# Patient Record
Sex: Male | Born: 1945 | Race: White | Hispanic: No | Marital: Married | Smoking: Former smoker
Health system: Southern US, Community
[De-identification: ages and names within clinical notes are randomized; demographics above are authoritative.]

## PROBLEM LIST (undated history)

## (undated) DIAGNOSIS — R9430 Abnormal result of cardiovascular function study, unspecified: Secondary | ICD-10-CM

## (undated) DIAGNOSIS — L4050 Arthropathic psoriasis, unspecified: Secondary | ICD-10-CM

## (undated) DIAGNOSIS — E785 Hyperlipidemia, unspecified: Secondary | ICD-10-CM

## (undated) DIAGNOSIS — M1990 Unspecified osteoarthritis, unspecified site: Secondary | ICD-10-CM

## (undated) DIAGNOSIS — Z8489 Family history of other specified conditions: Secondary | ICD-10-CM

## (undated) DIAGNOSIS — K5792 Diverticulitis of intestine, part unspecified, without perforation or abscess without bleeding: Secondary | ICD-10-CM

## (undated) DIAGNOSIS — I739 Peripheral vascular disease, unspecified: Secondary | ICD-10-CM

## (undated) DIAGNOSIS — I779 Disorder of arteries and arterioles, unspecified: Secondary | ICD-10-CM

## (undated) DIAGNOSIS — Z01810 Encounter for preprocedural cardiovascular examination: Secondary | ICD-10-CM

## (undated) DIAGNOSIS — I493 Ventricular premature depolarization: Secondary | ICD-10-CM

## (undated) DIAGNOSIS — IMO0002 Reserved for concepts with insufficient information to code with codable children: Secondary | ICD-10-CM

## (undated) DIAGNOSIS — Q159 Congenital malformation of eye, unspecified: Secondary | ICD-10-CM

## (undated) DIAGNOSIS — K429 Umbilical hernia without obstruction or gangrene: Secondary | ICD-10-CM

## (undated) DIAGNOSIS — I2510 Atherosclerotic heart disease of native coronary artery without angina pectoris: Secondary | ICD-10-CM

## (undated) HISTORY — DX: Abnormal result of cardiovascular function study, unspecified: R94.30

## (undated) HISTORY — PX: ANGIOPLASTY: SHX39

## (undated) HISTORY — DX: Diverticulitis of intestine, part unspecified, without perforation or abscess without bleeding: K57.92

## (undated) HISTORY — DX: Ventricular premature depolarization: I49.3

## (undated) HISTORY — DX: Atherosclerotic heart disease of native coronary artery without angina pectoris: I25.10

## (undated) HISTORY — DX: Encounter for preprocedural cardiovascular examination: Z01.810

## (undated) HISTORY — DX: Reserved for concepts with insufficient information to code with codable children: IMO0002

## (undated) HISTORY — DX: Arthropathic psoriasis, unspecified: L40.50

## (undated) HISTORY — DX: Unspecified osteoarthritis, unspecified site: M19.90

## (undated) HISTORY — PX: OTHER SURGICAL HISTORY: SHX169

## (undated) HISTORY — DX: Umbilical hernia without obstruction or gangrene: K42.9

## (undated) HISTORY — DX: Peripheral vascular disease, unspecified: I73.9

## (undated) HISTORY — DX: Hyperlipidemia, unspecified: E78.5

## (undated) HISTORY — DX: Disorder of arteries and arterioles, unspecified: I77.9

---

## 1988-11-17 HISTORY — PX: OTHER SURGICAL HISTORY: SHX169

## 1999-03-20 HISTORY — PX: CARDIAC CATHETERIZATION: SHX172

## 1999-12-20 ENCOUNTER — Inpatient Hospital Stay (HOSPITAL_COMMUNITY): Admission: EM | Admit: 1999-12-20 | Discharge: 1999-12-23 | Payer: Self-pay | Admitting: Emergency Medicine

## 1999-12-21 ENCOUNTER — Encounter: Payer: Self-pay | Admitting: Internal Medicine

## 2000-03-18 ENCOUNTER — Encounter: Payer: Self-pay | Admitting: Emergency Medicine

## 2000-03-18 ENCOUNTER — Inpatient Hospital Stay (HOSPITAL_COMMUNITY): Admission: EM | Admit: 2000-03-18 | Discharge: 2000-03-21 | Payer: Self-pay | Admitting: Emergency Medicine

## 2002-09-02 ENCOUNTER — Encounter: Payer: Self-pay | Admitting: Orthopedic Surgery

## 2002-09-02 ENCOUNTER — Ambulatory Visit (HOSPITAL_COMMUNITY): Admission: RE | Admit: 2002-09-02 | Discharge: 2002-09-02 | Payer: Self-pay | Admitting: Orthopedic Surgery

## 2004-12-04 ENCOUNTER — Ambulatory Visit: Payer: Self-pay | Admitting: Cardiology

## 2005-01-05 ENCOUNTER — Ambulatory Visit: Payer: Self-pay

## 2005-07-16 ENCOUNTER — Emergency Department (HOSPITAL_COMMUNITY): Admission: EM | Admit: 2005-07-16 | Discharge: 2005-07-16 | Payer: Self-pay | Admitting: *Deleted

## 2005-07-24 ENCOUNTER — Ambulatory Visit (HOSPITAL_COMMUNITY): Admission: RE | Admit: 2005-07-24 | Discharge: 2005-07-24 | Payer: Self-pay | Admitting: Internal Medicine

## 2005-12-10 ENCOUNTER — Ambulatory Visit: Payer: Self-pay | Admitting: Cardiology

## 2007-01-07 ENCOUNTER — Ambulatory Visit: Payer: Self-pay | Admitting: Cardiology

## 2007-04-04 ENCOUNTER — Ambulatory Visit (HOSPITAL_COMMUNITY): Admission: RE | Admit: 2007-04-04 | Discharge: 2007-04-04 | Payer: Self-pay | Admitting: *Deleted

## 2007-09-07 ENCOUNTER — Encounter
Admission: RE | Admit: 2007-09-07 | Discharge: 2007-09-07 | Payer: Self-pay | Admitting: Physical Medicine and Rehabilitation

## 2007-11-19 ENCOUNTER — Ambulatory Visit: Payer: Self-pay | Admitting: Cardiology

## 2007-11-26 ENCOUNTER — Ambulatory Visit (HOSPITAL_COMMUNITY): Admission: RE | Admit: 2007-11-26 | Discharge: 2007-11-26 | Payer: Self-pay | Admitting: Neurosurgery

## 2008-12-04 ENCOUNTER — Encounter: Payer: Self-pay | Admitting: Cardiology

## 2008-12-06 ENCOUNTER — Ambulatory Visit: Payer: Self-pay | Admitting: Cardiology

## 2009-01-21 ENCOUNTER — Ambulatory Visit (HOSPITAL_COMMUNITY): Admission: RE | Admit: 2009-01-21 | Discharge: 2009-01-21 | Payer: Self-pay | Admitting: Orthopedic Surgery

## 2009-04-08 ENCOUNTER — Telehealth (INDEPENDENT_AMBULATORY_CARE_PROVIDER_SITE_OTHER): Payer: Self-pay | Admitting: *Deleted

## 2009-04-11 ENCOUNTER — Encounter: Admission: RE | Admit: 2009-04-11 | Discharge: 2009-04-11 | Payer: Self-pay | Admitting: Internal Medicine

## 2009-05-03 ENCOUNTER — Telehealth (INDEPENDENT_AMBULATORY_CARE_PROVIDER_SITE_OTHER): Payer: Self-pay | Admitting: *Deleted

## 2009-05-18 ENCOUNTER — Telehealth (INDEPENDENT_AMBULATORY_CARE_PROVIDER_SITE_OTHER): Payer: Self-pay | Admitting: *Deleted

## 2009-07-20 ENCOUNTER — Telehealth: Payer: Self-pay | Admitting: Cardiology

## 2009-08-08 ENCOUNTER — Telehealth (INDEPENDENT_AMBULATORY_CARE_PROVIDER_SITE_OTHER): Payer: Self-pay | Admitting: *Deleted

## 2009-11-17 ENCOUNTER — Ambulatory Visit: Payer: Self-pay | Admitting: Cardiology

## 2009-11-22 ENCOUNTER — Encounter: Payer: Self-pay | Admitting: Cardiology

## 2009-11-23 ENCOUNTER — Telehealth (INDEPENDENT_AMBULATORY_CARE_PROVIDER_SITE_OTHER): Payer: Self-pay | Admitting: *Deleted

## 2009-11-24 ENCOUNTER — Encounter: Payer: Self-pay | Admitting: Cardiology

## 2009-11-24 ENCOUNTER — Ambulatory Visit: Payer: Self-pay

## 2009-11-24 ENCOUNTER — Encounter (HOSPITAL_COMMUNITY): Admission: RE | Admit: 2009-11-24 | Discharge: 2009-12-06 | Payer: Self-pay | Admitting: Cardiology

## 2009-11-24 ENCOUNTER — Ambulatory Visit: Payer: Self-pay | Admitting: Cardiology

## 2009-11-28 ENCOUNTER — Encounter: Payer: Self-pay | Admitting: Cardiology

## 2010-04-19 ENCOUNTER — Encounter: Payer: Self-pay | Admitting: Cardiology

## 2010-04-20 NOTE — Progress Notes (Signed)
Summary: Nuc Pre-Procedure  Phone Note Outgoing Call Call back at [facility] Phone [phone]   Call placed by: Antionette Char RN,  November 23, 2009 3:30 PM Call placed to: Patient Reason for Call: Confirm/change Appt Summary of Call: Left message with information on Myoview Information Sheet (see scanned document for details).     Nuclear Med Background Indications for Stress Test: Evaluation for Ischemia, PTCA Patency, Abnormal EKG  Indications Comments: Freq. PVC's  History: Angioplasty, Heart Catheterization, Myocardial Perfusion Study  History Comments: 2006 MPS-Neg. Ischemia, Inferior Thinning     Nuclear Pre-Procedure Cardiac Risk Factors: History of Smoking, Lipids Height (in): 69

## 2010-04-20 NOTE — Progress Notes (Signed)
Summary: Records request from [facility]  Request for records received from [facility]. Request forwarded to [employer]. Wilder Glade  May 03, 2009 5:07 PM

## 2010-04-20 NOTE — Assessment & Plan Note (Signed)
Summary: Jorge [name]    Visit Type:  Chart update Primary Drena Ham:  [name], MD   History of Present Illness: I have reviewed my recent clinical note an office note and the patient's stress nuclear scan.  He walked adequately on a treadmill.  He did have PVCs although they decreased at higher level of stress.  There were scattered PVCs.  However the rates were very slow.  At one point he did have 4 consecutive wide complex beats at a slow rate.  Nuclear scan raised the question of very slight ischemia.  The patient is not having significant symptoms.  After careful review I feel that this shows that he is stable and that we do not need to do any further intervention at this time.  I'll see him back in one year.  Allergies: 1)  ! Niacin

## 2010-04-20 NOTE — Progress Notes (Signed)
   Records request recieved from [facility] sent to [facility]  Aug 08, 2009 9:37 AM    Appended Document:  Request recieved from [facility] sent to [facility]

## 2010-04-20 NOTE — Assessment & Plan Note (Signed)
Summary: f1y  Medications Added ZETIA 10 MG TABS (EZETIMIBE) Take one tablet by mouth daily.      Allergies Added: NKDA  Visit Type:  Follow-up Primary Provider:  [name], MD  CC:  CAD.  History of Present Illness: The patient is seen for followup coronary artery disease.  He has been stable.  He did not have any chest pain or shortness of breath.  His last exercise test was in 2006.  He had some type of imaging study done recently and was not exercise test.  This was ordered by Dr. Renne Crigler and we will obtain the results.  Current Medications (verified): 1)  Plavix 75 Mg Tabs (Clopidogrel Bisulfate) .... Take One Tablet By Mouth Daily 2)  Metoprolol Succinate 25 Mg Xr24h-Tab (Metoprolol Succinate) .... Take One-Half Tablet By Mouth Daily 3)  Lipitor 80 Mg Tabs (Atorvastatin Calcium) .... Take One-Half Tablet By Mouth Daily. 4)  Aspirin 81 Mg Tbec (Aspirin) .... Take One Tablet By Mouth Daily 5)  Fish Oil   Oil (Fish Oil) .... 1200mg  Once Daily 6)  Multivitamins   Tabs (Multiple Vitamin) .... Take One Tablet By Mouth Once Daily. 7)  Zetia 10 Mg Tabs (Ezetimibe) .... Take One Tablet By Mouth Daily.  Allergies (verified): No Known Drug Allergies  Past History:  Past Medical History: HYPERLIPIDEMIA-MIXED (ICD-272.4) CAD...  PCI diagonal with redo PCI diagonal 2001..residual LAD 60%  /   Myoview 2006 no ischemic LV... normal function Carpal tunnel surgery PVCs   August, 2011    Review of Systems       Patient denies fever, chills, headache, sweats, rash, change in vision, change in hearing, chest pain, cough, nausea vomiting, urinary symptoms.  All other systems are reviewed and are negative.  Vital Signs:  Patient profile:   65 year old male Height:      69 inches Weight:      215 pounds BMI:     31.86 Pulse rate:   81 / minute BP sitting:   118 / 78  (left arm) Cuff size:   regular  Vitals Entered By: Hardin Negus, RMA (November 17, 2009 9:29 AM)  Physical  Exam  General:  patient is stable today. Head:  head is atraumatic. Eyes:  no xanthelasma. Neck:  no jugular venous attention. Chest Wall:  no chest wall tenderness.  Her Lungs:  lungs are clear respiratory effort is not labored. Heart:  cardiac exam reveals S1-S2.  No clicks or significant murmurs. Abdomen:  abdomen is soft. Msk:  no musculoskeletal deformities. Extremities:  no peripheral edema. Skin:  no skin rashes. Psych:  patient is oriented to person time and place.  Affect is normal.   Impression & Recommendations:  Problem # 1:  * PVCS The patient has multiple PVCs on his EKG today.  Historically he has not had any problems with syncope or presyncope.  We will proceed with a stress Myoview scan.  It will be important for him to walk on the treadmill so that I can see what happens to his PVCs when he walks.  Problem # 2:  HYPERLIPIDEMIA-MIXED (ICD-272.4)  His updated medication list for this problem includes:    Lipitor 80 Mg Tabs (Atorvastatin calcium) .Marland Kitchen... Take one-half tablet by mouth daily.    Zetia 10 Mg Tabs (Ezetimibe) .Marland Kitchen... Take one tablet by mouth daily. Patient's lipids are being treated.  No change in therapy.  Problem # 3:  CAD, UNSPECIFIED SITE (ICD-414.00)  His updated medication list for this  problem includes:    Plavix 75 Mg Tabs (Clopidogrel bisulfate) .Marland Kitchen... Take one tablet by mouth daily    Metoprolol Succinate 25 Mg Xr24h-tab (Metoprolol succinate) .Marland Kitchen... Take one-half tablet by mouth daily    Aspirin 81 Mg Tbec (Aspirin) .Marland Kitchen... Take one tablet by mouth daily   Coronary disease is stable.  The patient's last exercise test was in 2006.  It is time to do an exercise test to reassess.  He did have a PCI to the diagonal and 2001 with residual 60% LAD disease.  EKG is done today and reviewed by me.  The intrinsic QRS has not changed.  There is sinus bradycardia with multiple PVCs.  Stress nuclear scan walking on the treadmill will be arranged.  I will contact  him with the results.  Orders: EKG w/ Interpretation (93000) Nuclear Stress Test (Nuc Stress Test)  Patient Instructions: 1)  Your physician recommends that you continue on your current medications as directed. Please refer to the Current Medication list given to you today. 2)  Your physician wants you to follow-up in: 1 YEAR  You will receive a reminder letter in the mail two months in advance. If you don't receive a letter, please call our office to schedule the follow-up appointment. 3)  Your physician has requested that you have an exercise stress myoview.  For further information please visit [email or web address]  Please follow instruction sheet, as given.

## 2010-04-20 NOTE — Progress Notes (Signed)
   Rec. Request from [facility] forwarded to [facility] for processing [facility]  April 08, 2009 12:40 PM

## 2010-04-20 NOTE — Assessment & Plan Note (Signed)
Summary: Cardiology Nuclear Testing  Nuclear Med Background Indications for Stress Test: Evaluation for Ischemia, PTCA Patency, Abnormal EKG  Indications Comments: Freq. PVC's  History: Angioplasty, Heart Catheterization, Myocardial Perfusion Study  History Comments: 2006 MPS-Neg. Ischemia, Inferior Thinning  Symptoms: Dizziness, DOE, Fatigue, Light-Headedness, Palpitations, SOB    Nuclear Pre-Procedure Cardiac Risk Factors: History of Smoking, Lipids Caffeine/Decaff Intake: None NPO After: 10:00 PM Lungs: clear IV 0.9% NS with Angio Cath: 22g     IV Site: R Antecubital IV Started by: Irean Hong, RN Chest Size (in) 44     Height (in): 70 Weight (lb): 212 BMI: 30.53 Tech Comments: Held metoprolol 24 hrs.  Nuclear Med Study 1 or 2 day study:  1 day     Stress Test Type:  Stress Reading MD:  Olga Millers, MD     Referring MD:  J.Katz Resting Radionuclide:  Technetium 76m Tetrofosmin     Resting Radionuclide Dose:  11.0 mCi  Stress Radionuclide:  Technetium 41m Tetrofosmin     Stress Radionuclide Dose:  33.0 mCi   Stress Protocol Exercise Time (min):  8:30 min     Max HR:  136 bpm     Predicted Max HR:  155 bpm  Max Systolic BP: 202 mm Hg     Percent Max HR:  87.74 %     METS: 10.40 Rate Pressure Product:  16109    Stress Test Technologist:  Milana Na, EMT-P     Nuclear Technologist:  Domenic Polite, CNMT  Rest Procedure  Myocardial perfusion imaging was performed at rest 45 minutes following the intravenous administration of Technetium 80m Tetrofosmin.  Stress Procedure  The patient exercised for 8:30.  The patient stopped due to fatigue, sob, and denied any chest pain.  There were no significant ST-T wave changes, freq pvcs, cuplets, triplets, bigemeny, trigemeny, and NSVT.  Technetium 51m Tetrofosmin was injected at peak exercise and myocardial perfusion imaging was performed after a brief delay.  QPS Raw Data Images:  Acquisition technically good; normal  left ventricular size. Stress Images:  There is decreased uptake in the inferior wall Rest Images:  There is decreased uptake in the inferior wall less prominent compared to the stress images. Subtraction (SDS):  These findings are consistent with prior inferior infarct and mild peri-infarct ischemia. Transient Ischemic Dilatation:  .98  (Normal <1.22)  Lung/Heart Ratio:  .34  (Normal <0.45)  Quantitative Gated Spect Images QGS cine images:  not gated   Overall Impression  Exercise Capacity: Fair exercise capacity. BP Response: Hypertensive blood pressure response. Clinical Symptoms: There is dyspnea. ECG Impression: No significant ST segment change suggestive of ischemia; frequent PVCs, occasional couplets and nonsustained VT (longest 4 beats). Overall Impression: Abnormal stress nuclear study with prior inferior infarct and mild peri-infarct ischemia.  Appended Document: Cardiology Nuclear Testing Let him know that study is OK. I will see him in a year.  Appended Document: Cardiology Nuclear Testing pts wife aware

## 2010-04-20 NOTE — Progress Notes (Signed)
Summary: Records request from [facility]  Request for records received from [facility]. Request forwarded to [facility]. Dena Chavis  May 18, 2009 11:47 AM

## 2010-04-20 NOTE — Progress Notes (Signed)
Summary: stress test   Phone Note Call from Patient Call back at Work Phone [phone] Call back at [phone]   Caller: Durward Mallard Reason for Call: Talk to Nurse Summary of Call: requesting stress test for life insurance, please call back Initial call taken by: Migdalia Dk,  Jul 20, 2009 1:38 PM  Follow-up for Phone Call        pt last seen in Sept, that ov note states see back in 1 year and will consider stress test at that time, will discuss w/Dr Boykin Nearing, RN  Jul 20, 2009 4:12 PM   per Dr Myrtis Ser ok for exercise myoview, called pt and left mess to call back Meredith Staggers, RN  Jul 21, 2009 2:42 PM   spoke w/pt he is now checking w/another ins. company and will call back when wants to get stress test Meredith Staggers, RN  Jul 25, 2009 3:32 PM

## 2010-04-20 NOTE — Miscellaneous (Signed)
  Clinical Lists Changes  Observations: Added new observation of CARDIO HPI: Data was received from Dr. Carolee Rota office.  Abdominal ultrasound January, 2011.... no abdominal aortic aneurysm, slight fatty infiltration of the liver.  Ultrasound of the hands... bilateral tenosynovitis, but no synovitis, or erosions.  Findings might suggest gonorrheal infection, lupus, spondyloarthropathy, but also possibly rheumatoid arthritis (11/22/2009 14:20) Added new observation of PRIMARY MD: [name], MD (11/22/2009 14:20)      Primary Provider:  [name], MD   History of Present Illness: Data was received from Dr. Carolee Rota office.  Abdominal ultrasound January, 2011.... no abdominal aortic aneurysm, slight fatty infiltration of the liver.  Ultrasound of the hands... bilateral tenosynovitis, but no synovitis, or erosions.  Findings might suggest gonorrheal infection, lupus, spondyloarthropathy, but also possibly rheumatoid arthritis

## 2010-06-25 ENCOUNTER — Other Ambulatory Visit: Payer: Self-pay | Admitting: Cardiology

## 2010-07-26 ENCOUNTER — Other Ambulatory Visit: Payer: Self-pay | Admitting: Cardiology

## 2010-07-31 ENCOUNTER — Other Ambulatory Visit: Payer: Self-pay | Admitting: Cardiology

## 2010-08-01 NOTE — Op Note (Signed)
NAMEAASHISH, HAMM             ACCOUNT NO.:  [id number]   MEDICAL RECORD NO.:  [id number]          PATIENT TYPE:  AMB   LOCATION:  ENDO                         FACILITY:  [facility]   PHYSICIAN:  Georgiana Spinner, M.D.    DATE OF BIRTH:  September 11, 1945   DATE OF PROCEDURE:  04/04/2007  DATE OF DISCHARGE:                               OPERATIVE REPORT   PROCEDURE:  Colonoscopy.   INDICATIONS:  Colon cancer screening.   ANESTHESIA:  Fentanyl 75 mcg, Versed 5 mg.   PROCEDURE:  With the patient mildly sedated in the left lateral  decubitus position, a rectal examination was attempted.  Subsequently  the Pentax videoscopic colonoscope was inserted in the rectum and passed  under direct vision to the cecum identified by ileocecal valve and  appendiceal orifice both of which were photographed. From this point the  colonoscope was slowly withdrawn taking circumferential views of colonic  mucosa, stopping only to photograph some diverticula seen in the sigmoid  colon until we reached the rectum which appeared normal on direct and  retroflexed view.  The endoscope was straightened and withdrawn.  The  patient's vital signs, pulse oximeter remained stable.  The patient  tolerated procedure well without apparent complications.   FINDINGS:  Diverticulosis, mild of sigmoid colon otherwise unremarkable  exam.   PLAN:  Consider repeat examination in 5-10 years.           ______________________________  Georgiana Spinner, M.D.     GMO/MEDQ  D:  04/04/2007  T:  04/04/2007  Job:  [id number]

## 2010-08-01 NOTE — Assessment & Plan Note (Signed)
[name] [name]                            CARDIOLOGY OFFICE NOTE   NAME:[name] [name] HOLT                    MRN:          [id number]  DATE:11/19/2007                            DOB:          1945/07/23    [name] [name] is doing well.  He has known coronary artery disease.  I  saw him last in October 2008.  He is not having any chest pain.  He is  not having any syncope or presyncope.  He has no shortness of breath.  He remains active.  His cholesterol is followed by Dr. Renne [name].   PAST MEDICAL HISTORY:   ALLERGIES:  The patient does not tolerate NIASPAN.   MEDICATIONS:  1. Lipitor 40.  2. Plavix 75.  3. Toprol 25.  4. Aspirin 81.   OTHER MEDICAL PROBLEMS:  See the list below.   REVIEW OF SYSTEMS:  Review of systems is negative other than the HPI.   PHYSICAL EXAMINATION:  VITAL SIGNS:  Blood pressure is 129/79.  Heart  rate is 68.  GENERAL:  The patient is oriented to person, time and place.  Affect is  normal.  HEENT:  No xanthelasma.  He has normal extraocular motion.  NECK:  There are no carotid bruits.  There is no jugular venous  distention.  LUNGS:  Clear.  Respiratory effort is not labored.  CARDIAC:  An S1 with an S2.  There are no clicks or significant murmurs.  ABDOMEN:  Soft.  EXTREMITIES:  He has no peripheral edema.   EKG reveals old mild diffuse J-point elevation.   PROBLEMS:  1. Coronary artery disease with PCI in the past followed by a redo PCI      to his diagonal in 2001.  A Myoview in 2006 with no ischemia.  2. Normal LV function.  3. Hyperlipidemia, being treated.  4. Plavix therapy.  His Plavix use now is elective.  It can be stopped      for his procedure that he needs for to release his carpal tunnel.      He can then resume his Plavix.   His overall status is stable.  Aggressive approach to his cholesterol of  this of course indicated.     Jorge Abed, MD, [facility]  Electronically Signed    JDK/MedQ  DD:  11/19/2007  DT: 11/20/2007  Job #: [id number]   cc:   [name] [name]. [name] [name], M.D.

## 2010-08-01 NOTE — Op Note (Signed)
NAME:  Jorge [name], Jorge [name]             ACCOUNT NO.:  [id number]   MEDICAL RECORD NO.:  [id number]          PATIENT TYPE:  AMB   LOCATION:  SDS                          FACILITY:  [facility]   PHYSICIAN:  Payton Doughty, M.D.      DATE OF BIRTH:  1945/06/22   DATE OF PROCEDURE:  11/26/2007  DATE OF DISCHARGE:  11/26/2007                               OPERATIVE REPORT   PREOPERATIVE DIAGNOSIS:  Left carpal tunnel syndrome.   POSTOPERATIVE DIAGNOSIS:  Left carpal tunnel syndrome.   PROCEDURE:  Left carpal tunnel release.   SURGEON:  Payton Doughty, MD   ANESTHESIA:  General endotracheal.   PREPARATION:  Prepped and draped with alcohol wipe.   COMPLICATIONS:  None.   NURSE ASSISTANT:  [place].   BODY OF TEXT:  This is a 65 year old gentleman with left carpal tunnel  syndrome taken to operating room lightly sedated.  Following shave,  prep, and drape in the usual sterile fashion, skin was infiltrated with  1% lidocaine.  A 2-cm incision was made just distal to the most distal  wrist crease on line between the third and fourth rays.  Subcutaneous  fat was dissected and the transverse carpal ligament was easily  identified.  It was divided with a blade until the nerve was exposed and  then working superficial and deep to the ligament.  The ligament was  divided with the scissors down on the wrist, not proximally on the palm.  Good decompression was achieved.  Wound was irrigated.  Hemostasis  assured.  A single layer of 3-0 nylon in an interrupted vertical  mattress fashion to close the incision.  Dressed with Betadine, Telfa,  and wrapped in a fluffy wrap.  The patient returned to the recovery room  in good condition.           ______________________________  Payton Doughty, M.D.     MWR/MEDQ  D:  11/26/2007  T:  11/26/2007  Job:  [id number]

## 2010-08-01 NOTE — Assessment & Plan Note (Signed)
[facility]                            CARDIOLOGY OFFICE NOTE   NAME:Jorge [name]                    MRN:          [id number]  DATE:01/07/2007                            DOB:          [birth year]    [name] is doing very well. He has coronary disease. He had  diagonal disease and then re-do in 2002. At that time, he had a PTCA  with cutting balloon of the first diagonal with a 95% stenosis in the  body of the vessel reduced to 25% and an 80% stenosis in the ostium  reduced to 40%. If he has recurrent problems a small stent could be  placed in the body of the vessel. However, this has not been necessary.  Based on this anatomy and the fact that he has remained quite stable and  has no problems with Plavix and is able to obtain his Plavix, I continue  to keep him on Plavix. I have lowered his aspirin dose from 325 to 81,  although he has not finished his supply of 325 yet. He will change to 81  at some point.   PAST MEDICAL HISTORY:   ALLERGIES:  NIACIN.   MEDICATIONS:  1. Aspirin.  2. Lipitor.  3. Plavix.  4. Toprol XL.   OTHER MEDICAL PROBLEMS:  See the list below.   REVIEW OF SYSTEMS:  He feels fine and has no significant complaints.   PHYSICAL EXAMINATION:  Blood pressure 130/72 with a pulse of 67. The  patient is oriented to person, time and place and his affect is normal.  HEENT: Reveals no xanthelasma. He has normal extraocular motion. There  are no carotid bruits. There is no jugular venous distention.  LUNGS:  Are clear. Respiratory effort is not labored.  CARDIAC: Reveals an S1, with an S2. He has no clicks or significant  murmurs.  ABDOMEN: Soft. There are no masses or bruits. He has normal bowel  sounds.  He has no peripheral edema. He has 2+ distal pulses. There are no  musculoskeletal deformities.   EKG: Reveals sinus rhythm. He does have scattered PVCs and we have seen  these in the past.   PROBLEM LIST:  1.  Coronary disease with interventions in the past and re-do to his      diagonal in 2001. Myoview scan in 2006 revealed no significant      ischemia.  2. Normal left ventricular function.  3. Hyperlipidemia, on medications. With current guidelines, we would      like for his LDL to be in the range of 70.  4. Plavix therapy. As outlined above, he will remain on Plavix      longterm at this point and his aspirin can be reduced to 81 mg.   I will see him back in one year.     Luis Abed, MD, [facility]  Electronically Signed    JDK/MedQ  DD: 01/07/2007  DT: 01/07/2007  Job #: [id number]   cc:   Soyla Murphy. Renne Crigler, M.D.

## 2010-08-04 NOTE — Cardiovascular Report (Signed)
[place]. [facility]  Patient:    Jorge [name], Jorge [name]                  MRN: [id number] Proc. Date: 12/22/99 Adm. Date:  [id number] Attending:  Pricilla Riffle CC:         Soyla Murphy. Renne Crigler, M.D.  Elvina Sidle, M.D.  Luis Abed, M.D. [facility]   Cardiac Catheterization  PROCEDURES PERFORMED:  Cardiac catheterization, rotational atherectomy, cutting balloon angioplasty, and intravascular ultrasound.  CLINICAL HISTORY:  [name] is 65 years old and had recent onset of chest pain suggestive of angina.  He was hospitalized at [facility] after being seen by Dr. Milus Glazier and had a Cardiolite scan which showed anterolateral and apical ischemia.  He was scheduled for evaluation with angiography.  PROCEDURE:  The procedure was performed via the right femoral artery using arterial sheath and 6-French preformed coronary catheters.  A front wall arterial puncture was performed and Omnipaque contrast was used.  After completion of the diagnostic study, we made the decision to intervene on the diagonal branch of the LAD.  We switched to Hexabrix contrast.  The was present for the was given weight-adjusted heparin to prolong the ACT to greater than 200 seconds and was given double-bolus Integrilin and infusion. We used an 8-French JL-4 guiding catheter and initially went in with a rotofloppy wire and were able to pass this into the diagnonal branch without too much difficulty.  Before we started any bur runs, he became hypotensive and bradycardic and we had to give Atropine and Dopamine and was placed a temporary transvenous pacemaker via the right femoral vein.  His hemodynamics and rhythms stabilized and we were able to do bur runs with a 1.5 bur.  We performed a total of nine runs at approximately 145,000 rpm for 9 seconds to 15 seconds across the ostium and proximal lesion in the diagonal branch of the LAD.  We then removed the rotofloppy wire and  passed a new long BMW wire down the diagonal branch and we went in with a 2.5 mm x 15 mm cutting balloon and performed a total of four inflations of up to 10 atm x 42 seconds, covering both the ostium and the proximal lesion in the diagonal branch of the LAD.  We then rerouted the wire down the LAD and performed an IVUS run with an Atlantis catheter.  This demonstrated that there was a satisfactory lumen just distal to the diagonal branch and based on this data, we elected not to intervene on the LAD.  The patient tolerated the procedure well and left the laboratory in satisfactory condition.  RESULTS: 1.  The left main coronary artery was free of significant disease.  2.  The left anterior descending artery gave rise to a large septal     perforator, a large diagonal branch, and a second large diagonal branch     which arose distally.  There was 80% ostial narrowing in the diagonal     branch and 95% proximal narrowing in the diagonal branch.  There was     moderately heavy calcification in the diagonal branch, as well as in the     LAD.  There was 60% narrowing just distal to the diagonal branch in the     LAD, with irregularities in the proximal LAD.  3.  The circumflex artery gave rise to a large intermediate branch, a small     marginal branch, and two posterolateral branches.  This vessel had some     irregularities, but no major obstruction.  4.  The right coronary artery was a small to moderate sized vessel that gave     rise to a conus branch, a right ventricular branch, and a posterior     descending branch.  There was 40% proximal and 40% mid stenosis in the     right coronary artery.  LEFT VENTRICULOGRAM:  The left ventriculogram performed in the RAO projection showed good wall motion with no areas of hypokinesis.  The estimated ejection fraction was 60%.  Following rotational atherectomy and cutting balloon angioplasty of the ostial lesion in the diagonal branch of the  LAD, the stenosis improved from 80% to 40%.  Following rotational atherectomy and cutting balloon angioplasty of the proximal lesion in the diagonal branch, the stenosis improved from 95% to less than 10%.  The IVUS run on the LAD showed a distal reference lumen beyond the diagonal branch of 2.5 mm x 3.5 mm.  The lumen just distal to the diagonal branch was 2.1 mm x 2.1 mm in a vessel that was approximately 3.5 mm x 3.5 mm.  Proximal to the diagonal branch the lumen was larger at 2.8 mm x 2.7 mm just proximal to the diagonal branch.  HEMODYNAMIC DATA:  The aortic pressure was 119/72, with a mean of 93.  Left ventricular pressure was 119/11.  CONCLUSIONS: 1.  Coronary artery disease with 80% ostial and 95% proximal stenosis in the     diagonal branch of the left anterior descending artery, 60% stenosis in     the proximal left anterior descending artery just after the diagonal     branch, mild irregularities in the circumflex system, and 40% proximal and     40% mid stenosis in the right coronary artery with normal left ventricular     function. 2.  Successful rotational atherectomy and cutting balloon angioplasty of the     diagonal branch of the left anterior descending artery with improvement in     the ostial stenosis from 80% to 40% and improvement in the proximal     stenosis from 95% to less than 10%. 3.  Residual disease in the left anterior descending artery with a lumen of     2.1 mm x 2.1 mm distal to the diagonal branch and estimated angiographic     stenosis of 60%.  DISPOSITION:  The was present for the was returned to the postangioplasty unit for further observation. DD:  12/22/99 TD:  12/23/99 Job: [id number]

## 2010-08-04 NOTE — Discharge Summary (Signed)
[place]. [facility]  Patient:    Jorge [name], Jorge [name] Visit Number: [id number] MRN: [id number]          Service Type: MED Location: [id number] Attending Physician:  Talitha Givens Dictated by:   Abelino Derrick, P.A.-C. [facility] Admit Date:  03/18/2000 Disc. Date: 03/21/00   CC:         Luis Abed, M.D. [facility]             Soyla Murphy. Renne Crigler, M.D.                           Discharge Summary  DISCHARGE DIAGNOSES: 1. Coronary artery disease, status post high-speed rotational atherectomy    to the diagonal on December 22, 1999, with redo this admission. 2. Dyslipidemia, enrolled in the PROVE-IT trial.  HISTORY OF PRESENT ILLNESS:  The patient is a 65 year old male followed by Dr. Luis Abed and Dr. Soyla Murphy. Pharr, with a history of coronary artery disease as noted.  He developed substernal chest pain typical for angina.  He was admitted on March 18, 2000, for further evaluation.  Troponins and CPKs were negative.  He was started on Plavix and Toprol.  He was set up for a cardiac catheterization which was done on March 20, 2000, by Dr. Loraine Leriche Pulsipher.  This revealed an 80% and a 95% restenosis in the diagonal.  These were dilated using a cutting balloon.  He was put on Integrilin for 18 hours post-procedure.  He does mention that if he restenosed he may consider trying to place a stent.  DISPOSITION:  The patient is ambulated on March 21, 2000, and is ready for discharge.  DISCHARGE MEDICATIONS: 1. Coated aspirin q.d. 2. Toprol XL 25 mg 1/2 tablet q.d. 3. Nitroglycerin sublingual p.r.n. 4. PROVE-IT study drug.  LABORATORY DATA:  White count 9.3, hemoglobin 15.7, hematocrit 43.4, platelets 172.  Sodium 138, potassium 3.7, BUN 20, creatinine 1.0.  Liver function tests are normal.  Enzymes are negative.  TSH 2.66.  Chest x-ray showed no active disease.  Electrocardiogram showed a sinus bradycardia, nonspecific ST changes.  CONDITION ON  DISCHARGE:  The patient is discharged in stable condition.  FOLLOWUP:  The patient will follow up with Dr. Myrtis Ser.  The office will contact him regarding this. Dictated by:   Abelino Derrick, P.A.-C. [facility] Attending Physician:  Talitha Givens DD:  03/21/00 TD:  03/21/00 Job: [id number]

## 2010-08-04 NOTE — Discharge Summary (Signed)
[place]. [facility]  Patient:    Jorge [name], Jorge [name]                  MRN: [id number] Adm. Date:  [id number] Disc. Date: 12/23/99 Attending:  Pricilla Riffle Dictator:   Abelino Derrick, P.A.C. [facility] CC:         Soyla Murphy. Renne Crigler, M.D.   Discharge Summary  DISCHARGE DIAGNOSIS:  Coronary artery disease, status post diagonal high speed rotational atherectomy and angioplasty this admission.  HOSPITAL COURSE:  [name] is a 65 year old male with no prior cardiac history.  He smokes a pack and a half a day, but quit three years ago.  He presented on December 20, 1999, with chest pain consistent with unstable angina. The patient had no EKG changes.  Initial enzymes were negative.  He was set up for a stress Cardiolite.  This was abnormal and the patient was set up for cardiac catheterization which was done on December 22, 1999.  This revealed a 40% RCA, normal, but irregular circumflex, 80% diagonal lesion, and a 95% proximal to mid diagonal lesion.  He underwent high speed rotational atherectomy and angioplasty to both lesions with good final result.  The LAD had a 60% narrowing after the first diagonal.  Dr. Juanda Chance did not intervene on this.  The patient tolerated the procedure well and was stable for discharge on December 23, 1999.  DISCHARGE MEDICATIONS: 1. Coated aspirin once a day. 2. Nitroglycerin sublingual p.r.n.  LABORATORY DATA:  White count 7.5, hemoglobin 14.8, hematocrit 40.2, platelets 170.  INR 0.9, sodium 138, potassium 3.7, BUN 21, creatinine 1.0.  Lipid profile shows an HDL of 33 and LDL of 67.  TSH is 0.89.  EKG shows normal sinus rhythm 62.  Chest x-ray showed no acute disease.  DISPOSITION:  The patient was discharged in stable condition and will follow up with Dr. Myrtis Ser in about two weeks. DD:  12/23/99 TD:  12/23/99 Job: [id number]

## 2010-08-04 NOTE — Assessment & Plan Note (Signed)
[facility]                              CARDIOLOGY OFFICE NOTE   NAME:Clasby, JAYAN RAYMUNDO                    MRN:          [id number]  DATE:12/10/2005                            DOB:          June 17, 1945    Mr. Teague is doing very well.  He is not having any significant chest  pain.  He had an intervention in the past.  It was a diagonal and then a  redo to his diagonal in 2001.  When I saw him in 2006 we decided to proceed  with an exercise test as it had been 5 years.  He walked well on the  treadmill.  He did have some PVCs.  There was no definite ischemia.  The  ejection fraction was in the 54% range.  There was no definite ischemia.  Since that time he has done well.  He is not having any chest pain or  significant shortness of breath.   PAST MEDICAL HISTORY:   ALLERGIES:  No known drug allergies.   MEDICATIONS:  1. Aspirin 325.  2. Lipitor 40.  3. Plavix 75.  4. Metoprolol XL 12.5 mg daily.   OTHER MEDICAL PROBLEMS:  See the list below.   REVIEW OF SYSTEMS:  He has no complaints today.  His review of systems is  negative.   PHYSICAL EXAM:  Blood pressure is 126/68, pulse is 60.  Respirations are 18.  The patient is oriented to person, time, and place, and his affect is  normal.  LUNGS:  Clear.  Respiratory effort is not labored.  HEENT:  No xanthelasma.  He has normal extraocular motion.  There are no carotid bruits.  There is no jugular venous distension.  CARDIAC:  Exam reveals an S1, S2.  There are no clicks or significant  murmurs.  ABDOMEN:  Soft.  There are no masses or bruits.  There is no significant peripheral edema.  He has 2+ distal pulses.   EKG reveals no significant abnormality.  I talked to him about his labs and  he informs me that Dr. Renne Crigler is following his cholesterol.   PROBLEMS:  1. Coronary disease.  The patient had an intervention in the past and a      redo to his diagonal in 2001.  Myoview scan in 2006  revealed no      significant ischemia.  2. Normal left ventricular function.  3. Hyperlipidemia on medications, followed by Dr. Renne Crigler.  4. Plavix therapy.  He is on Plavix long-term at this point.  His aspirin      could be lowered to 81 mg.   I will see him back for cardiology followup in 1 year.                               Luis Abed, MD, [facility]    JDK/MedQ  DD:  12/10/2005  DT:  12/11/2005  Job #:  [id number]   cc:   Soyla Murphy. Renne Crigler, M.D.

## 2010-08-26 ENCOUNTER — Other Ambulatory Visit: Payer: Self-pay | Admitting: Cardiology

## 2010-10-16 ENCOUNTER — Other Ambulatory Visit: Payer: Self-pay | Admitting: *Deleted

## 2010-10-16 MED ORDER — CLOPIDOGREL BISULFATE 75 MG PO TABS: 75.0000 mg | ORAL_TABLET | Freq: Every day | ORAL | Status: DC

## 2010-12-06 ENCOUNTER — Encounter: Payer: Self-pay | Admitting: Cardiology

## 2010-12-06 DIAGNOSIS — I493 Ventricular premature depolarization: Secondary | ICD-10-CM | POA: Insufficient documentation

## 2010-12-06 DIAGNOSIS — R9430 Abnormal result of cardiovascular function study, unspecified: Secondary | ICD-10-CM | POA: Insufficient documentation

## 2010-12-06 DIAGNOSIS — I2510 Atherosclerotic heart disease of native coronary artery without angina pectoris: Secondary | ICD-10-CM | POA: Insufficient documentation

## 2010-12-06 DIAGNOSIS — E785 Hyperlipidemia, unspecified: Secondary | ICD-10-CM | POA: Insufficient documentation

## 2010-12-08 ENCOUNTER — Ambulatory Visit (INDEPENDENT_AMBULATORY_CARE_PROVIDER_SITE_OTHER): Payer: Medicare Other | Admitting: Cardiology

## 2010-12-08 ENCOUNTER — Encounter: Payer: Self-pay | Admitting: Cardiology

## 2010-12-08 VITALS — BP 110/56 | HR 67 | Wt 214.4 lb

## 2010-12-08 DIAGNOSIS — I4949 Other premature depolarization: Secondary | ICD-10-CM

## 2010-12-08 DIAGNOSIS — I493 Ventricular premature depolarization: Secondary | ICD-10-CM

## 2010-12-08 DIAGNOSIS — I2510 Atherosclerotic heart disease of native coronary artery without angina pectoris: Secondary | ICD-10-CM

## 2010-12-08 DIAGNOSIS — L4050 Arthropathic psoriasis, unspecified: Secondary | ICD-10-CM | POA: Insufficient documentation

## 2010-12-08 DIAGNOSIS — R0989 Other specified symptoms and signs involving the circulatory and respiratory systems: Secondary | ICD-10-CM

## 2010-12-08 NOTE — Progress Notes (Signed)
HPI Patient is seen today for followup coronary artery disease.  I saw him last September, 2011.  He was stable.  He did have PVCs.  Prior to that visit his last nuclear exercise test had been in 2006.  A followup scan was done in September, 2011.  There was question of very slight ischemia or not a significant finding.  His PVCs diminished markedly with exercise.  I felt he was stable.  He has done well since last year.  Is not having any chest pain syncope or presyncope.  As part of today's evaluation I have reviewed my old records incompletely updated the new EMR.  The patient does mention that he has a new problem of psoriatic arthritis.  He is being seen by rheumatology. Allergies  Allergen Reactions  . Niacin     REACTION: Reaction not known    Current Outpatient Prescriptions  Medication Sig Dispense Refill  . aspirin 81 MG tablet Take 81 mg by mouth daily.        Marland Kitchen atorvastatin (LIPITOR) 40 MG tablet Take 40 mg by mouth daily.        . clopidogrel (PLAVIX) 75 MG tablet Take 1 tablet (75 mg total) by mouth daily.  30 tablet  3  . Ezetimibe (ZETIA PO) Take 1 tablet by mouth daily.        . fish oil-omega-3 fatty acids 1000 MG capsule Take 2 g by mouth daily.        . metoprolol succinate (TOPROL-XL) 25 MG 24 hr tablet TAKE  1/2 TABLET BY MOUTH DAILY  90 tablet  0  . Multiple Vitamin (MULTIVITAMIN) capsule Take 1 capsule by mouth daily.        . prednisoLONE 5 MG TABS Take 5 mg by mouth daily.          History   Social History  . Marital Status: Married    Spouse Name: N/A    Number of Children: N/A  . Years of Education: N/A   Occupational History  . Not on file.   Social History Main Topics  . Smoking status: Former Smoker    Quit date: 12/08/1994  . Smokeless tobacco: Not on file  . Alcohol Use: Not on file  . Drug Use: Not on file  . Sexually Active: Not on file   Other Topics Concern  . Not on file   Social History Narrative  . No narrative on file    No  family history on file.  Past Medical History  Diagnosis Date  . Dyslipidemia   . CAD (coronary artery disease)     PCI d iagonal 2001, residual 60% LAD /  Nuclear 2006, no ischemia  . Ejection fraction     EF normal,  . PVC's (premature ventricular contractions)     August, 2011  . Carotid bruit     Left carotid bruit, September, 2012    No past surgical history on file.  ROS  Patient denies fever, chills, headache, sweats, rash, change in vision, change in hearing, chest pain, cough, nausea vomiting, urinary symptoms.  All of the systems are reviewed and are negative. PHYSICAL EXAM Patient is oriented to person time and place.  Affect is normal.  Head is atraumatic.  There is no xanthelasma.  Lungs are clear.  Respiratory effort is nonlabored.  Cardiac exam reveals S1 and S2.  He does have premature beats on his physical exam.  Abdomen is soft.  There is no peripheral edema.  There  is no musculoskeletal deformities.  There are no skin rashes. Filed Vitals:   12/08/10 0947  BP: 110/56  Pulse: 67  Weight: 214 lb 6.4 oz (97.251 kg)   EKG is done today and reviewed by me.  He does have ventricular bigeminy at rest.  The QRS is unchanged.  ASSESSMENT & PLAN

## 2010-12-08 NOTE — Patient Instructions (Addendum)
Your physician recommends that you schedule a follow-up appointment in: YEAR WITH DR KATZ  Your physician recommends that you continue on your current medications as directed. Please refer to the Current Medication list given to you today. Your physician has requested that you have a carotid duplex. This test is an ultrasound of the carotid arteries in your neck. It looks at blood flow through these arteries that supply the brain with blood. Allow one hour for this exam. There are no restrictions or special instructions. DX BRUIT  

## 2010-12-08 NOTE — Assessment & Plan Note (Signed)
Coronary disease is stable.  The patient is not having any significant symptoms.  His nuclear study in September, 2011 raised the question of very slight ischemia.  No further workup is needed.  Secondary prevention of course is in order.

## 2010-12-08 NOTE — Assessment & Plan Note (Signed)
There is a soft carotid bruit today.  The patient has not had a carotid Doppler in greater than 5 years.  Carotid Doppler will be scheduled.  I will be in touch with him with the results.  Otherwise I'll see him back in one year.

## 2010-12-08 NOTE — Assessment & Plan Note (Signed)
We note the patient's PVCs decreased with stress.  No further workup at this time.

## 2011-01-05 ENCOUNTER — Encounter (INDEPENDENT_AMBULATORY_CARE_PROVIDER_SITE_OTHER): Payer: Medicare Other | Admitting: *Deleted

## 2011-01-05 DIAGNOSIS — I6529 Occlusion and stenosis of unspecified carotid artery: Secondary | ICD-10-CM

## 2011-01-05 DIAGNOSIS — R0989 Other specified symptoms and signs involving the circulatory and respiratory systems: Secondary | ICD-10-CM

## 2011-01-17 ENCOUNTER — Telehealth: Payer: Self-pay | Admitting: Cardiology

## 2011-01-17 NOTE — Telephone Encounter (Signed)
2nd message  Pt is calling about test results  Please call

## 2011-01-17 NOTE — Telephone Encounter (Signed)
Pt was notified of results of carotid doppler

## 2011-01-17 NOTE — Progress Notes (Signed)
Pt was notified.  

## 2011-01-17 NOTE — Telephone Encounter (Signed)
New message  Pt is calling about test results  Please call back

## 2011-02-03 ENCOUNTER — Other Ambulatory Visit: Payer: Self-pay | Admitting: Cardiology

## 2011-02-18 ENCOUNTER — Other Ambulatory Visit: Payer: Self-pay | Admitting: Cardiology

## 2011-07-11 ENCOUNTER — Telehealth: Payer: Self-pay | Admitting: Cardiology

## 2011-07-11 NOTE — Telephone Encounter (Signed)
All Cardiac faxed to [facility] @ [phone] 07/10/11/KM

## 2011-07-12 ENCOUNTER — Encounter (INDEPENDENT_AMBULATORY_CARE_PROVIDER_SITE_OTHER): Payer: Self-pay | Admitting: Surgery

## 2011-07-17 ENCOUNTER — Encounter (INDEPENDENT_AMBULATORY_CARE_PROVIDER_SITE_OTHER): Payer: Self-pay | Admitting: Surgery

## 2011-07-26 ENCOUNTER — Encounter (INDEPENDENT_AMBULATORY_CARE_PROVIDER_SITE_OTHER): Payer: Self-pay | Admitting: Surgery

## 2011-07-31 ENCOUNTER — Ambulatory Visit (INDEPENDENT_AMBULATORY_CARE_PROVIDER_SITE_OTHER): Payer: Medicare Other | Admitting: Surgery

## 2011-07-31 ENCOUNTER — Encounter (INDEPENDENT_AMBULATORY_CARE_PROVIDER_SITE_OTHER): Payer: Self-pay | Admitting: Surgery

## 2011-07-31 ENCOUNTER — Encounter (INDEPENDENT_AMBULATORY_CARE_PROVIDER_SITE_OTHER): Payer: Self-pay | Admitting: General Surgery

## 2011-07-31 VITALS — BP 118/64 | HR 60 | Temp 97.2°F | Resp 14 | Ht 69.0 in | Wt 215.2 lb

## 2011-07-31 DIAGNOSIS — K4090 Unilateral inguinal hernia, without obstruction or gangrene, not specified as recurrent: Secondary | ICD-10-CM | POA: Insufficient documentation

## 2011-07-31 DIAGNOSIS — K429 Umbilical hernia without obstruction or gangrene: Secondary | ICD-10-CM

## 2011-07-31 HISTORY — DX: Umbilical hernia without obstruction or gangrene: K42.9

## 2011-07-31 NOTE — Progress Notes (Signed)
Addended by: Wynona Luna on: 07/31/2011 05:12 PM   Modules accepted: Orders

## 2011-07-31 NOTE — Patient Instructions (Signed)
After we receive cardiac clearance from Dr. Myrtis Ser, we will call you to schedule your surgery.  Stop your Plavix and Aspirin 5 days prior to surgery.

## 2011-07-31 NOTE — Progress Notes (Signed)
Patient ID: Jorge [name], male   DOB: 08-Feb-1946, 66 y.o.   MRN: [id number]  Chief Complaint  Patient presents with  . New Evaluation    Eval Umb and Ing hernia    HPI [name] is a 66 y.o. male.  Referred by Dr. Renne Crigler for evaluation of umbilical and left inguinal hernias HPI 66 yo male who is quite active presents with a four year of a protruding umbilicus.  He has had one episode of painful incarceration, but he was able to reduce this himself.  About 8 months ago, he felt a pain in his left groin.  This pain has persisted and he was told that he had a hernia.  He denies any swelling in this area.  He denies any obstructive symptoms.  Past Medical History  Diagnosis Date  . Dyslipidemia   . CAD (coronary artery disease)     PCI d iagonal 2001, residual 60% LAD /  Nuclear 2006, no ischemia  . Ejection fraction     EF normal,  . PVC's (premature ventricular contractions)     August, 2011  . Carotid bruit     Left carotid bruit, September, 2012  . Psoriatic arthritis     2012  . Arthritis   . Umbilical hernia 07/31/2011    Past Surgical History  Procedure Date  . Carpel tunnel 2009/2010  . Angioplasty 12/1999 and 03/2000    Family History  Problem Relation Age of Onset  . Cancer Mother     uterine  . Cancer Sister     Breast    Social History History  Substance Use Topics  . Smoking status: Former Smoker    Quit date: 12/08/1994  . Smokeless tobacco: Not on file  . Alcohol Use: Yes     1 drink per day    Allergies  Allergen Reactions  . Niacin     REACTION: Reaction not known    Current Outpatient Prescriptions  Medication Sig Dispense Refill  . aspirin 81 MG tablet Take 81 mg by mouth [name].        Marland Kitchen atorvastatin (LIPITOR) 40 MG tablet Take 40 mg by mouth [name].        . clopidogrel (PLAVIX) 75 MG tablet TAKE 1 TABLET BY MOUTH EVERY DAY  30 tablet  9  . Ezetimibe (ZETIA PO) Take 1 tablet by mouth [name].        . fish oil-omega-3 fatty acids  1000 MG capsule Take 2 g by mouth [name].        Marland Kitchen ibuprofen (ADVIL,MOTRIN) 200 MG tablet Take 200 mg by mouth every 6 (six) hours as needed.      . metoprolol succinate (TOPROL-XL) 25 MG 24 hr tablet TAKE  1/2 TABLET BY MOUTH [name]  90 tablet  1  . Multiple Vitamin (MULTIVITAMIN) capsule Take 1 capsule by mouth [name].        . naproxen sodium (ANAPROX) 220 MG tablet Take 220 mg by mouth 2 (two) times [name] with a meal.        Review of Systems Review of Systems  Constitutional: Negative for fever, chills and unexpected weight change.  HENT: Negative for hearing loss, congestion, sore throat, trouble swallowing and voice change.   Eyes: Negative for visual disturbance.  Respiratory: Positive for cough. Negative for wheezing.   Cardiovascular: Negative for chest pain, palpitations and leg swelling.  Gastrointestinal: Negative for nausea, vomiting, abdominal pain, diarrhea, constipation, blood in stool, abdominal distention, anal bleeding and  rectal pain.  Genitourinary: Negative for hematuria and difficulty urinating.  Musculoskeletal: Positive for arthralgias.  Skin: Negative for rash and wound.  Neurological: Positive for headaches. Negative for seizures, syncope and weakness.  Hematological: Negative for adenopathy. Does not bruise/bleed easily.  Psychiatric/Behavioral: Negative for confusion.    Blood pressure 118/64, pulse 60, temperature 97.2 F (36.2 C), temperature source Temporal, resp. rate 14, height 5\' 9"  (1.753 m), weight 215 lb 3.2 oz (97.614 kg).  Physical Exam Physical Exam WDWN in NAD HEENT:  EOMI, sclera anicteric Neck:  No masses, no thyromegaly Lungs:  CTA bilaterally; normal respiratory effort CV:  Regular rate and rhythm; no murmurs Abd:  +bowel sounds, soft, non-tender, 3 cm palpable, partially reducible umbilical hernia GU:  Bilateral descended testes.  No testicular masses.  No sign of RIH.  Easily palpable, reducible LIH Ext:  Well-perfused; no edema Skin:   Warm, dry; no sign of jaundice  Data Reviewed None  Assessment    1.  Left inguinal hernia 2.  Umbilical hernia     Plan    Left inguinal hernia repair with mesh and umbilical hernia repair with mesh.  The surgical procedures  has been discussed with the patient.  Potential risks, benefits, alternative treatments, and expected outcomes have been explained.  All of the patient's questions at this time have been answered.  The likelihood of reaching the patient's treatment goal is good.  The patient understand the proposed surgical procedure and wishes to proceed.  We will first obtain cardiac clearance from Dr. Myrtis Ser.         Damontae Loppnow K. 07/31/2011, 5:06 PM

## 2011-08-02 ENCOUNTER — Encounter: Payer: Self-pay | Admitting: Cardiology

## 2011-08-15 ENCOUNTER — Encounter: Payer: Self-pay | Admitting: Cardiology

## 2011-08-15 DIAGNOSIS — I739 Peripheral vascular disease, unspecified: Secondary | ICD-10-CM

## 2011-08-15 DIAGNOSIS — I779 Disorder of arteries and arterioles, unspecified: Secondary | ICD-10-CM | POA: Insufficient documentation

## 2011-08-16 ENCOUNTER — Encounter: Payer: Self-pay | Admitting: Cardiology

## 2011-08-16 ENCOUNTER — Ambulatory Visit (INDEPENDENT_AMBULATORY_CARE_PROVIDER_SITE_OTHER): Payer: Medicare Other | Admitting: Cardiology

## 2011-08-16 VITALS — BP 120/56 | HR 80 | Ht 70.0 in | Wt 212.0 lb

## 2011-08-16 DIAGNOSIS — Z01810 Encounter for preprocedural cardiovascular examination: Secondary | ICD-10-CM

## 2011-08-16 DIAGNOSIS — I2510 Atherosclerotic heart disease of native coronary artery without angina pectoris: Secondary | ICD-10-CM

## 2011-08-16 DIAGNOSIS — I779 Disorder of arteries and arterioles, unspecified: Secondary | ICD-10-CM

## 2011-08-16 DIAGNOSIS — I493 Ventricular premature depolarization: Secondary | ICD-10-CM

## 2011-08-16 DIAGNOSIS — I4949 Other premature depolarization: Secondary | ICD-10-CM

## 2011-08-16 NOTE — Patient Instructions (Signed)
Your physician wants you to follow-up in: 1 year.  You will receive a reminder letter in the mail two months in advance. If you don't receive a letter, please call our office to schedule the follow-up appointment.  You have been cleared from a cardiac standpoint for hernia surgery with Dr Corliss Skains.  No further work up is necessary.

## 2011-08-16 NOTE — Assessment & Plan Note (Signed)
The patient's cardiac status is stable. He is cleared for his hernia surgery. He has a high exercise level. He's not having any symptoms. He's had a stress test within the past 5 years showing no marked ischemia. In addition he has PVCs. I have documented in multiple places that this is old. The PVCs decreased with exercise. They do not represent a significant problem for him. He is cleared for his surgery.

## 2011-08-16 NOTE — Progress Notes (Signed)
HPI  Patient is seen today to followup coronary disease. He is also seen for cardiac clearance for a hernia repair. He has known coronary disease. His last nuclear scan was 2011. It showed no marked ischemia. He has normal left jugular function. It is of note that he has PVCs. These have been noted over many years. It has been shown repeatedly that his PVCs  decrease with stress.This was shown on his nuclear scan in 2011. 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  Allergies  Allergen Reactions  . Niacin     REACTION: Reaction not known    Current Outpatient Prescriptions  Medication Sig Dispense Refill  . aspirin 81 MG tablet Take 81 mg by mouth daily.        Marland Kitchen atorvastatin (LIPITOR) 40 MG tablet Take 40 mg by mouth daily.        . clopidogrel (PLAVIX) 75 MG tablet TAKE 1 TABLET BY MOUTH EVERY DAY  30 tablet  9  . Ezetimibe (ZETIA PO) Take 1 tablet by mouth daily.        . fish oil-omega-3 fatty acids 1000 MG capsule Take 2 g by mouth daily.        Marland Kitchen ibuprofen (ADVIL,MOTRIN) 200 MG tablet Take 200 mg by mouth every 6 (six) hours as needed.      . metoprolol succinate (TOPROL-XL) 25 MG 24 hr tablet TAKE  1/2 TABLET BY MOUTH DAILY  90 tablet  1  . Multiple Vitamin (MULTIVITAMIN) capsule Take 1 capsule by mouth daily.        . naproxen sodium (ANAPROX) 220 MG tablet Take 220 mg by mouth 2 (two) times daily as needed.         History   Social History  . Marital Status: Married    Spouse Name: N/A    Number of Children: N/A  . Years of Education: N/A   Occupational History  . Not on file.   Social History Main Topics  . Smoking status: Former Smoker    Quit date: 12/08/1994  . Smokeless tobacco: Not on file  . Alcohol Use: Yes     1 drink per day  . Drug Use: No  . Sexually Active: Not on file   Other Topics Concern  . Not on file   Social History Narrative  . No narrative on file    Family History  Problem Relation Age of Onset  . Cancer Mother     uterine    . Cancer Sister     Breast    Past Medical History  Diagnosis Date  . Dyslipidemia   . CAD (coronary artery disease)     PCI d iagonal 2001, residual 60% LAD /  Nuclear 2006, no ischemia  . Ejection fraction     EF normal,  . PVC's (premature ventricular contractions)     Frequent PVCs noted over the years., These always decrease with exercise. This is documented  . Carotid artery disease     Doppler, October, 2012, 0-39% bilateral stenoses.  . Psoriatic arthritis     2012  . Arthritis   . Umbilical hernia 07/31/2011    Past Surgical History  Procedure Date  . Carpel tunnel 2009/2010  . Angioplasty 12/1999 and 03/2000    ROS Patient denies fever, chills, headache, sweats, rash, change in vision, change in hearing, chest pain, cough, nausea vomiting, urinary symptoms. All other systems are reviewed and are negative.  PHYSICAL EXAM  Patient is quite stable.  There is no jugulovenous distention. Lungs are clear. Respiratory effort is nonlabored. Cardiac exam reveals S1 and S2. There no clicks or significant murmurs. Abdomen is soft. There is no peripheral edema. There no musculoskeletal deformities. There are no skin rashes.  Filed Vitals:   08/16/11 1343  BP: 120/56  Pulse: 80  Height: 5\' 10"  (1.778 m)  Weight: 212 lb (96.163 kg)   EKG is done today and reviewed by me. The underlying QRS is normal. The patient does have PVCs. He had a brief period of bigeminy.  ASSESSMENT & PLAN

## 2011-08-16 NOTE — Assessment & Plan Note (Signed)
Cardiac status is stable. He had a nuclear scan in 2011 with no significant ischemia. He is very active and has a high level of exercise. He has no symptoms. No further workup is needed.

## 2011-08-16 NOTE — Assessment & Plan Note (Signed)
Carotid Doppler in 2012 revealed mild carotid disease. This is to be followed over time.

## 2011-08-16 NOTE — Assessment & Plan Note (Signed)
The patient has PVCs on his EKG today. This has been seen repeatedly over the years. His PVCs decreased when he walked on the treadmill with nuclear study in 2011. I walked him in the office today and when his heart rate went up his PVCs decreased. He does not need any further workup for these PVCs.

## 2011-08-23 ENCOUNTER — Other Ambulatory Visit (INDEPENDENT_AMBULATORY_CARE_PROVIDER_SITE_OTHER): Payer: Self-pay | Admitting: Surgery

## 2011-08-23 DIAGNOSIS — Q159 Congenital malformation of eye, unspecified: Secondary | ICD-10-CM

## 2011-08-23 HISTORY — DX: Congenital malformation of eye, unspecified: Q15.9

## 2011-08-24 ENCOUNTER — Ambulatory Visit (HOSPITAL_COMMUNITY)
Admission: RE | Admit: 2011-08-24 | Discharge: 2011-08-24 | Disposition: A | Discharge status: Home / Self Care | Payer: Medicare Other | Source: Ambulatory Visit | Attending: Surgery | Admitting: Surgery

## 2011-08-24 ENCOUNTER — Encounter (HOSPITAL_COMMUNITY): Payer: Self-pay

## 2011-08-24 ENCOUNTER — Encounter (HOSPITAL_COMMUNITY): Payer: Self-pay | Admitting: Pharmacy Technician

## 2011-08-24 ENCOUNTER — Encounter (HOSPITAL_COMMUNITY)
Admission: RE | Admit: 2011-08-24 | Discharge: 2011-08-24 | Disposition: A | Discharge status: Home / Self Care | Payer: Medicare Other | Source: Ambulatory Visit | Attending: Surgery | Admitting: Surgery

## 2011-08-24 DIAGNOSIS — Z01818 Encounter for other preprocedural examination: Secondary | ICD-10-CM | POA: Insufficient documentation

## 2011-08-24 DIAGNOSIS — Z01812 Encounter for preprocedural laboratory examination: Secondary | ICD-10-CM | POA: Insufficient documentation

## 2011-08-24 DIAGNOSIS — K429 Umbilical hernia without obstruction or gangrene: Secondary | ICD-10-CM | POA: Insufficient documentation

## 2011-08-24 HISTORY — DX: Congenital malformation of eye, unspecified: Q15.9

## 2011-08-24 LAB — CBC
Hemoglobin: 15.4 g/dL (ref 13.0–17.0)
MCHC: 35.3 g/dL (ref 30.0–36.0)
Platelets: 186 10*3/uL (ref 150–400)
RBC: 4.97 MIL/uL (ref 4.22–5.81)

## 2011-08-24 LAB — SURGICAL PCR SCREEN
MRSA, PCR: NEGATIVE
Staphylococcus aureus: NEGATIVE

## 2011-08-24 LAB — BASIC METABOLIC PANEL
GFR calc non Af Amer: 67 mL/min — ABNORMAL LOW (ref >90)
Glucose, Bld: 87 mg/dL (ref 70–99)
Potassium: 4.6 mEq/L (ref 3.5–5.1)
Sodium: 138 mEq/L (ref 135–145)

## 2011-08-24 NOTE — Pre-Procedure Instructions (Signed)
Last cardiology office visit /clerance note dr Myrtis Ser 08-16-2011 epic Carotid duplex 01-15-2012 epic ekg 08-16-2011 epic Reviewed pre op instruction using teachback method

## 2011-08-24 NOTE — Patient Instructions (Addendum)
[address]  08/24/2011   Your procedure is scheduled on: 09-03-2011   Report to [facility] at  0815 AM.  Call this number if you have problems the morning of surgery: [phone]   Remember: wife janet driver for surgery cell [phone]   Do not eat food or drink liquids:After Midnight.  .  Take these medicines the morning of surgery with A SIP OF WATER: metorpolol succcinate, atorvastatin   Do not wear jewelry or make up.  Do not wear lotions, powders, or perfumes.Do not wear deodorant.    Do not bring valuables to the hospital.  Contacts, dentures or bridgework may not be worn into surgery.  Leave suitcase in the car. After surgery it may be brought to your room.  For patients admitted to the hospital, checkout time is 11:00 AM the day of   discharge.     Special Instructions: CHG Shower Use Special Wash: 1/2 bottle night before surgery and 1/2 bottle morning of surgery, use regular soap on face and front and back private area.   Please read over the following fact sheets that you were given: MRSA Information  [facility] pre op nurse phone number [phone], call if needed

## 2011-09-02 NOTE — H&P (Signed)
HPI Jorge [name] is a 66 y.o. male.  Referred by Dr. Renne Crigler for evaluation of umbilical and left inguinal hernias HPI 66 yo male who is quite active presents with a four year of a protruding umbilicus.  He has had one episode of painful incarceration, but he was able to reduce this himself.  About 8 months ago, he felt a pain in his left groin.  This pain has persisted and he was told that he had a hernia.  He denies any swelling in this area.  He denies any obstructive symptoms.    Past Medical History   Diagnosis  Date   .  Dyslipidemia     .  CAD (coronary artery disease)         PCI d iagonal 2001, residual 60% LAD /  Nuclear 2006, no ischemia   .  Ejection fraction         EF normal,   .  PVC's (premature ventricular contractions)         August, 2011   .  Carotid bruit         Left carotid bruit, September, 2012   .  Psoriatic arthritis         2012   .  Arthritis     .  Umbilical hernia  07/31/2011         Past Surgical History   Procedure  Date   .  Carpel tunnel  2009/2010   .  Angioplasty  12/1999 and 03/2000         Family History   Problem  Relation  Age of Onset   .  Cancer  Mother         uterine   .  Cancer  Sister         Breast        Social History History   Substance Use Topics   .  Smoking status:  Former Smoker       Quit date:  12/08/1994   .  Smokeless tobacco:  Not on file   .  Alcohol Use:  Yes         1 drink per day         Allergies   Allergen  Reactions   .  Niacin         REACTION: Reaction not known         Current Outpatient Prescriptions   Medication  Sig  Dispense  Refill   .  aspirin 81 MG tablet  Take 81 mg by mouth daily.           Marland Kitchen  atorvastatin (LIPITOR) 40 MG tablet  Take 40 mg by mouth daily.           .  clopidogrel (PLAVIX) 75 MG tablet  TAKE 1 TABLET BY MOUTH EVERY DAY   30 tablet   9   .  Ezetimibe (ZETIA PO)  Take 1 tablet by mouth daily.           .  fish oil-omega-3 fatty acids 1000 MG capsule   Take 2 g by mouth daily.           Marland Kitchen  ibuprofen (ADVIL,MOTRIN) 200 MG tablet  Take 200 mg by mouth every 6 (six) hours as needed.         .  metoprolol succinate (TOPROL-XL) 25 MG 24 hr tablet  TAKE  1/2 TABLET BY MOUTH DAILY   90 tablet  1   .  Multiple Vitamin (MULTIVITAMIN) capsule  Take 1 capsule by mouth daily.           .  naproxen sodium (ANAPROX) 220 MG tablet  Take 220 mg by mouth 2 (two) times daily with a meal.              Review of Systems Review of Systems  Constitutional: Negative for fever, chills and unexpected weight change.  HENT: Negative for hearing loss, congestion, sore throat, trouble swallowing and voice change.   Eyes: Negative for visual disturbance.  Respiratory: Positive for cough. Negative for wheezing.   Cardiovascular: Negative for chest pain, palpitations and leg swelling.  Gastrointestinal: Negative for nausea, vomiting, abdominal pain, diarrhea, constipation, blood in stool, abdominal distention, anal bleeding and rectal pain.  Genitourinary: Negative for hematuria and difficulty urinating.  Musculoskeletal: Positive for arthralgias.  Skin: Negative for rash and wound.  Neurological: Positive for headaches. Negative for seizures, syncope and weakness.  Hematological: Negative for adenopathy. Does not bruise/bleed easily.  Psychiatric/Behavioral: Negative for confusion.      Blood pressure 118/64, pulse 60, temperature 97.2 F (36.2 C), temperature source Temporal, resp. rate 14, height 5\' 9"  (1.753 m), weight 215 lb 3.2 oz (97.614 kg).   Physical Exam Physical Exam WDWN in NAD HEENT:  EOMI, sclera anicteric Neck:  No masses, no thyromegaly Lungs:  CTA bilaterally; normal respiratory effort CV:  Regular rate and rhythm; no murmurs Abd:  +bowel sounds, soft, non-tender, 3 cm palpable, partially reducible umbilical hernia GU:  Bilateral descended testes.  No testicular masses.  No sign of RIH.  Easily palpable, reducible LIH Ext:   Well-perfused; no edema Skin:  Warm, dry; no sign of jaundice   Data Reviewed None   Assessment    1.  Left inguinal hernia 2.  Umbilical hernia       Plan    Left inguinal hernia repair with mesh and umbilical hernia repair with mesh.  The surgical procedures  has been discussed with the patient.  Potential risks, benefits, alternative treatments, and expected outcomes have been explained.  All of the patient's questions at this time have been answered.  The likelihood of reaching the patient's treatment goal is good.  The patient understand the proposed surgical procedure and wishes to proceed.   We will first obtain cardiac clearance from Dr. Myrtis Ser.       [name], MD, [facility]  09/02/2011 10:12 PM

## 2011-09-03 ENCOUNTER — Ambulatory Visit (HOSPITAL_COMMUNITY)
Admission: RE | Admit: 2011-09-03 | Discharge: 2011-09-03 | Disposition: A | Discharge status: Home / Self Care | Payer: Medicare Other | Source: Ambulatory Visit | Attending: Surgery | Admitting: Surgery

## 2011-09-03 ENCOUNTER — Encounter (HOSPITAL_COMMUNITY): Payer: Self-pay | Admitting: *Deleted

## 2011-09-03 ENCOUNTER — Encounter (HOSPITAL_COMMUNITY): Payer: Self-pay | Admitting: Anesthesiology

## 2011-09-03 ENCOUNTER — Encounter (HOSPITAL_COMMUNITY)
Admission: RE | Disposition: A | Discharge status: Home / Self Care | Payer: Self-pay | Source: Ambulatory Visit | Attending: Surgery

## 2011-09-03 ENCOUNTER — Ambulatory Visit (HOSPITAL_COMMUNITY): Payer: Medicare Other | Admitting: Anesthesiology

## 2011-09-03 DIAGNOSIS — K4090 Unilateral inguinal hernia, without obstruction or gangrene, not specified as recurrent: Secondary | ICD-10-CM | POA: Insufficient documentation

## 2011-09-03 DIAGNOSIS — Z79899 Other long term (current) drug therapy: Secondary | ICD-10-CM | POA: Insufficient documentation

## 2011-09-03 DIAGNOSIS — K429 Umbilical hernia without obstruction or gangrene: Secondary | ICD-10-CM

## 2011-09-03 DIAGNOSIS — Z7982 Long term (current) use of aspirin: Secondary | ICD-10-CM | POA: Insufficient documentation

## 2011-09-03 DIAGNOSIS — I2510 Atherosclerotic heart disease of native coronary artery without angina pectoris: Secondary | ICD-10-CM | POA: Insufficient documentation

## 2011-09-03 DIAGNOSIS — E785 Hyperlipidemia, unspecified: Secondary | ICD-10-CM | POA: Insufficient documentation

## 2011-09-03 HISTORY — PX: INGUINAL HERNIA REPAIR: SHX194

## 2011-09-03 HISTORY — PX: UMBILICAL HERNIA REPAIR: SHX196

## 2011-09-03 SURGERY — REPAIR, HERNIA, INGUINAL, ADULT
Anesthesia: General | Wound class: Clean

## 2011-09-03 MED ORDER — LACTATED RINGERS IV SOLN
INTRAVENOUS | Status: DC | PRN
  Administered 2011-09-03 (×2): via INTRAVENOUS

## 2011-09-03 MED ORDER — BUPIVACAINE-EPINEPHRINE PF 0.25-1:200000 % IJ SOLN
INTRAMUSCULAR | Status: AC
  Filled 2011-09-03: qty 30

## 2011-09-03 MED ORDER — CEFAZOLIN SODIUM-DEXTROSE 2-3 GM-% IV SOLR
INTRAVENOUS | Status: AC
  Filled 2011-09-03: qty 50

## 2011-09-03 MED ORDER — OXYCODONE-ACETAMINOPHEN 5-325 MG PO TABS
ORAL_TABLET | ORAL | Status: AC
  Filled 2011-09-03: qty 1

## 2011-09-03 MED ORDER — PROPOFOL 10 MG/ML IV BOLUS
INTRAVENOUS | Status: DC | PRN
  Administered 2011-09-03: 200 mg via INTRAVENOUS

## 2011-09-03 MED ORDER — HYDROMORPHONE HCL PF 1 MG/ML IJ SOLN: 0.2500 mg | INTRAMUSCULAR | Status: DC | PRN

## 2011-09-03 MED ORDER — EPHEDRINE SULFATE 50 MG/ML IJ SOLN
INTRAMUSCULAR | Status: DC | PRN
  Administered 2011-09-03: 5 mg via INTRAVENOUS
  Administered 2011-09-03: 10 mg via INTRAVENOUS

## 2011-09-03 MED ORDER — FENTANYL CITRATE 0.05 MG/ML IJ SOLN
INTRAMUSCULAR | Status: DC | PRN
  Administered 2011-09-03 (×2): 50 ug via INTRAVENOUS
  Administered 2011-09-03: 25 ug via INTRAVENOUS
  Administered 2011-09-03: 100 ug via INTRAVENOUS
  Administered 2011-09-03: 25 ug via INTRAVENOUS

## 2011-09-03 MED ORDER — LIDOCAINE HCL 1 % IJ SOLN
INTRAMUSCULAR | Status: DC | PRN
  Administered 2011-09-03: 60 mg via INTRADERMAL

## 2011-09-03 MED ORDER — MORPHINE SULFATE 10 MG/ML IJ SOLN: 2.0000 mg | INTRAMUSCULAR | Status: DC | PRN

## 2011-09-03 MED ORDER — 0.9 % SODIUM CHLORIDE (POUR BTL) OPTIME
TOPICAL | Status: DC | PRN
  Administered 2011-09-03: 1000 mL

## 2011-09-03 MED ORDER — BUPIVACAINE-EPINEPHRINE 0.25% -1:200000 IJ SOLN
INTRAMUSCULAR | Status: DC | PRN
  Administered 2011-09-03: 20 mL

## 2011-09-03 MED ORDER — OXYCODONE-ACETAMINOPHEN 5-325 MG PO TABS: 1.0000 | ORAL_TABLET | ORAL | Status: AC | PRN

## 2011-09-03 MED ORDER — PROMETHAZINE HCL 25 MG/ML IJ SOLN: 6.2500 mg | INTRAMUSCULAR | Status: DC | PRN

## 2011-09-03 MED ORDER — ACETAMINOPHEN 10 MG/ML IV SOLN
INTRAVENOUS | Status: AC
  Filled 2011-09-03: qty 100

## 2011-09-03 MED ORDER — KETOROLAC TROMETHAMINE 30 MG/ML IJ SOLN: 15.0000 mg | Freq: Once | INTRAMUSCULAR | Status: DC | PRN

## 2011-09-03 MED ORDER — OXYCODONE-ACETAMINOPHEN 5-325 MG PO TABS
1.0000 | ORAL_TABLET | ORAL | Status: DC | PRN
  Administered 2011-09-03: 1 via ORAL

## 2011-09-03 MED ORDER — CEFAZOLIN SODIUM-DEXTROSE 2-3 GM-% IV SOLR
2.0000 g | INTRAVENOUS | Status: AC
  Administered 2011-09-03: 2 g via INTRAVENOUS

## 2011-09-03 MED ORDER — ONDANSETRON HCL 4 MG/2ML IJ SOLN: 4.0000 mg | INTRAMUSCULAR | Status: DC | PRN

## 2011-09-03 MED ORDER — ONDANSETRON HCL 4 MG/2ML IJ SOLN
INTRAMUSCULAR | Status: DC | PRN
  Administered 2011-09-03: 4 mg via INTRAVENOUS

## 2011-09-03 SURGICAL SUPPLY — 57 items
BENZOIN TINCTURE PRP APPL 2/3 (GAUZE/BANDAGES/DRESSINGS) ×3 IMPLANT
BLADE HEX COATED 2.75 (ELECTRODE) ×3 IMPLANT
BLADE SURG 15 STRL LF DISP TIS (BLADE) ×2 IMPLANT
BLADE SURG 15 STRL SS (BLADE) ×1
CHLORAPREP W/TINT 26ML (MISCELLANEOUS) ×3 IMPLANT
CLOTH BEACON ORANGE TIMEOUT ST (SAFETY) ×3 IMPLANT
COVER SURGICAL LIGHT HANDLE (MISCELLANEOUS) IMPLANT
DECANTER SPIKE VIAL GLASS SM (MISCELLANEOUS) IMPLANT
DISSECTOR ROUND CHERRY 3/8 STR (MISCELLANEOUS) IMPLANT
DRAIN PENROSE 18X1/2 LTX STRL (DRAIN) ×3 IMPLANT
DRAPE LAPAROSCOPIC ABDOMINAL (DRAPES) ×3 IMPLANT
DRAPE LAPAROTOMY T 102X78X121 (DRAPES) IMPLANT
DRAPE LAPAROTOMY TRNSV 102X78 (DRAPE) IMPLANT
DRAPE UTILITY XL STRL (DRAPES) ×3 IMPLANT
DRSG TEGADERM 4X4.75 (GAUZE/BANDAGES/DRESSINGS) ×6 IMPLANT
ELECT REM PT RETURN 9FT ADLT (ELECTROSURGICAL) ×3
ELECTRODE REM PT RTRN 9FT ADLT (ELECTROSURGICAL) ×2 IMPLANT
GAUZE SPONGE 4X4 16PLY XRAY LF (GAUZE/BANDAGES/DRESSINGS) ×3 IMPLANT
GLOVE BIO SURGEON STRL SZ7 (GLOVE) ×3 IMPLANT
GLOVE BIOGEL PI IND STRL 7.0 (GLOVE) IMPLANT
GLOVE BIOGEL PI IND STRL 7.5 (GLOVE) ×2 IMPLANT
GLOVE BIOGEL PI INDICATOR 7.0 (GLOVE)
GLOVE BIOGEL PI INDICATOR 7.5 (GLOVE) ×1
GOWN STRL NON-REIN LRG LVL3 (GOWN DISPOSABLE) ×6 IMPLANT
GOWN STRL REIN XL XLG (GOWN DISPOSABLE) IMPLANT
KIT BASIN OR (CUSTOM PROCEDURE TRAY) ×3 IMPLANT
MESH ULTRAPRO 3X6 7.6X15CM (Mesh General) ×3 IMPLANT
NEEDLE HYPO 22GX1.5 SAFETY (NEEDLE) ×3 IMPLANT
NEEDLE HYPO 25X1 1.5 SAFETY (NEEDLE) ×3 IMPLANT
NS IRRIG 1000ML POUR BTL (IV SOLUTION) ×3 IMPLANT
PACK BASIC VI WITH GOWN DISP (CUSTOM PROCEDURE TRAY) ×3 IMPLANT
PACK GENERAL/GYN (CUSTOM PROCEDURE TRAY) IMPLANT
PENCIL BUTTON HOLSTER BLD 10FT (ELECTRODE) ×3 IMPLANT
SPONGE GAUZE 4X4 12PLY (GAUZE/BANDAGES/DRESSINGS) ×3 IMPLANT
SPONGE LAP 4X18 X RAY DECT (DISPOSABLE) ×6 IMPLANT
STRIP CLOSURE SKIN 1/2X4 (GAUZE/BANDAGES/DRESSINGS) ×3 IMPLANT
SUT MNCRL AB 4-0 PS2 18 (SUTURE) ×6 IMPLANT
SUT NOVA NAB DX-16 0-1 5-0 T12 (SUTURE) ×3 IMPLANT
SUT NOVA NAB GS-21 0 18 T12 DT (SUTURE) ×3 IMPLANT
SUT PDS AB 0 CT1 36 (SUTURE) ×3 IMPLANT
SUT PROLENE 0 CT 1 CR/8 (SUTURE) IMPLANT
SUT PROLENE 0 CT 2 (SUTURE) IMPLANT
SUT PROLENE 2 0 SH DA (SUTURE) ×3 IMPLANT
SUT SILK 2 0 SH (SUTURE) IMPLANT
SUT SILK 3 0 (SUTURE)
SUT SILK 3-0 18XBRD TIE 12 (SUTURE) IMPLANT
SUT VIC AB 2-0 CT2 27 (SUTURE) IMPLANT
SUT VIC AB 2-0 SH 27 (SUTURE) ×1
SUT VIC AB 2-0 SH 27X BRD (SUTURE) ×2 IMPLANT
SUT VIC AB 3-0 SH 27 (SUTURE) ×1
SUT VIC AB 3-0 SH 27X BRD (SUTURE) IMPLANT
SUT VIC AB 3-0 SH 27XBRD (SUTURE) ×2 IMPLANT
SUT VICRYL 0 27 CT2 27 ABS (SUTURE) IMPLANT
SUT VICRYL 0 UR6 27IN ABS (SUTURE) IMPLANT
SYR CONTROL 10ML LL (SYRINGE) ×3 IMPLANT
TAPE CLOTH SURG 4X10 WHT LF (GAUZE/BANDAGES/DRESSINGS) ×3 IMPLANT
TOWEL OR 17X26 10 PK STRL BLUE (TOWEL DISPOSABLE) ×6 IMPLANT

## 2011-09-03 NOTE — Discharge Instructions (Signed)
[facility]  UMBILICAL OR INGUINAL HERNIA REPAIR: POST OP INSTRUCTIONS  Always review your discharge instruction sheet given to you by the facility where your surgery was performed. IF YOU HAVE DISABILITY OR FAMILY LEAVE FORMS, YOU MUST BRING THEM TO THE OFFICE FOR PROCESSING.   DO NOT GIVE THEM TO YOUR DOCTOR.  1. A  prescription for pain medication may be given to you upon discharge.  Take your pain medication as prescribed, if needed.  If narcotic pain medicine is not needed, then you may take acetaminophen (Tylenol) or ibuprofen (Advil) as needed. 2. Take your usually prescribed medications unless otherwise directed. 3. If you need a refill on your pain medication, please contact your pharmacy.  They will contact our office to request authorization. Prescriptions will not be filled after 5 pm or on week-ends. 4. You should follow a light diet the first 24 hours after arrival home, such as soup and crackers, etc.  Be sure to include lots of fluids daily.  Resume your normal diet the day after surgery. 5. Most patients will experience some swelling and bruising around the umbilicus or in the groin and scrotum.  Ice packs and reclining will help.  Swelling and bruising can take several days to resolve.  6. It is common to experience some constipation if taking pain medication after surgery.  Increasing fluid intake and taking a stool softener (such as Colace) will usually help or prevent this problem from occurring.  A mild laxative (Milk of Magnesia or Miralax) should be taken according to package directions if there are no bowel movements after 48 hours. 7. Unless discharge instructions indicate otherwise, you may remove your bandages 24-48 hours after surgery, and you may shower at that time.  You will have steri-strips (small skin tapes) in place directly over the incision.  These strips should be left on the skin for 7-10 days. 8. ACTIVITIES:  You may resume regular (light)  daily activities beginning the next day--such as daily self-care, walking, climbing stairs--gradually increasing activities as tolerated.  You may have sexual intercourse when it is comfortable.  Refrain from any heavy lifting or straining until approved by your doctor. a. You may drive when you are no longer taking prescription pain medication, you can comfortably wear a seatbelt, and you can safely maneuver your car and apply brakes. b. RETURN TO WORK:  2-3 weeks with light duty - no lifting over 15 lbs. 9. You should see your doctor in the office for a follow-up appointment approximately 2-3 weeks after your surgery.  Make sure that you call for this appointment within a day or two after you arrive home to insure a convenient appointment time. 10. OTHER INSTRUCTIONS:  __________________________________________________________________________________________________________________________________________________________________________________________  WHEN TO CALL YOUR DOCTOR: 1. Fever over 101.0 2. Inability to urinate 3. Nausea and/or vomiting 4. Extreme swelling or bruising 5. Continued bleeding from incision. 6. Increased pain, redness, or drainage from the incision  The clinic staff is available to answer your questions during regular business hours.  Please don't hesitate to call and ask to speak to one of the nurses for clinical concerns.  If you have a medical emergency, go to the nearest emergency room or call 911.  A surgeon from [facility] is always on call at the hospital   [address], [address], [place], [place]  [address] ?  [address] [address], [place], [place]   [address] [phone]    [phone]    FAX [phone] Web   site: [email or web address]    

## 2011-09-03 NOTE — Transfer of Care (Signed)
Immediate Anesthesia Transfer of Care Note  Patient: Jorge [name] [facility]  Procedure(s) Performed: Procedure(s) (LRB): HERNIA REPAIR INGUINAL ADULT (Left) HERNIA REPAIR UMBILICAL ADULT (N/A) INSERTION OF MESH (N/A)  Patient Location: PACU  Anesthesia Type: General  Level of Consciousness: alert , oriented and patient cooperative  Airway & Oxygen Therapy: Patient Spontanous Breathing and Patient connected to face mask oxygen  Post-op Assessment: Report given to PACU RN and Post -op Vital signs reviewed and stable  Post vital signs: Reviewed and stable  Complications: No apparent anesthesia complications

## 2011-09-03 NOTE — Transfer of Care (Signed)
Immediate Anesthesia Transfer of Care Note  Patient: Jorge [name] [facility]  Procedure(s) Performed: Procedure(s) (LRB): HERNIA REPAIR INGUINAL ADULT (Left) HERNIA REPAIR UMBILICAL ADULT (N/A) INSERTION OF MESH (N/A)  Patient Location: PACU  Anesthesia Type: General  Level of Consciousness: awake, alert  and patient cooperative  Airway & Oxygen Therapy: Patient Spontanous Breathing and Patient connected to face mask oxygen  Post-op Assessment: Report given to PACU RN, Post -op Vital signs reviewed and stable and Patient moving all extremities  Post vital signs: Reviewed and stable  Complications: No apparent anesthesia complications

## 2011-09-03 NOTE — Anesthesia Preprocedure Evaluation (Addendum)
Anesthesia Evaluation  Patient identified by MRN, date of birth, ID band Patient awake    Reviewed: Allergy & Precautions, H&P , NPO status , Patient's Chart, lab work & pertinent test results  Airway Mallampati: II TM Distance: <3 FB Neck ROM: Full    Dental No notable dental hx.    Pulmonary neg pulmonary ROS,  breath sounds clear to auscultation  Pulmonary exam normal       Cardiovascular + CAD Rhythm:Regular Rate:Normal  NL EF, very actice   Neuro/Psych negative neurological ROS  negative psych ROS   GI/Hepatic negative GI ROS, Neg liver ROS,   Endo/Other  negative endocrine ROS  Renal/GU negative Renal ROS  negative genitourinary   Musculoskeletal negative musculoskeletal ROS (+)   Abdominal   Peds negative pediatric ROS (+)  Hematology negative hematology ROS (+)   Anesthesia Other Findings   Reproductive/Obstetrics negative OB ROS                          Anesthesia Physical Anesthesia Plan  ASA: II  Anesthesia Plan: General   Post-op Pain Management:    Induction: Intravenous  Airway Management Planned: Oral ETT  Additional Equipment:   Intra-op Plan:   Post-operative Plan: Extubation in OR  Informed Consent: I have reviewed the patients History and Physical, chart, labs and discussed the procedure including the risks, benefits and alternatives for the proposed anesthesia with the patient or authorized representative who has indicated his/her understanding and acceptance.   Dental advisory given  Plan Discussed with: CRNA  Anesthesia Plan Comments:         Anesthesia Quick Evaluation

## 2011-09-03 NOTE — Op Note (Signed)
Hernia, Open, Procedure Note  Indications: The patient presented with a history of a left, reducible inguinal hernia and umbilical hernia  Pre-operative Diagnosis: left reducible inguinal hernia and non-reducible umbilical hernia Post-operative Diagnosis: same  Surgeon: Wynona Luna.   Assistants: none  Anesthesia: General LMA anesthesia  ASA Class: 2  Procedure Details  The patient was seen again in the Holding Room. The risks, benefits, complications, treatment options, and expected outcomes were discussed with the patient. The possibilities of reaction to medication, pulmonary aspiration, perforation of viscus, bleeding, recurrent infection, the need for additional procedures, and development of a complication requiring transfusion or further operation were discussed with the patient and/or family. The likelihood of success in repairing the hernias and returning the patient to their previous functional status is good.  There was concurrence with the proposed plan, and informed consent was obtained. The site of surgery was properly noted/marked. The patient was taken to the Operating Room, identified as Jorge [name], and the procedure verified as left inguinal hernia repair and umbilical hernia repair. A Time Out was held and the above information confirmed.  The patient was placed in the supine position and underwent induction of anesthesia. The lower abdomen and groin was prepped with Chloraprep and draped in the standard fashion, and 0.5% Marcaine with epinephrine was used to anesthetize the skin over the mid-portion of the inguinal canal. An oblique incision was made. Dissection was carried down through the subcutaneous tissue with cautery to the external oblique fascia.  We opened the external oblique fascia along the direction of its fibers to the external ring.  The spermatic cord was circumferentially dissected bluntly and retracted with a Penrose drain.  The floor of the inguinal  canal was inspected and revealed a large direct defect with a protruding hernia.  We reduced the hernia completely and closed the floor of the inguinal canal with 0 PDS.  We skeletonized the spermatic cord and reduced a small cord lipoma.  We used a 3 x 6 inch piece of Ultrapro mesh, which was cut into a keyhole shape.  This was secured with 2-0 Prolene, beginning at the pubic tubercle, running this along the internal oblique fascia superiorly and the shelving edge inferiorly.  The tails of the mesh were sutured together behind the spermatic cord.  The mesh was tucked underneath the external oblique fascia laterally.  The external oblique fascia was reapproximated with 2-0 Vicryl.  3-0 Vicryl was used to close the subcutaneous tissues and 4-0 Monocryl was used to close the skin in subcuticular fashion.  Benzoin and steri-strips were used to seal the incision.  A clean dressing was applied.  The patient was then extubated and brought to the recovery room in stable condition.  All sponge, instrument, and needle counts were correct prior to closure and at the conclusion of the case.    We made a transverse incision above the umbilicus.  Dissection was carried down to the hernia sac with cautery.  We dissected bluntly around the hernia sac down to the edge of the fascial defect.  We reduced the hernia sac back into the pre-peritoneal space.  The fascial defect measured 5 mm.  We cleared the fascia in all directions. The fascial defect was closed with multiple interrupted figure-of-eight 1 Novofil sutures.  The base of the umbilicus was tacked down with 3-0 Vicryl.  3-0 Vicryl was used to close the subcutaneous tissues and 4-0 Monocryl was used to close the skin.  Steri-strips and clean dressing were  applied.  The patient was extubated and brought to the recovery room in stable condition.  All sponge, instrument, and needle counts were correct prior to closure and at the conclusion of the case.     Estimated Blood  Loss: Minimal                 Complications: None; patient tolerated the procedure well.         Disposition: PACU - hemodynamically stable.         Condition: stable  [name], MD, [facility]  09/03/2011 1:14 PM

## 2011-09-03 NOTE — Anesthesia Postprocedure Evaluation (Signed)
  Anesthesia Post-op Note  Patient: Jorge [name]  Procedure(s) Performed: Procedure(s) (LRB): HERNIA REPAIR INGUINAL ADULT (Left) HERNIA REPAIR UMBILICAL ADULT (N/A) INSERTION OF MESH (N/A)  Patient Location: PACU  Anesthesia Type: General  Level of Consciousness: awake and alert   Airway and Oxygen Therapy: Patient Spontanous Breathing  Post-op Pain: mild  Post-op Assessment: Post-op Vital signs reviewed, Patient's Cardiovascular Status Stable, Respiratory Function Stable, Patent Airway and No signs of Nausea or vomiting  Post-op Vital Signs: stable  Complications: No apparent anesthesia complications

## 2011-09-04 ENCOUNTER — Encounter (HOSPITAL_COMMUNITY): Payer: Self-pay | Admitting: Surgery

## 2011-10-02 ENCOUNTER — Encounter (INDEPENDENT_AMBULATORY_CARE_PROVIDER_SITE_OTHER): Payer: Medicare Other | Admitting: Surgery

## 2011-10-04 ENCOUNTER — Ambulatory Visit (INDEPENDENT_AMBULATORY_CARE_PROVIDER_SITE_OTHER): Payer: Medicare Other | Admitting: Surgery

## 2011-10-04 ENCOUNTER — Encounter (INDEPENDENT_AMBULATORY_CARE_PROVIDER_SITE_OTHER): Payer: Self-pay | Admitting: Surgery

## 2011-10-04 VITALS — BP 132/72 | HR 56 | Temp 97.8°F | Resp 16 | Ht 70.0 in | Wt 211.0 lb

## 2011-10-04 DIAGNOSIS — K4090 Unilateral inguinal hernia, without obstruction or gangrene, not specified as recurrent: Secondary | ICD-10-CM

## 2011-10-04 DIAGNOSIS — K429 Umbilical hernia without obstruction or gangrene: Secondary | ICD-10-CM

## 2011-10-04 NOTE — Progress Notes (Signed)
Status post open repair of left inguinal hernia for large direct hernia as well as suture repair of a small umbilical hernia on 09/03/11. The patient is doing quite well. Minimal swelling. No pain.  Both incisions are well healed with no sign of infection. No sign of recurrent hernia at his umbilicus or in his left groin. The patient may begin slowly increasing his level of activity over the next couple of weeks and resume full activity at the end of the month. Followup p.r.n.  Jorge [name]. Corliss Skains, MD, [facility]  10/04/2011 10:36 AM

## 2012-01-29 ENCOUNTER — Encounter: Payer: Self-pay | Admitting: Cardiology

## 2012-04-09 ENCOUNTER — Other Ambulatory Visit: Payer: Self-pay

## 2012-04-09 MED ORDER — METOPROLOL SUCCINATE ER 25 MG PO TB24: 12.5000 mg | ORAL_TABLET | Freq: Every day | ORAL | Status: DC

## 2012-04-23 ENCOUNTER — Encounter: Payer: Self-pay | Admitting: Cardiology

## 2012-06-11 ENCOUNTER — Other Ambulatory Visit: Payer: Self-pay | Admitting: Cardiology

## 2012-06-11 MED ORDER — CLOPIDOGREL BISULFATE 75 MG PO TABS: 75.0000 mg | ORAL_TABLET | Freq: Every day | ORAL | Status: DC

## 2012-08-01 ENCOUNTER — Encounter: Payer: Self-pay | Admitting: Cardiology

## 2012-08-01 ENCOUNTER — Ambulatory Visit (INDEPENDENT_AMBULATORY_CARE_PROVIDER_SITE_OTHER): Payer: Medicare Other | Admitting: Cardiology

## 2012-08-01 VITALS — BP 121/69 | HR 96 | Ht 70.0 in | Wt 217.0 lb

## 2012-08-01 DIAGNOSIS — I4949 Other premature depolarization: Secondary | ICD-10-CM

## 2012-08-01 DIAGNOSIS — I779 Disorder of arteries and arterioles, unspecified: Secondary | ICD-10-CM

## 2012-08-01 DIAGNOSIS — I2510 Atherosclerotic heart disease of native coronary artery without angina pectoris: Secondary | ICD-10-CM

## 2012-08-01 DIAGNOSIS — I493 Ventricular premature depolarization: Secondary | ICD-10-CM

## 2012-08-01 DIAGNOSIS — E785 Hyperlipidemia, unspecified: Secondary | ICD-10-CM

## 2012-08-01 NOTE — Assessment & Plan Note (Signed)
Patient continues on appropriate statin therapy. No change in therapy.

## 2012-08-01 NOTE — Assessment & Plan Note (Signed)
Patient chronically has many resting PVCs. We know that they decrease with exercise. He's had no evidence of PVC related cardiomyopathy. He will have a stress echo. The resting data will show me what his resting LV function is. We will also see what his PVCs do again with stress.  As part of today's evaluation I spent greater than 25 minutes with the patient's overall complete care. More than half of this has been spent talking with him about many of these issues and explaining the rationale for the studies.

## 2012-08-01 NOTE — Assessment & Plan Note (Signed)
Patient has known carotid disease. It was minimal in October 2 012. We will plan for followup in one more year.

## 2012-08-01 NOTE — Patient Instructions (Addendum)
**Note De-Identified  Obfuscation** Your physician has requested that you have a stress echocardiogram. For further information please visit [email or web address] Please follow instruction sheet as given.  Your physician recommends that you continue on your current medications as directed. Please refer to the Current Medication list given to you today.  Your physician wants you to follow-up in: 1 year. You will receive a reminder letter in the mail two months in advance. If you don't receive a letter, please call our office to schedule the follow-up appointment.

## 2012-08-01 NOTE — Progress Notes (Signed)
HPI  Patient is seen today to followup his coronary disease and PVCs. I saw him last May, 2013. He was cleared for hernia operation at that time. He had the surgery and did well. 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 He is able to walk. He does not exercise a great deal. He's not having any chest pain. 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 We have shown in the past PVCs decreased and go away when he exercises.  An EKG had been done by his primary team in January, 2014. That tracing shows PVCs.  Allergies  Allergen Reactions  . Niacin Itching    And swelling    Current Outpatient Prescriptions  Medication Sig Dispense Refill  . aspirin 81 MG tablet Take 81 mg by mouth daily with breakfast.       . atorvastatin (LIPITOR) 40 MG tablet Take 40 mg by mouth daily with breakfast.       . clopidogrel (PLAVIX) 75 MG tablet Take 1 tablet (75 mg total) by mouth daily with breakfast.  30 tablet  4  . ezetimibe (ZETIA) 10 MG tablet Take 10 mg by mouth daily with breakfast.      . fish oil-omega-3 fatty acids 1000 MG capsule Take 2 g by mouth daily with breakfast.       . ibuprofen (ADVIL,MOTRIN) 200 MG tablet Take 400 mg by mouth every 6 (six) hours as needed. For pain      . metoprolol succinate (TOPROL-XL) 25 MG 24 hr tablet Take 0.5 tablets (12.5 mg total) by mouth daily with breakfast.  15 tablet  5  . Multiple Vitamin (MULTIVITAMIN) capsule Take 1 capsule by mouth daily.        . naproxen sodium (ANAPROX) 220 MG tablet Take 440 mg by mouth 2 (two) times daily as needed.       No current facility-administered medications for this visit.    History   Social History  . Marital Status: Married    Spouse Name: N/A    Number of Children: N/A  . Years of Education: N/A   Occupational History  . Not on file.   Social History Main Topics  . Smoking status: Former Smoker -- 1.50 packs/day for 25 years    Types: Cigarettes    Quit date: 12/08/1994  . Smokeless tobacco: Former Neurosurgeon  . Alcohol Use:  Yes     Comment: 1 drink per day  . Drug Use: No  . Sexually Active: Not on file   Other Topics Concern  . Not on file   Social History Narrative  . No narrative on file    Family History  Problem Relation Age of Onset  . Cancer Mother     uterine  . Cancer Sister     Breast    Past Medical History  Diagnosis Date  . Dyslipidemia   . CAD (coronary artery disease)     PCI d iagonal 2001, residual 60% LAD /  Nuclear 2006, no ischemia  . Ejection fraction     EF normal,  . Carotid artery disease     Doppler, October, 2012, 0-39% bilateral stenoses.  . Psoriatic arthritis     2012  . Arthritis   . Umbilical hernia 07/31/2011  . Preop cardiovascular exam     Patient needs cardiac clearance for hernia surgery May, 2013  . PVC's (premature ventricular contractions)     Frequent PVCs noted over the years., These always decrease with exercise. This is documented  .  Eye abnormalities 08-23-2011    right ryr injection for bleed done by dr Luciana Axe, dr Luciana Axe told pt ok to have surgery 09-03-2011    Past Surgical History  Procedure Laterality Date  . Carpel tunnel  2009/2010    both wrists  . Angioplasty  12/1999 and 03/2000  . Right knee meniscus repair  1990's  . Inguinal hernia repair  09/03/2011    Procedure: HERNIA REPAIR INGUINAL ADULT;  Surgeon: [name], MD;  Location: [facility];  Service: General;  Laterality: Left;  Left Ingunal Hernia Repair with Mesh  . Umbilical hernia repair  09/03/2011    Procedure: HERNIA REPAIR UMBILICAL ADULT;  Surgeon: [name], MD;  Location: [facility];  Service: General;  Laterality: N/A;    Patient Active Problem List   Diagnosis Date Noted  . Preop cardiovascular exam   . Carotid artery disease   . Left inguinal hernia 07/31/2011  . Umbilical hernia 07/31/2011  . Psoriatic arthritis   . Dyslipidemia   . CAD (coronary artery disease)   . Ejection fraction   . PVC's (premature ventricular contractions)     ROS   Patient  denies fever, chills, headache, sweats, rash, change in vision, change in hearing, chest pain, cough, nausea vomiting, urinary symptoms. All other systems are reviewed and are negative.  PHYSICAL EXAM  Patient is overweight. He stable. There is no jugulovenous distention. Lungs are clear. Respiratory effort is nonlabored. Cardiac exam reveals S1 and S2. There no clicks or significant murmurs. The abdomen is soft. Is no peripheral edema. There no musculoskeletal deformities. There are no skin rashes.  Filed Vitals:   08/01/12 1105  BP: 121/69  Pulse: 96  Height: 5\' 10"  (1.778 m)  Weight: 217 lb (98.431 kg)   I have reviewed the scan copy of the EKG from January, 2014. The QRS remains normal. He has multiple PVCs. There is no change in his EKG from his prior EKG.  ASSESSMENT & PLAN

## 2012-08-01 NOTE — Assessment & Plan Note (Signed)
The patient has known coronary disease. His last exercise study was a nuclear scan in September, 2011. The patient did walk on the treadmill and his PVCs decreased. At this time I will like to proceed with a stress echo. He is to walk on the treadmill. We will see the pattern of his PVCs. In addition this will be made resting left ventricular function and stress data.

## 2012-08-18 ENCOUNTER — Ambulatory Visit: Payer: Medicare Other | Admitting: Cardiology

## 2012-08-29 ENCOUNTER — Ambulatory Visit (HOSPITAL_COMMUNITY): Payer: Medicare Other | Attending: Cardiology

## 2012-08-29 ENCOUNTER — Encounter: Payer: Self-pay | Admitting: Cardiology

## 2012-08-29 ENCOUNTER — Ambulatory Visit (HOSPITAL_BASED_OUTPATIENT_CLINIC_OR_DEPARTMENT_OTHER): Payer: Medicare Other

## 2012-08-29 DIAGNOSIS — I4949 Other premature depolarization: Secondary | ICD-10-CM | POA: Insufficient documentation

## 2012-08-29 DIAGNOSIS — R0989 Other specified symptoms and signs involving the circulatory and respiratory systems: Secondary | ICD-10-CM

## 2012-08-29 DIAGNOSIS — I493 Ventricular premature depolarization: Secondary | ICD-10-CM

## 2012-08-29 DIAGNOSIS — I2510 Atherosclerotic heart disease of native coronary artery without angina pectoris: Secondary | ICD-10-CM

## 2012-08-29 NOTE — Progress Notes (Signed)
Echocardiogram performed.  

## 2012-09-01 NOTE — Progress Notes (Signed)
   Patient had a stress echo June, 2014. There was good increase in LV function the EF going up from 55-65%. There were PVCs at rest. There is report of 6 beat runs of ventricular tachycardia was stressed. I do not know how fast this was. The strips will be reviewed before I get back to the patient to make final decisions about this test.  Jerral Bonito, MD

## 2012-09-15 ENCOUNTER — Telehealth: Payer: Self-pay | Admitting: Cardiology

## 2012-09-15 NOTE — Telephone Encounter (Signed)
New Problem  Pt is wanting the results of his stress test.

## 2012-09-15 NOTE — Telephone Encounter (Signed)
**Note De-Identified  Obfuscation** Pt given test results, he verbalized understanding.

## 2012-10-12 ENCOUNTER — Other Ambulatory Visit: Payer: Self-pay | Admitting: Cardiology

## 2012-12-03 ENCOUNTER — Other Ambulatory Visit: Payer: Self-pay | Admitting: Cardiology

## 2013-01-11 ENCOUNTER — Other Ambulatory Visit: Payer: Self-pay | Admitting: Cardiology

## 2013-01-18 ENCOUNTER — Other Ambulatory Visit: Payer: Self-pay | Admitting: Cardiology

## 2013-07-07 ENCOUNTER — Ambulatory Visit: Payer: Medicare Other | Admitting: Emergency Medicine

## 2013-07-07 VITALS — BP 134/72 | HR 91 | Temp 98.2°F | Resp 18 | Ht 68.0 in | Wt 215.0 lb

## 2013-07-07 DIAGNOSIS — Z0289 Encounter for other administrative examinations: Secondary | ICD-10-CM

## 2013-07-07 NOTE — Progress Notes (Signed)
ua-sp gr-1.015, protein-neg, blood-neg, glucose-neg

## 2013-07-07 NOTE — Progress Notes (Signed)
[facility] [address], [place] [address] [id number] [phone]  Date:  07/07/2013   Name:  Jorge [name]   DOB:  [birth year]   MRN:  [id number]  PCP:  Jorge Pel, MD    Chief Complaint: Employment Physical   History of Present Illness:  Jorge [name] is a 68 y.o. 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  No complaints.  Patient Active Problem List   Diagnosis Date Noted  . Preop cardiovascular exam   . Carotid artery disease   . Left inguinal hernia 07/31/2011  . Umbilical hernia 10/62/6948  . Psoriatic arthritis   . Dyslipidemia   . CAD (coronary artery disease)   . Ejection fraction   . PVC's (premature ventricular contractions)     Past Medical History  Diagnosis Date  . Dyslipidemia   . CAD (coronary artery disease)     PCI d iagonal 2001, residual 60% LAD /  Nuclear 2006, no ischemia  . Ejection fraction     EF normal,  . Carotid artery disease     Doppler, October, 2012, 0-39% bilateral stenoses.  . Psoriatic arthritis     2012  . Arthritis   . Umbilical hernia 5/46/2703  . Preop cardiovascular exam     Patient needs cardiac clearance for hernia surgery May, 2013  . PVC's (premature ventricular contractions)     Frequent PVCs noted over the years., These always decrease with exercise. This is documented  . Eye abnormalities 08-23-2011    right ryr injection for bleed done by dr Zadie Rhine, dr Zadie Rhine told pt ok to have surgery 09-03-2011    Past Surgical History  Procedure Laterality Date  . Carpel tunnel  2009/2010    both wrists  . Angioplasty  12/1999 and 03/2000  . Right knee meniscus repair  1990's  . Inguinal hernia repair  09/03/2011    Procedure: HERNIA REPAIR INGUINAL ADULT;  Surgeon: Jorge [name]. Jorge Dover, MD;  Location: [facility];  Service: General;  Laterality: Left;  Left Ingunal Hernia Repair with Mesh  . Umbilical hernia repair  09/03/2011    Procedure: HERNIA  REPAIR UMBILICAL ADULT;  Surgeon: Jorge [name]. Jorge Dover, MD;  Location: [facility];  Service: General;  Laterality: N/A;    History  Substance Use Topics  . Smoking status: Former Smoker -- 1.50 packs/day for 25 years    Types: Cigarettes    Quit date: 12/08/1994  . Smokeless tobacco: Former Systems developer  . Alcohol Use: Yes     Comment: 1 drink per day    Family History  Problem Relation Age of Onset  . Cancer Mother     uterine  . Cancer Sister     Breast    Allergies  Allergen Reactions  . Niacin Itching    And swelling    Medication list has been reviewed and updated.  Current Outpatient Prescriptions on File Prior to Visit  Medication Sig Dispense Refill  . aspirin 81 MG tablet Take 81 mg by mouth daily with breakfast.       . atorvastatin (LIPITOR) 40 MG tablet Take 40 mg by mouth daily with breakfast.       . clopidogrel (PLAVIX) 75 MG tablet TAKE 1 TABLET BY MOUTH EVERY DAY WITH BREAKFAST  30 tablet  5  . ezetimibe (ZETIA) 10 MG tablet Take 10 mg by mouth daily with breakfast.      . fish oil-omega-3 fatty  acids 1000 MG capsule Take 2 g by mouth daily with breakfast.       . ibuprofen (ADVIL,MOTRIN) 200 MG tablet Take 400 mg by mouth every 6 (six) hours as needed. For pain      . metoprolol succinate (TOPROL-XL) 25 MG 24 hr tablet TAKE ONE-HALF TABLET BY MOUTH EVERY DAY WITH BREAKFAST  15 tablet  5  . Multiple Vitamin (MULTIVITAMIN) capsule Take 1 capsule by mouth daily.        . naproxen sodium (ANAPROX) 220 MG tablet Take 440 mg by mouth 2 (two) times daily as needed.       No current facility-administered medications on file prior to visit.    Review of Systems:  As per HPI, otherwise negative.    Physical Examination: Filed Vitals:   07/07/13 1358  BP: 134/72  Pulse: 91  Temp: 98.2 F (36.8 C)  Resp: 18   Filed Vitals:   07/07/13 1358  Height: 5\' 8"  (1.727 m)  Weight: 215 lb (97.523 kg)   Body mass index is 32.7 kg/(m^2). Ideal Body Weight: Weight in (lb) to  have BMI = 25: 164.1  GEN: WDWN, NAD, Non-toxic, A & O x 3 HEENT: Atraumatic, Normocephalic. Neck supple. No masses, No LAD. Ears and Nose: No external deformity. CV: irregular rhythm with frequent PVCS , No M/G/R. No JVD. No thrill. No extra heart sounds. PULM: CTA B, no wheezes, crackles, rhonchi. No retractions. No resp. distress. No accessory muscle use. ABD: S, NT, ND, +BS. No rebound. No HSM. EXTR: No c/c/e NEURO Normal gait.  PSYCH: Normally interactive. Conversant. Not depressed or anxious appearing.  Calm demeanor.    Assessment and Plan: DOT CAD PVC's Signed,  Ellison Carwin, MD

## 2013-09-02 ENCOUNTER — Encounter (INDEPENDENT_AMBULATORY_CARE_PROVIDER_SITE_OTHER): Payer: Self-pay

## 2013-09-02 ENCOUNTER — Ambulatory Visit (INDEPENDENT_AMBULATORY_CARE_PROVIDER_SITE_OTHER): Payer: Medicare Other | Admitting: Cardiology

## 2013-09-02 ENCOUNTER — Encounter: Payer: Self-pay | Admitting: Cardiology

## 2013-09-02 VITALS — BP 122/70 | HR 64 | Ht 68.0 in | Wt 218.0 lb

## 2013-09-02 DIAGNOSIS — R9430 Abnormal result of cardiovascular function study, unspecified: Secondary | ICD-10-CM

## 2013-09-02 DIAGNOSIS — I493 Ventricular premature depolarization: Secondary | ICD-10-CM

## 2013-09-02 DIAGNOSIS — R0989 Other specified symptoms and signs involving the circulatory and respiratory systems: Secondary | ICD-10-CM

## 2013-09-02 DIAGNOSIS — I2510 Atherosclerotic heart disease of native coronary artery without angina pectoris: Secondary | ICD-10-CM

## 2013-09-02 DIAGNOSIS — E785 Hyperlipidemia, unspecified: Secondary | ICD-10-CM

## 2013-09-02 DIAGNOSIS — I739 Peripheral vascular disease, unspecified: Secondary | ICD-10-CM

## 2013-09-02 DIAGNOSIS — I779 Disorder of arteries and arterioles, unspecified: Secondary | ICD-10-CM

## 2013-09-02 DIAGNOSIS — I4949 Other premature depolarization: Secondary | ICD-10-CM

## 2013-09-02 NOTE — Patient Instructions (Signed)
Your physician recommends that you continue on your current medications as directed. Please refer to the Current Medication list given to you today.  Your physician wants you to follow-up in: 1 year. You will receive a reminder letter in the mail two months in advance. If you don't receive a letter, please call our office to schedule the follow-up appointment.  

## 2013-09-02 NOTE — Assessment & Plan Note (Signed)
The patient has multiple PVCs. I have assessed this carefully over time. I've concern that he might have change in LV function. LV has remained normal. His PVCs do not increase with stress. He has no sign of ischemia with stress echo last year. He has no symptoms. No change in therapy.

## 2013-09-02 NOTE — Assessment & Plan Note (Signed)
The patient had mild disease in 2012. I would like to arrange a followup Doppler for him.

## 2013-09-02 NOTE — Assessment & Plan Note (Signed)
The patient did have a PCI in the past. He had moderate residual LAD disease. His nuclear study in 2011 revealed no ischemia. His stress echo in 2014 revealed no significant ischemia. Plan to continue aspirin and his lipid therapy.

## 2013-09-02 NOTE — Assessment & Plan Note (Signed)
Lipids are being treated. No change in therapy.

## 2013-09-02 NOTE — Progress Notes (Signed)
Patient ID: Jorge [name], male   DOB: April 19, 1945, 68 y.o.   MRN: [id number]    HPI  Patient is seen today for a one-year followup of his coronary disease and PVCs. He has PVCs that are frequent. We have assess this carefully and repeatedly over time. He had a nuclear study in 2013. This showed no ischemia. He had a stress echo in June, 2014. His ejection fraction was 65%. There were no stress-induced wall motion abnormalities. Overall his PVCs remained stable with stress. He does not have syncope or presyncope. He's not having any chest pain or shortness of breath.  Allergies  Allergen Reactions  . Niacin Itching    And swelling    Current Outpatient Prescriptions  Medication Sig Dispense Refill  . aspirin 81 MG tablet Take 81 mg by mouth daily with breakfast.       . atorvastatin (LIPITOR) 40 MG tablet Take 40 mg by mouth daily with breakfast.       . clopidogrel (PLAVIX) 75 MG tablet TAKE 1 TABLET BY MOUTH EVERY DAY WITH BREAKFAST  30 tablet  5  . ezetimibe (ZETIA) 10 MG tablet Take 10 mg by mouth daily with breakfast.      . fish oil-omega-3 fatty acids 1000 MG capsule Take 2 g by mouth daily with breakfast.       . ibuprofen (ADVIL,MOTRIN) 200 MG tablet Take 400 mg by mouth every 6 (six) hours as needed. For pain      . metoprolol succinate (TOPROL-XL) 25 MG 24 hr tablet TAKE ONE-HALF TABLET BY MOUTH EVERY DAY WITH BREAKFAST  15 tablet  5  . Multiple Vitamin (MULTIVITAMIN) capsule Take 1 capsule by mouth daily.        . naproxen sodium (ANAPROX) 220 MG tablet Take 440 mg by mouth 2 (two) times daily as needed.       No current facility-administered medications for this visit.    History   Social History  . Marital Status: Married    Spouse Name: N/A    Number of Children: N/A  . Years of Education: N/A   Occupational History  . Not on file.   Social History Main Topics  . Smoking status: Former Smoker -- 1.50 packs/day for 25 years    Types: Cigarettes    Quit date:  12/08/1994  . Smokeless tobacco: Former Systems developer  . Alcohol Use: Yes     Comment: 1 drink per day  . Drug Use: No  . Sexual Activity: Not on file   Other Topics Concern  . Not on file   Social History Narrative  . No narrative on file    Family History  Problem Relation Age of Onset  . Cancer Mother     uterine  . Cancer Sister     Breast    Past Medical History  Diagnosis Date  . Dyslipidemia   . CAD (coronary artery disease)     PCI d iagonal 2001, residual 60% LAD /  Nuclear 2006, no ischemia  . Ejection fraction     EF normal,  . Carotid artery disease     Doppler, October, 2012, 0-39% bilateral stenoses.  . Psoriatic arthritis     2012  . Arthritis   . Umbilical hernia 4/00/8676  . Preop cardiovascular exam     Patient needs cardiac clearance for hernia surgery May, 2013  . PVC's (premature ventricular contractions)     Frequent PVCs noted over the years., These always decrease with exercise.  This is documented  . Eye abnormalities 08-23-2011    right ryr injection for bleed done by dr Zadie Rhine, dr Zadie Rhine told pt ok to have surgery 09-03-2011    Past Surgical History  Procedure Laterality Date  . Carpel tunnel  2009/2010    both wrists  . Angioplasty  12/1999 and 03/2000  . Right knee meniscus repair  1990's  . Inguinal hernia repair  09/03/2011    Procedure: HERNIA REPAIR INGUINAL ADULT;  Surgeon: Imogene Burn. Georgette Dover, MD;  Location: [facility];  Service: General;  Laterality: Left;  Left Ingunal Hernia Repair with Mesh  . Umbilical hernia repair  09/03/2011    Procedure: HERNIA REPAIR UMBILICAL ADULT;  Surgeon: Imogene Burn. Georgette Dover, MD;  Location: [facility];  Service: General;  Laterality: N/A;    Patient Active Problem List   Diagnosis Date Noted  . Preop cardiovascular exam   . Carotid artery disease   . Left inguinal hernia 07/31/2011  . Umbilical hernia 23/30/0762  . Psoriatic arthritis   . Dyslipidemia   . CAD (coronary artery disease)   . Ejection fraction   . PVC's  (premature ventricular contractions)     ROS   Patient denies fever, chills, headache, sweats, rash, change in vision, change in hearing, chest pain, cough, nausea vomiting, urinary symptoms. All other systems are reviewed and are negative.  PHYSICAL EXAM  Patient is oriented to person time and place. Affect is normal. Head is atraumatic. Sclera and conjunctiva are normal. There is no jugulovenous distention. Lungs are clear. Respiratory effort is nonlabored. Cardiac exam reveals an S1 and S2. There no clicks or significant murmurs. Abdomen is soft. There is no peripheral edema.  Filed Vitals:   09/02/13 0922  BP: 122/70  Pulse: 64  Height: 5\' 8"  (1.727 m)  Weight: 218 lb (98.884 kg)   EKG is done today and reviewed by me. He does have sinus bradycardia with ventricular bigeminy.  ASSESSMENT & PLAN

## 2013-09-02 NOTE — Assessment & Plan Note (Signed)
His ejection fraction is normal by recent echo last year. No change in therapy.

## 2013-10-28 ENCOUNTER — Inpatient Hospital Stay (HOSPITAL_COMMUNITY): Payer: Medicare Other

## 2013-10-28 ENCOUNTER — Encounter (HOSPITAL_COMMUNITY): Payer: Self-pay | Admitting: Emergency Medicine

## 2013-10-28 ENCOUNTER — Inpatient Hospital Stay (HOSPITAL_COMMUNITY)
Admission: EM | Admit: 2013-10-28 | Discharge: 2013-10-30 | DRG: 378 | Disposition: A | Discharge status: Home / Self Care | Payer: Medicare Other | Attending: Internal Medicine | Admitting: Internal Medicine

## 2013-10-28 DIAGNOSIS — Z87891 Personal history of nicotine dependence: Secondary | ICD-10-CM

## 2013-10-28 DIAGNOSIS — I2510 Atherosclerotic heart disease of native coronary artery without angina pectoris: Secondary | ICD-10-CM | POA: Diagnosis present

## 2013-10-28 DIAGNOSIS — Z7982 Long term (current) use of aspirin: Secondary | ICD-10-CM | POA: Diagnosis not present

## 2013-10-28 DIAGNOSIS — N321 Vesicointestinal fistula: Secondary | ICD-10-CM | POA: Diagnosis present

## 2013-10-28 DIAGNOSIS — K5791 Diverticulosis of intestine, part unspecified, without perforation or abscess with bleeding: Secondary | ICD-10-CM

## 2013-10-28 DIAGNOSIS — Z791 Long term (current) use of non-steroidal anti-inflammatories (NSAID): Secondary | ICD-10-CM | POA: Diagnosis not present

## 2013-10-28 DIAGNOSIS — N182 Chronic kidney disease, stage 2 (mild): Secondary | ICD-10-CM | POA: Diagnosis present

## 2013-10-28 DIAGNOSIS — Z7902 Long term (current) use of antithrombotics/antiplatelets: Secondary | ICD-10-CM | POA: Diagnosis not present

## 2013-10-28 DIAGNOSIS — Z803 Family history of malignant neoplasm of breast: Secondary | ICD-10-CM | POA: Diagnosis not present

## 2013-10-28 DIAGNOSIS — E785 Hyperlipidemia, unspecified: Secondary | ICD-10-CM | POA: Diagnosis present

## 2013-10-28 DIAGNOSIS — K5731 Diverticulosis of large intestine without perforation or abscess with bleeding: Principal | ICD-10-CM | POA: Diagnosis present

## 2013-10-28 DIAGNOSIS — K4090 Unilateral inguinal hernia, without obstruction or gangrene, not specified as recurrent: Secondary | ICD-10-CM

## 2013-10-28 DIAGNOSIS — D126 Benign neoplasm of colon, unspecified: Secondary | ICD-10-CM | POA: Diagnosis present

## 2013-10-28 DIAGNOSIS — Z8049 Family history of malignant neoplasm of other genital organs: Secondary | ICD-10-CM

## 2013-10-28 DIAGNOSIS — M545 Low back pain: Secondary | ICD-10-CM

## 2013-10-28 DIAGNOSIS — M549 Dorsalgia, unspecified: Secondary | ICD-10-CM | POA: Diagnosis present

## 2013-10-28 DIAGNOSIS — Z888 Allergy status to other drugs, medicaments and biological substances status: Secondary | ICD-10-CM

## 2013-10-28 DIAGNOSIS — D62 Acute posthemorrhagic anemia: Secondary | ICD-10-CM | POA: Diagnosis present

## 2013-10-28 DIAGNOSIS — K2980 Duodenitis without bleeding: Secondary | ICD-10-CM | POA: Diagnosis present

## 2013-10-28 DIAGNOSIS — I493 Ventricular premature depolarization: Secondary | ICD-10-CM

## 2013-10-28 DIAGNOSIS — R0989 Other specified symptoms and signs involving the circulatory and respiratory systems: Secondary | ICD-10-CM

## 2013-10-28 DIAGNOSIS — L4050 Arthropathic psoriasis, unspecified: Secondary | ICD-10-CM | POA: Diagnosis present

## 2013-10-28 DIAGNOSIS — M546 Pain in thoracic spine: Secondary | ICD-10-CM

## 2013-10-28 DIAGNOSIS — R9430 Abnormal result of cardiovascular function study, unspecified: Secondary | ICD-10-CM

## 2013-10-28 DIAGNOSIS — I772 Rupture of artery: Secondary | ICD-10-CM

## 2013-10-28 DIAGNOSIS — Z01810 Encounter for preprocedural cardiovascular examination: Secondary | ICD-10-CM

## 2013-10-28 DIAGNOSIS — K922 Gastrointestinal hemorrhage, unspecified: Secondary | ICD-10-CM | POA: Diagnosis present

## 2013-10-28 DIAGNOSIS — K921 Melena: Secondary | ICD-10-CM | POA: Diagnosis present

## 2013-10-28 HISTORY — DX: Family history of other specified conditions: Z84.89

## 2013-10-28 LAB — HEMOGLOBIN AND HEMATOCRIT, BLOOD
HCT: 33.6 % — ABNORMAL LOW (ref 39.0–52.0)
HCT: 31.2 % — ABNORMAL LOW (ref 39.0–52.0)
HEMATOCRIT: 28.9 % — AB (ref 39.0–52.0)
Hemoglobin: 11.8 g/dL — ABNORMAL LOW (ref 13.0–17.0)
Hemoglobin: 11.1 g/dL — ABNORMAL LOW (ref 13.0–17.0)
Hemoglobin: 10.3 g/dL — ABNORMAL LOW (ref 13.0–17.0)

## 2013-10-28 LAB — POC OCCULT BLOOD, ED: Fecal Occult Bld: POSITIVE — AB

## 2013-10-28 LAB — CBC WITH DIFFERENTIAL/PLATELET
BASOS ABS: 0 10*3/uL (ref 0.0–0.1)
BASOS PCT: 0 % (ref 0–1)
EOS ABS: 0.3 10*3/uL (ref 0.0–0.7)
Eosinophils Relative: 2 % (ref 0–5)
HCT: 40.1 % (ref 39.0–52.0)
Hemoglobin: 14.1 g/dL (ref 13.0–17.0)
Lymphocytes Relative: 11 % — ABNORMAL LOW (ref 12–46)
Lymphs Abs: 1.4 10*3/uL (ref 0.7–4.0)
MCH: 32.6 pg (ref 26.0–34.0)
MCHC: 35.2 g/dL (ref 30.0–36.0)
MCV: 92.8 fL (ref 78.0–100.0)
Monocytes Absolute: 1.2 10*3/uL — ABNORMAL HIGH (ref 0.1–1.0)
Monocytes Relative: 10 % (ref 3–12)
NEUTROS ABS: 9.8 10*3/uL — AB (ref 1.7–7.7)
NEUTROS PCT: 77 % (ref 43–77)
Platelets: 197 10*3/uL (ref 150–400)
RBC: 4.32 MIL/uL (ref 4.22–5.81)
RDW: 12.6 % (ref 11.5–15.5)
WBC: 12.7 10*3/uL — ABNORMAL HIGH (ref 4.0–10.5)

## 2013-10-28 LAB — GLUCOSE, CAPILLARY
GLUCOSE-CAPILLARY: 79 mg/dL (ref 70–99)
Glucose-Capillary: 133 mg/dL — ABNORMAL HIGH (ref 70–99)
Glucose-Capillary: 100 mg/dL — ABNORMAL HIGH (ref 70–99)

## 2013-10-28 LAB — COMPREHENSIVE METABOLIC PANEL
ALBUMIN: 3.8 g/dL (ref 3.5–5.2)
ALT: 29 U/L (ref 0–53)
ANION GAP: 11 (ref 5–15)
AST: 21 U/L (ref 0–37)
Alkaline Phosphatase: 73 U/L (ref 39–117)
BILIRUBIN TOTAL: 1 mg/dL (ref 0.3–1.2)
BUN: 23 mg/dL (ref 6–23)
CHLORIDE: 103 meq/L (ref 96–112)
CO2: 26 mEq/L (ref 19–32)
CREATININE: 1.26 mg/dL (ref 0.50–1.35)
Calcium: 8.7 mg/dL (ref 8.4–10.5)
GFR calc Af Amer: 65 mL/min — ABNORMAL LOW (ref >90)
GFR calc non Af Amer: 57 mL/min — ABNORMAL LOW (ref >90)
Glucose, Bld: 152 mg/dL — ABNORMAL HIGH (ref 70–99)
POTASSIUM: 4.8 meq/L (ref 3.7–5.3)
Sodium: 140 mEq/L (ref 137–147)
TOTAL PROTEIN: 6.7 g/dL (ref 6.0–8.3)

## 2013-10-28 LAB — TYPE AND SCREEN
ABO/RH(D): A NEG
ANTIBODY SCREEN: NEGATIVE

## 2013-10-28 LAB — APTT: aPTT: 29 seconds (ref 24–37)

## 2013-10-28 LAB — ABO/RH: ABO/RH(D): A NEG

## 2013-10-28 LAB — PROTIME-INR
INR: 1.05 (ref 0.00–1.49)
Prothrombin Time: 13.7 seconds (ref 11.6–15.2)

## 2013-10-28 LAB — MRSA PCR SCREENING: MRSA by PCR: NEGATIVE

## 2013-10-28 MED ORDER — INSULIN ASPART 100 UNIT/ML ~~LOC~~ SOLN
0.0000 [IU] | Freq: Three times a day (TID) | SUBCUTANEOUS | Status: DC
  Administered 2013-10-28: 1 [IU] via SUBCUTANEOUS

## 2013-10-28 MED ORDER — IOHEXOL 350 MG/ML SOLN
100.0000 mL | Freq: Once | INTRAVENOUS | Status: AC | PRN
  Administered 2013-10-28: 100 mL via INTRAVENOUS

## 2013-10-28 MED ORDER — ONDANSETRON HCL 4 MG PO TABS: 4.0000 mg | ORAL_TABLET | Freq: Four times a day (QID) | ORAL | Status: DC | PRN

## 2013-10-28 MED ORDER — PANTOPRAZOLE SODIUM 40 MG IV SOLR
40.0000 mg | Freq: Once | INTRAVENOUS | Status: AC
  Administered 2013-10-28: 40 mg via INTRAVENOUS
  Filled 2013-10-28: qty 40

## 2013-10-28 MED ORDER — SODIUM CHLORIDE 0.9 % IV BOLUS (SEPSIS)
1000.0000 mL | Freq: Once | INTRAVENOUS | Status: AC
  Administered 2013-10-28: 1000 mL via INTRAVENOUS

## 2013-10-28 MED ORDER — SODIUM CHLORIDE 0.9 % IJ SOLN
3.0000 mL | Freq: Two times a day (BID) | INTRAMUSCULAR | Status: DC
  Administered 2013-10-28 (×2): 3 mL via INTRAVENOUS

## 2013-10-28 MED ORDER — ACETAMINOPHEN 325 MG PO TABS
650.0000 mg | ORAL_TABLET | Freq: Four times a day (QID) | ORAL | Status: DC | PRN
  Administered 2013-10-29 – 2013-10-30 (×2): 650 mg via ORAL
  Filled 2013-10-28 (×2): qty 2

## 2013-10-28 MED ORDER — SODIUM CHLORIDE 0.9 % IV SOLN
INTRAVENOUS | Status: AC
  Administered 2013-10-28: 11:00:00 via INTRAVENOUS

## 2013-10-28 MED ORDER — ATORVASTATIN CALCIUM 40 MG PO TABS
40.0000 mg | ORAL_TABLET | Freq: Every day | ORAL | Status: DC
  Administered 2013-10-29: 40 mg via ORAL
  Filled 2013-10-28 (×3): qty 1

## 2013-10-28 MED ORDER — ONDANSETRON HCL 4 MG/2ML IJ SOLN
4.0000 mg | Freq: Four times a day (QID) | INTRAMUSCULAR | Status: DC | PRN
Start: 2013-10-28 — End: 2013-10-30

## 2013-10-28 MED ORDER — MORPHINE SULFATE 2 MG/ML IJ SOLN: 2.0000 mg | INTRAMUSCULAR | Status: DC | PRN

## 2013-10-28 MED ORDER — ACETAMINOPHEN 650 MG RE SUPP: 650.0000 mg | Freq: Four times a day (QID) | RECTAL | Status: DC | PRN

## 2013-10-28 MED ORDER — EZETIMIBE 10 MG PO TABS
10.0000 mg | ORAL_TABLET | Freq: Every day | ORAL | Status: DC
  Administered 2013-10-29: 10 mg via ORAL
  Filled 2013-10-28 (×3): qty 1

## 2013-10-28 MED ORDER — SODIUM CHLORIDE 0.9 % IV SOLN
INTRAVENOUS | Status: DC
  Administered 2013-10-29: 02:00:00 via INTRAVENOUS

## 2013-10-28 MED ORDER — TOFACITINIB CITRATE 5 MG PO TABS
5.0000 mg | ORAL_TABLET | Freq: Every day | ORAL | Status: DC
  Filled 2013-10-28 (×4): qty 1

## 2013-10-28 NOTE — ED Notes (Signed)
Pt resting quietly at the time. Vital signs stable. No signs of distress noted. Family at bedside.

## 2013-10-28 NOTE — ED Provider Notes (Signed)
CSN: [id number]     Arrival date & time 10/28/13  0759 History   First MD Initiated Contact with Patient 10/28/13 0804     Chief Complaint  Patient presents with  . Rectal Bleeding     (Consider location/radiation/quality/duration/timing/severity/associated sxs/prior Treatment) Patient is a 68 y.o. male presenting with hematochezia.  Rectal Bleeding Quality:  Bright red Amount:  Moderate Duration:  1 day Timing:  Constant Progression:  Unchanged Chronicity:  New Context: not anal fissures, not constipation, not diarrhea and not hemorrhoids   Similar prior episodes: no   Relieved by:  Nothing Worsened by:  Nothing tried Ineffective treatments:  None tried Associated symptoms: dizziness and light-headedness   Associated symptoms: no abdominal pain, no fever and no vomiting   Risk factors: anticoagulant use and NSAID use   Risk factors: no hx of IBD    68 yo M with a chief complaint of bright red blood per rectum. This started this morning. Patient has had 4 episodes of this. Patient thinks that fills the bowl. Patient denies any abdominal pain any vomiting. Patient denies any chest pain or shortness breath. Patient states this never happened to him before. Patient with a history of coronary artery disease unable to be stented patient is on Plavix and aspirin at home. Patient with recent increase of his aspirin over the past 2 weeks with no real reasoning. Patient takes NSAIDs chronically for psoriatic arthritis. Patient thinks that there was some dark stool mixed with bright red blood in the bowl.  Past Medical History  Diagnosis Date  . Dyslipidemia   . CAD (coronary artery disease)     PCI d iagonal 2001, residual 60% LAD /  Nuclear 2006, no ischemia  . Ejection fraction     EF normal,  . Carotid artery disease     Doppler, October, 2012, 0-39% bilateral stenoses.  . Psoriatic arthritis     2012  . Arthritis   . Umbilical hernia 3/81/0175  . Preop cardiovascular exam      Patient needs cardiac clearance for hernia surgery May, 2013  . PVC's (premature ventricular contractions)     Frequent PVCs noted over the years., These always decrease with exercise. This is documented  . Eye abnormalities 08-23-2011    right ryr injection for bleed done by dr Zadie Rhine, dr Zadie Rhine told pt ok to have surgery 09-03-2011   Past Surgical History  Procedure Laterality Date  . Carpel tunnel  2009/2010    both wrists  . Angioplasty  12/1999 and 03/2000  . Right knee meniscus repair  1990's  . Inguinal hernia repair  09/03/2011    Procedure: HERNIA REPAIR INGUINAL ADULT;  Surgeon: Imogene Burn. Georgette Dover, MD;  Location: [facility];  Service: General;  Laterality: Left;  Left Ingunal Hernia Repair with Mesh  . Umbilical hernia repair  09/03/2011    Procedure: HERNIA REPAIR UMBILICAL ADULT;  Surgeon: Imogene Burn. Georgette Dover, MD;  Location: [facility];  Service: General;  Laterality: N/A;   Family History  Problem Relation Age of Onset  . Cancer Mother     uterine  . Cancer Sister     Breast  . Colon cancer Neg Hx   . Ulcerative colitis Neg Hx   . Crohn's disease Cousin     distant   History  Substance Use Topics  . Smoking status: Former Smoker -- 1.50 packs/day for 25 years    Types: Cigarettes    Quit date: 12/08/1994  . Smokeless tobacco: Former Systems developer  .  Alcohol Use: Yes     Comment: 2 drink per day    Review of Systems  Constitutional: Negative for fever and chills.  HENT: Negative for congestion and facial swelling.   Eyes: Negative for discharge and visual disturbance.  Respiratory: Negative for shortness of breath.   Cardiovascular: Negative for chest pain and palpitations.  Gastrointestinal: Positive for blood in stool and hematochezia. Negative for nausea, vomiting, abdominal pain and diarrhea.  Musculoskeletal: Negative for arthralgias and myalgias.  Skin: Negative for color change and rash.  Neurological: Positive for dizziness and light-headedness. Negative for tremors, syncope  and headaches.  Psychiatric/Behavioral: Negative for confusion and dysphoric mood.      Allergies  Niacin  Home Medications   Prior to Admission medications   Medication Sig Start Date End Date Taking? Authorizing Provider  aspirin EC 325 MG tablet Take 325 mg by mouth daily.   Yes Historical Provider, MD  atorvastatin (LIPITOR) 40 MG tablet Take 40 mg by mouth daily with breakfast.    Yes Historical Provider, MD  clopidogrel (PLAVIX) 75 MG tablet Take 75 mg by mouth daily.   Yes Historical Provider, MD  ezetimibe (ZETIA) 10 MG tablet Take 10 mg by mouth daily with breakfast.   Yes Historical Provider, MD  fish oil-omega-3 fatty acids 1000 MG capsule Take 2 g by mouth daily with breakfast.    Yes Historical Provider, MD  ibuprofen (ADVIL,MOTRIN) 200 MG tablet Take 400 mg by mouth every 6 (six) hours as needed for moderate pain.    Yes Historical Provider, MD  metoprolol succinate (TOPROL-XL) 25 MG 24 hr tablet Take 12.5 mg by mouth daily.   Yes Historical Provider, MD  Multiple Vitamin (MULTIVITAMIN) capsule Take 1 capsule by mouth daily.     Yes Historical Provider, MD  naproxen sodium (ANAPROX) 220 MG tablet Take 220-440 mg by mouth 2 (two) times daily as needed (pain).    Yes Historical Provider, MD  Tofacitinib Citrate (XELJANZ) 5 MG TABS Take 5 mg by mouth daily.   Yes Historical Provider, MD   BP 119/72  Pulse 83  Temp(Src) 97.5 F (36.4 C) (Oral)  Resp 13  SpO2 100% Physical Exam  Constitutional: He is oriented to person, place, and time. He appears well-developed and well-nourished.  HENT:  Head: Normocephalic and atraumatic.  Eyes: EOM are normal. Pupils are equal, round, and reactive to light.  Neck: Normal range of motion. Neck supple. No JVD present.  Cardiovascular: Normal rate and regular rhythm.  Exam reveals no gallop and no friction rub.   No murmur heard. Pulmonary/Chest: No respiratory distress. He has no wheezes.  Abdominal: He exhibits no distension. There  is no rebound and no guarding.  Genitourinary: Prostate normal. Rectal exam shows no fissure, no mass and no tenderness. Guaiac positive stool.  Musculoskeletal: Normal range of motion.  Neurological: He is alert and oriented to person, place, and time.  Skin: No rash noted. No pallor.  Psychiatric: He has a normal mood and affect. His behavior is normal.    ED Course  Procedures (including critical care time) Labs Review Labs Reviewed  CBC WITH DIFFERENTIAL - Abnormal; Notable for the following:    WBC 12.7 (*)    Neutro Abs 9.8 (*)    Lymphocytes Relative 11 (*)    Monocytes Absolute 1.2 (*)    All other components within normal limits  COMPREHENSIVE METABOLIC PANEL - Abnormal; Notable for the following:    Glucose, Bld 152 (*)    GFR  calc non Af Amer 57 (*)    GFR calc Af Amer 65 (*)    All other components within normal limits  POC OCCULT BLOOD, ED - Abnormal; Notable for the following:    Fecal Occult Bld POSITIVE (*)    All other components within normal limits  PROTIME-INR  APTT  TYPE AND SCREEN  ABO/RH    Imaging Review No results found.   EKG Interpretation   Date/Time:  Wednesday October 28 2013 08:15:56 EDT Ventricular Rate:  88 PR Interval:  147 QRS Duration: 91 QT Interval:  425 QTC Calculation: 514 R Axis:   74 Text Interpretation:  Sinus rhythm Ventricular bigeminy Nonspecific T  abnormalities, lateral leads nonspecific T waves unchanged from 2014  Confirmed by [place]  MD, SCOTT ([id number]) on 10/28/2013 8:30:30 AM      MDM   Final diagnoses:  Aorto-enteric fistula  Midline thoracic back pain  Decreased pedal pulses    68 yo M BRBPR, patient with 4 episodes this morning, HDS on arrival.  On plavix, aspirin.  Taking NSAIDS.  Bright red blood on rectal exam. Obtain cbc, cmp, ptptt, give fluid bolus.    Stable hbg, soft BP's.  Given 2 L of fluid.  Admit to step down.     Deno Etienne, MD 10/28/13 1137

## 2013-10-28 NOTE — ED Notes (Signed)
Attempted to call report, nurse to call back. 

## 2013-10-28 NOTE — ED Notes (Signed)
Pt reports having rectal bleeding x 4 episodes this am and now feeling weak and tired. Pt is on plavix and aspirin.

## 2013-10-28 NOTE — ED Notes (Signed)
Pt reports several bloody bowel movements this morning. Denies abdominal pain. Currently taking Aspirin and Plavix at home. No nausea/vomiting. Bowel sounds present all quadrants. Pt is alert and oriented x4.

## 2013-10-28 NOTE — H&P (Addendum)
[facility] History and Physical  Jorge [name] LFY:[id number] DOB: 04/22/1945 DOA: 10/28/2013  Referring physician:  Tyrone Nine PCP:  Horatio Pel, MD   Chief Complaint:  Rectal bleeding  HPI:  The patient is a 68 y.o. year-old male with history of CAD s/p PCI to diag 2001 and 60% LAD which has undergone angioplasty several times, on ASA and plavix, psoriatic arthritis, bigeminy who presents with rectal bleeding.  The patient was last at their baseline health a few weeks ago.  He has had aching 3/10 low back pain worse with movement for the last couple of weeks.  Pain is relatively constant.  For the last 2-3 days, he has had stabbing pain between his shoulder blades that comes and goes spontaneously, lasting a few seconds at a time.  Otherwise, he was feeling well this morning.  Since 5:30AM, he has had 4 bowel movements that feel like formed stool mixed with liquid bright red blood.  The blood stains the water so that the stool is impossible to see.  The last time was around 7AM, although he feels like he needs to have another BM now.  During his last BM, he became pale and clammy and felt like he may pass out.  His wife encouraged him to come to the ER.  Denies abdominal pain, SOB and is feeling a little better after some IVF in the ER.  Last colonoscopy was in 2009.  Patient is a poor historian and cannot recall if he has ever been told he had diverticulosis.  He had a polyp removed but cannot remember when and is unsure if it was precancerous.  He has never had rectal bleeding.  He has been taking increased aspirin 325mg  daily to use up an old bottle and he takes either naprosyn or ibuprofen on a daily basis (alternates days), but denies hx of GERD, gastritis, PUD, or epigastric discomfort.  Denies nausea/vomiting.    In the ER, HR 70-80s in bigeminy and blood pressures 100/43.  WBC 12.7 and hemoglobin 14.1.  BMP wnl except mild hyperglycemia.  No imaging done in ER.    Review of  Systems:  General:  Denies fevers, chills, weight loss or gain HEENT:  Denies changes to hearing and vision, rhinorrhea, sinus congestion, sore throat CV:  Denies chest pain and palpitations, lower extremity edema.  PULM:  Denies SOB, wheezing, cough.   GI:  Per HPI.  Not usually constipated GU:  Denies dysuria, frequency, urgency ENDO:  Denies polyuria, polydipsia.   HEME:  Denies hematemesis, abnormal bruising LYMPH:  Denies lymphadenopathy.   MSK:  Per HPI DERM:  Rash on left foot NEURO:  Denies focal numbness, weakness, slurred speech, confusion, facial droop.  PSYCH:  Denies anxiety and depression.    Past Medical History  Diagnosis Date  . Dyslipidemia   . CAD (coronary artery disease)     PCI d iagonal 2001, residual 60% LAD /  Nuclear 2006, no ischemia  . Ejection fraction     EF normal,  . Carotid artery disease     Doppler, October, 2012, 0-39% bilateral stenoses.  . Psoriatic arthritis     2012  . Arthritis   . Umbilical hernia 8/52/7782  . Preop cardiovascular exam     Patient needs cardiac clearance for hernia surgery May, 2013  . PVC's (premature ventricular contractions)     Frequent PVCs noted over the years., These always decrease with exercise. This is documented  . Eye abnormalities 08-23-2011    right  ryr injection for bleed done by dr Zadie Rhine, dr Zadie Rhine told pt ok to have surgery 09-03-2011   Past Surgical History  Procedure Laterality Date  . Carpel tunnel  2009/2010    both wrists  . Angioplasty  12/1999 and 03/2000  . Right knee meniscus repair  1990's  . Inguinal hernia repair  09/03/2011    Procedure: HERNIA REPAIR INGUINAL ADULT;  Surgeon: Imogene Burn. Georgette Dover, MD;  Location: [facility];  Service: General;  Laterality: Left;  Left Ingunal Hernia Repair with Mesh  . Umbilical hernia repair  09/03/2011    Procedure: HERNIA REPAIR UMBILICAL ADULT;  Surgeon: Imogene Burn. Georgette Dover, MD;  Location: [facility];  Service: General;  Laterality: N/A;   Social History:  reports  that he quit smoking about 18 years ago. His smoking use included Cigarettes. He has a 37.5 pack-year smoking history. He has quit using smokeless tobacco. He reports that he drinks alcohol. He reports that he does not use illicit drugs. Does not use cane or walker, independent of IDLs  Allergies  Allergen Reactions  . Niacin Itching    And swelling    Family History  Problem Relation Age of Onset  . Cancer Mother     uterine  . Cancer Sister     Breast  . Colon cancer Neg Hx   . Ulcerative colitis Neg Hx   . Crohn's disease Cousin     distant     Prior to Admission medications   Medication Sig Start Date End Date Taking? Authorizing Provider  aspirin EC 325 MG tablet Take 325 mg by mouth daily.   Yes Historical Provider, MD  atorvastatin (LIPITOR) 40 MG tablet Take 40 mg by mouth daily with breakfast.    Yes Historical Provider, MD  clopidogrel (PLAVIX) 75 MG tablet Take 75 mg by mouth daily.   Yes Historical Provider, MD  ezetimibe (ZETIA) 10 MG tablet Take 10 mg by mouth daily with breakfast.   Yes Historical Provider, MD  fish oil-omega-3 fatty acids 1000 MG capsule Take 2 g by mouth daily with breakfast.    Yes Historical Provider, MD  ibuprofen (ADVIL,MOTRIN) 200 MG tablet Take 400 mg by mouth every 6 (six) hours as needed for moderate pain.    Yes Historical Provider, MD  metoprolol succinate (TOPROL-XL) 25 MG 24 hr tablet Take 12.5 mg by mouth daily.   Yes Historical Provider, MD  Multiple Vitamin (MULTIVITAMIN) capsule Take 1 capsule by mouth daily.     Yes Historical Provider, MD  naproxen sodium (ANAPROX) 220 MG tablet Take 220-440 mg by mouth 2 (two) times daily as needed (pain).    Yes Historical Provider, MD  Tofacitinib Citrate (XELJANZ) 5 MG TABS Take 5 mg by mouth daily.   Yes Historical Provider, MD   Physical Exam: Filed Vitals:   10/28/13 1030 10/28/13 1045 10/28/13 1100 10/28/13 1115  BP: 123/67 123/52 115/57 119/72  Pulse: 108 34 62 83  Temp:       TempSrc:      Resp: 13     SpO2: 100% 98% 97% 100%     General:  WM, NAD  Eyes:  PERRL, anicteric, non-injected.  ENT:  Nares clear.  OP clear, non-erythematous without plaques or exudates.  MMM.  Neck:  Supple without TM or JVD.    Lymph:  No cervical, supraclavicular, or submandibular LAD.  Cardiovascular:  RRR, normal S1, S2, without m/r/g.  warm extremities  Respiratory:  CTA bilaterally without increased WOB.  Abdomen:  Hyperactive BS.  Soft, diffusely TTP without rebound or guarding, mildly distended.    Skin:  Mildly erythematous scaling plaques on left medial foot or focal lesions.  Musculoskeletal:  Normal bulk and tone.  No LE edema.  2+ pedal pulse right foot, absent on left foot, but both feet warm.  Slightly delayed CR on left foot compared to right  Psychiatric:  A & O x 4.  Appropriate affect.  Neurologic:  CN 3-12 intact.  5/5 strength.  Sensation intact.  Labs on Admission:  Basic Metabolic Panel:  Recent Labs Lab 10/28/13 0820  NA 140  K 4.8  CL 103  CO2 26  GLUCOSE 152*  BUN 23  CREATININE 1.26  CALCIUM 8.7   Liver Function Tests:  Recent Labs Lab 10/28/13 0820  AST 21  ALT 29  ALKPHOS 73  BILITOT 1.0  PROT 6.7  ALBUMIN 3.8   No results found for this basename: LIPASE, AMYLASE,  in the last 168 hours No results found for this basename: AMMONIA,  in the last 168 hours CBC:  Recent Labs Lab 10/28/13 0820  WBC 12.7*  NEUTROABS 9.8*  HGB 14.1  HCT 40.1  MCV 92.8  PLT 197   Cardiac Enzymes: No results found for this basename: CKTOTAL, CKMB, CKMBINDEX, TROPONINI,  in the last 168 hours  BNP (last 3 results) No results found for this basename: PROBNP,  in the last 8760 hours CBG: No results found for this basename: GLUCAP,  in the last 168 hours  Radiological Exams on Admission: No results found.  EKG: Independently reviewed. 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   -  Repeat hgb in 6 hours -  Type and screen complete -  May need GI consultation if bleeding persists -  If severe bleeding, consider NM bleeding scan  -  Hold ASA and plavix -  D/c NSAIDS -  NPO and IVF -  Have contacted [facility], primary GI MD currently is Dr. 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  Last stress 2011 was wnl -  Telemetry  Sharp back pain -  CT angio to rule out dissection -  Telemetry -  Cycle troponins  Asymmetric pedal pulse, concerning for dissection or PVD -  ABI with duplex  Psoriatic arthritis -  Hold NSAIDS -  Continue xeljanz  Leukocytosis, likely related to stress and developing anemia -  Check CXR and UA  Hyperglycemia, likely stress-induced  -  Check A1c -  Low dose SSI  Diet:  NPO Access:  PIV IVF:  yes Proph:  SCDs  Code Status: full Family Communication: patient, wife, sister Disposition Plan: Admit to stepdown   Time spent: 60 min Janece Canterbury [facility] Pager [phone]  If 7PM-7AM, please contact night-coverage [email or web address] Password [email or web address] 10/28/2013, 11:29 AM

## 2013-10-28 NOTE — Progress Notes (Signed)
10/28/2013 Patient transfer from the ed to 2 central at 1145. He is alert, oriented and ambulatory. Patient voided when got to room, but did not have any blood stool when arrive on unit. Place on monitor in sinus rhythm and central monitoring was called. [facility] RN.

## 2013-10-29 DIAGNOSIS — L4050 Arthropathic psoriasis, unspecified: Secondary | ICD-10-CM

## 2013-10-29 DIAGNOSIS — K922 Gastrointestinal hemorrhage, unspecified: Secondary | ICD-10-CM

## 2013-10-29 DIAGNOSIS — R0989 Other specified symptoms and signs involving the circulatory and respiratory systems: Secondary | ICD-10-CM

## 2013-10-29 DIAGNOSIS — N182 Chronic kidney disease, stage 2 (mild): Secondary | ICD-10-CM | POA: Diagnosis present

## 2013-10-29 DIAGNOSIS — D62 Acute posthemorrhagic anemia: Secondary | ICD-10-CM

## 2013-10-29 LAB — GLUCOSE, CAPILLARY
Glucose-Capillary: 101 mg/dL — ABNORMAL HIGH (ref 70–99)
Glucose-Capillary: 96 mg/dL (ref 70–99)
Glucose-Capillary: 84 mg/dL (ref 70–99)
Glucose-Capillary: 103 mg/dL — ABNORMAL HIGH (ref 70–99)

## 2013-10-29 LAB — CBC
HCT: 31.4 % — ABNORMAL LOW (ref 39.0–52.0)
HCT: 31.5 % — ABNORMAL LOW (ref 39.0–52.0)
Hemoglobin: 10.8 g/dL — ABNORMAL LOW (ref 13.0–17.0)
Hemoglobin: 11 g/dL — ABNORMAL LOW (ref 13.0–17.0)
MCH: 31.8 pg (ref 26.0–34.0)
MCH: 31.6 pg (ref 26.0–34.0)
MCHC: 34.4 g/dL (ref 30.0–36.0)
MCHC: 34.9 g/dL (ref 30.0–36.0)
MCV: 92.4 fL (ref 78.0–100.0)
MCV: 90.5 fL (ref 78.0–100.0)
PLATELETS: 138 10*3/uL — AB (ref 150–400)
PLATELETS: 145 10*3/uL — AB (ref 150–400)
RBC: 3.4 MIL/uL — AB (ref 4.22–5.81)
RBC: 3.48 MIL/uL — ABNORMAL LOW (ref 4.22–5.81)
RDW: 12.7 % (ref 11.5–15.5)
RDW: 12.7 % (ref 11.5–15.5)
WBC: 4.6 10*3/uL (ref 4.0–10.5)
WBC: 4.6 10*3/uL (ref 4.0–10.5)

## 2013-10-29 LAB — BASIC METABOLIC PANEL
Anion gap: 11 (ref 5–15)
BUN: 17 mg/dL (ref 6–23)
CO2: 24 mEq/L (ref 19–32)
Calcium: 8.2 mg/dL — ABNORMAL LOW (ref 8.4–10.5)
Chloride: 107 mEq/L (ref 96–112)
Creatinine, Ser: 1 mg/dL (ref 0.50–1.35)
GFR, EST AFRICAN AMERICAN: 87 mL/min — AB (ref >90)
GFR, EST NON AFRICAN AMERICAN: 75 mL/min — AB (ref >90)
GLUCOSE: 94 mg/dL (ref 70–99)
POTASSIUM: 4.3 meq/L (ref 3.7–5.3)
SODIUM: 142 meq/L (ref 137–147)

## 2013-10-29 LAB — HEMOGLOBIN A1C
HEMOGLOBIN A1C: 5.7 % — AB (ref <5.7)
MEAN PLASMA GLUCOSE: 117 mg/dL — AB (ref <117)

## 2013-10-29 MED ORDER — PEG 3350-KCL-NA BICARB-NACL 420 G PO SOLR
4000.0000 mL | Freq: Once | ORAL | Status: AC
  Administered 2013-10-29: 4000 mL via ORAL
  Filled 2013-10-29: qty 4000

## 2013-10-29 MED ORDER — SODIUM CHLORIDE 0.9 % IV SOLN: INTRAVENOUS | Status: DC

## 2013-10-29 NOTE — Progress Notes (Signed)
Jorge [name] KGU:[id number] DOB: 01/04/1946 DOA: 10/28/2013 PCP: Horatio Pel, MD   Brief narrative: 68 year old WM PMHx coronary artery disease status post PCI on chronic aspirin and Plavix, psoriatic arthritis. Presented to the emergency department with complaints of rectal bleeding. Patient had noticed aching type low back pain graded 3/10 for several weeks relatively constant induration. Over the past 2-3 days more of a stabbing quality between his shoulder blades that waxed and waned duration only a few seconds. On the morning of admission he had 4 bloody bowel movements formed stool mixed in with bright red blood. He reported that while having bowel movement he became pale and clammy and felt like passing out. Endorsed last colonoscopy 2009. Stated had a polyp removed. No prior rectal bleeding. Reported taking increased aspirin as well as NSAIDs.  In the emergency department his heart rate was stable in the 70s with PVC bigeminy. Blood pressure is soft at 100/43. He had mild leukocytosis with a white count of 12,700 and his hemoglobin was 14.1 which was around his baseline.   HPI/Subjective: Sitting on side of bed. No BM since arrival. No abdominal pain reported. No apparent back pain reported.  Assessment/Plan: Active Problems:   GIB (gastrointestinal bleeding) -Awaiting gastroenterology evaluation  -No apparent recurrence of bleeding since admission -Due to presentation of back pain concern for enterovesical fistula but CT chest and abdomen negative -Incidental finding of diverticula on CT abdomen -Continue clear liquids until evaluated by GI -cont to hold ASA and Plavix    Acute blood loss anemia -Hemoglobin has drifted down to 10.8 since admission and after IV fluids -With history of CAD transfusion threshold will be hemoglobin of 8 or less -Platelets have dipped to 138,000 -Follow serial CBC every 12 hours   Back pain -Seems to have resolved -Negative findings on CT chest and abdomen    Decreased pedal pulses -On exam today note both feet warm to touch with a 2+ posterior tibialis pulse on the left and  1-2+ dorsalis pedis on the right -Arterial duplex study pending    CKD (chronic kidney disease), stage II -Stable and renal function at baseline    CAD (coronary artery disease) -No apparent chest pain despite anemia    Psoriatic arthritis -Continue Xeljanz   DVT prophylaxis: SCDs Code Status: Full Family Communication: Wife and family members at bedside Disposition Plan/Expected LOS: Stepdown until confirmed no further active bright red bleeding per rectum   Consultants: Gastroenterology-Dr. Benson Norway called 8/13  Procedures: None  Cultures: None  Antibiotics: None  Objective: Blood pressure 106/45, pulse 88, temperature 98.8 F (37.1 C), temperature source Oral, resp. rate 17, height 5\' 9"  (1.753 m), weight 211 lb (95.709 kg), SpO2 97.00%.  Intake/Output Summary (Last 24 hours) at 10/29/13 1007 Last data filed at 10/29/13 0730  Gross per 24 hour  Intake   1825 ml  Output   1300 ml  Net    525 ml     Exam: Gen: No acute respiratory distress Chest: Clear to auscultation bilaterally without wheezes, rhonchi or crackles, room air Cardiac: Regular rate and rhythm, S1-S2, no rubs murmurs or gallops, no peripheral edema, no JVD-right PT 2+ and left DP 1-2+ Abdomen: Soft nontender nondistended without obvious hepatosplenomegaly, no ascites Extremities: Symmetrical in appearance without cyanosis, clubbing or effusion  Scheduled Meds:  Scheduled Meds: . atorvastatin  40 mg Oral Q breakfast  . ezetimibe  10 mg Oral Q breakfast  .  insulin aspart  0-9 Units Subcutaneous TID WC  . sodium chloride  3 mL Intravenous Q12H  . Tofacitinib Citrate  5 mg Oral Daily   Continuous Infusions: . sodium chloride 75 mL/hr at 10/29/13 0200    Data Reviewed: Basic Metabolic  Panel:  Recent Labs Lab 10/28/13 0820 10/29/13 0650  NA 140 142  K 4.8 4.3  CL 103 107  CO2 26 24  GLUCOSE 152* 94  BUN 23 17  CREATININE 1.26 1.00  CALCIUM 8.7 8.2*   Liver Function Tests:  Recent Labs Lab 10/28/13 0820  AST 21  ALT 29  ALKPHOS 73  BILITOT 1.0  PROT 6.7  ALBUMIN 3.8   No results found for this basename: LIPASE, AMYLASE,  in the last 168 hours No results found for this basename: AMMONIA,  in the last 168 hours CBC:  Recent Labs Lab 10/28/13 0820 10/28/13 1250 10/28/13 1800 10/28/13 2326 10/29/13 0650  WBC 12.7*  --   --   --  4.6  NEUTROABS 9.8*  --   --   --   --   HGB 14.1 11.8* 11.1* 10.3* 10.8*  HCT 40.1 33.6* 31.2* 28.9* 31.4*  MCV 92.8  --   --   --  92.4  PLT 197  --   --   --  138*   Cardiac Enzymes: No results found for this basename: CKTOTAL, CKMB, CKMBINDEX, TROPONINI,  in the last 168 hours BNP (last 3 results) No results found for this basename: PROBNP,  in the last 8760 hours CBG:  Recent Labs Lab 10/28/13 1209 10/28/13 1743 10/28/13 2150 10/29/13 0733  GLUCAP 133* 79 100* 101*    Recent Results (from the past 240 hour(s))  MRSA PCR SCREENING     Status: None   Collection Time    10/28/13 12:08 PM      Result Value Ref Range Status   MRSA by PCR NEGATIVE  NEGATIVE Final   Comment:            The GeneXpert MRSA Assay (FDA     approved for NASAL specimens     only), is one component of a     comprehensive MRSA colonization     surveillance program. It is not     intended to diagnose MRSA     infection nor to guide or     monitor treatment for     MRSA infections.     Studies:  Recent x-ray studies have been reviewed in detail by the Attending Physician  Time spent :      Erin Hearing, [place] [facility] Office  [phone] Pager [phone]   **If unable to reach the above provider after paging please contact the [place] @ [phone]  On-Call/Text Page:      [email or web address]      password  [email or web address]  If 7PM-7AM, please contact night-coverage [email or web address] Password [email or web address] 10/29/2013, 10:07 AM   LOS: 1 day  Examining patient and discussed assessment and plan with ANP Ebony Hail, agree with plan.  Patient with multiple complex medical problems spent > 40 minutes on direct patient care

## 2013-10-29 NOTE — Progress Notes (Signed)
VASCULAR LAB PRELIMINARY  ARTERIAL  ABI completed:  ABIs and pedal waveforms within normal limits    RIGHT    LEFT    PRESSURE WAVEFORM  PRESSURE WAVEFORM  BRACHIAL  Triphasic  BRACHIAL  Triphasic   DP   DP    AT 154 Triphasic  AT 149 Triphasic   PT 153  Triphasic  PT 152 Triphasic   PER   PER    GREAT TOE  NA GREAT TOE  NA    RIGHT LEFT  ABI 1.08 1.07     Addison Freimuth, RVT 10/29/2013, 3:46 PM

## 2013-10-29 NOTE — Consult Note (Signed)
Unassigned Consult  Reason for Consult: Hematochezia Referring Physician: [facility]  Willey Blade Allebach HPI: This is a 68 year old male with a PMH of CAD on ASA and Plavix, psoriatic arthritis, and bigeminy admitted with hematochezia.  The patient states that he had four bloody bowel movements the other morning.  During one of the bowel movements he had a presyncopal episode.  As a result of his symptoms he presented to the ER for further evaluation.  No reports of any abdominal pain and a CT angio of his abdomen was negative for any aortic dissection or aorto-enteric fistula, however, numerous sigmoid diverticula were identified.  In the past he had a colonoscopy in 2009 by Dr. Lajoyce Corners with findings of some polyp(s), but he cannot give any further details.  On a routine basis he takes naproxen and ibuprofen.  No prior history of hematochezia.  His HGB dropped from 14-15 range down to the 11 range and he denies any further bleeding.    Past Medical History  Diagnosis Date  . Dyslipidemia   . CAD (coronary artery disease)     PCI d iagonal 2001, residual 60% LAD /  Nuclear 2006, no ischemia  . Ejection fraction     EF normal,  . Carotid artery disease     Doppler, October, 2012, 0-39% bilateral stenoses.  . Psoriatic arthritis     2012  . Arthritis   . Umbilical hernia 8/46/9629  . Preop cardiovascular exam     Patient needs cardiac clearance for hernia surgery May, 2013  . PVC's (premature ventricular contractions)     Frequent PVCs noted over the years., These always decrease with exercise. This is documented  . Eye abnormalities 08-23-2011    right ryr injection for bleed done by dr Zadie Rhine, dr Zadie Rhine told pt ok to have surgery 09-03-2011  . Family history of anesthesia complication     SISTER HAD MEMORY PROBLEMS POST OP    Past Surgical History  Procedure Laterality Date  . Carpel tunnel  2009/2010    both wrists  . Angioplasty  12/1999 and 03/2000  . Right knee meniscus repair   1990's  . Inguinal hernia repair  09/03/2011    Procedure: HERNIA REPAIR INGUINAL ADULT;  Surgeon: Imogene Burn. Georgette Dover, MD;  Location: [facility];  Service: General;  Laterality: Left;  Left Ingunal Hernia Repair with Mesh  . Umbilical hernia repair  09/03/2011    Procedure: HERNIA REPAIR UMBILICAL ADULT;  Surgeon: Imogene Burn. Georgette Dover, MD;  Location: [facility];  Service: General;  Laterality: N/A;  . Cardiac catheterization  2001    Family History  Problem Relation Age of Onset  . Cancer Mother     uterine  . Cancer Sister     Breast  . Colon cancer Neg Hx   . Ulcerative colitis Neg Hx   . Crohn's disease Cousin     distant    Social History:  reports that he quit smoking about 18 years ago. His smoking use included Cigarettes. He has a 37.5 pack-year smoking history. He has quit using smokeless tobacco. He reports that he drinks alcohol. He reports that he does not use illicit drugs.  Allergies:  Allergies  Allergen Reactions  . Niacin Itching    And swelling    Medications:  Scheduled: . atorvastatin  40 mg Oral Q breakfast  . ezetimibe  10 mg Oral Q breakfast  . insulin aspart  0-9 Units Subcutaneous TID WC  . sodium chloride  3  mL Intravenous Q12H  . Tofacitinib Citrate  5 mg Oral Daily   Continuous: . sodium chloride 75 mL/hr at 10/29/13 0200    Results for orders placed during the hospital encounter of 10/28/13 (from the past 24 hour(s))  GLUCOSE, CAPILLARY     Status: None   Collection Time    10/28/13  5:43 PM      Result Value Ref Range   Glucose-Capillary 79  70 - 99 mg/dL   Comment 1 Notify RN    HEMOGLOBIN AND HEMATOCRIT, BLOOD     Status: Abnormal   Collection Time    10/28/13  6:00 PM      Result Value Ref Range   Hemoglobin 11.1 (*) 13.0 - 17.0 g/dL   HCT 31.2 (*) 39.0 - 52.0 %  GLUCOSE, CAPILLARY     Status: Abnormal   Collection Time    10/28/13  9:50 PM      Result Value Ref Range   Glucose-Capillary 100 (*) 70 - 99 mg/dL  HEMOGLOBIN AND HEMATOCRIT, BLOOD      Status: Abnormal   Collection Time    10/28/13 11:26 PM      Result Value Ref Range   Hemoglobin 10.3 (*) 13.0 - 17.0 g/dL   HCT 28.9 (*) 39.0 - 03.5 %  BASIC METABOLIC PANEL     Status: Abnormal   Collection Time    10/29/13  6:50 AM      Result Value Ref Range   Sodium 142  137 - 147 mEq/L   Potassium 4.3  3.7 - 5.3 mEq/L   Chloride 107  96 - 112 mEq/L   CO2 24  19 - 32 mEq/L   Glucose, Bld 94  70 - 99 mg/dL   BUN 17  6 - 23 mg/dL   Creatinine, Ser 1.00  0.50 - 1.35 mg/dL   Calcium 8.2 (*) 8.4 - 10.5 mg/dL   GFR calc non Af Amer 75 (*) >90 mL/min   GFR calc Af Amer 87 (*) >90 mL/min   Anion gap 11  5 - 15  CBC     Status: Abnormal   Collection Time    10/29/13  6:50 AM      Result Value Ref Range   WBC 4.6  4.0 - 10.5 K/uL   RBC 3.40 (*) 4.22 - 5.81 MIL/uL   Hemoglobin 10.8 (*) 13.0 - 17.0 g/dL   HCT 31.4 (*) 39.0 - 52.0 %   MCV 92.4  78.0 - 100.0 fL   MCH 31.8  26.0 - 34.0 pg   MCHC 34.4  30.0 - 36.0 g/dL   RDW 12.7  11.5 - 15.5 %   Platelets 138 (*) 150 - 400 K/uL  GLUCOSE, CAPILLARY     Status: Abnormal   Collection Time    10/29/13  7:33 AM      Result Value Ref Range   Glucose-Capillary 101 (*) 70 - 99 mg/dL   Comment 1 Notify RN    GLUCOSE, CAPILLARY     Status: None   Collection Time    10/29/13 11:37 AM      Result Value Ref Range   Glucose-Capillary 96  70 - 99 mg/dL     Ct Angio Chest Aortic Dissect W &/or W/o  10/28/2013   CLINICAL DATA:  Back pain. Evaluate for possible aortic dissection or aortoenteric fistula.  EXAM: CT ANGIOGRAPHY CHEST, ABDOMEN AND PELVIS  TECHNIQUE: Multidetector CT imaging through the chest, abdomen and pelvis was performed using  the standard protocol during bolus administration of intravenous contrast. Multiplanar reconstructed images and MIPs were obtained and reviewed to evaluate the vascular anatomy.  CONTRAST:  152mL OMNIPAQUE IOHEXOL 350 MG/ML SOLN  COMPARISON:  None.  FINDINGS: CTA CHEST FINDINGS  Thoracic aorta is normal  in caliber. There is minor atherosclerotic plaque along the arch and lower descending thoracic aorta. Minor plaque is noted at the origin of the left subclavian artery. Remaining aortic branch vessels are widely patent with no plaque.  No aortic dissection.  Heart is normal in size. Moderate coronary artery calcifications. No mediastinal or hilar masses. No adenopathy. No neck base or axillary masses or adenopathy.  Pulmonary arteries are relatively well opacified. No central pulmonary embolus.  Mild paraseptal emphysema in the upper lobes at the apices. There is minor dependent subsegmental atelectasis mostly in the lower lobes and adjacent to the minor right obliques fissure lungs are otherwise clear. No pleural effusion. No pneumothorax.  Review of the MIP images confirms the above findings.  CTA ABDOMEN AND PELVIS FINDINGS  Abdominal aorta is normal in caliber. There is mild partly calcified atherosclerotic plaque without significant stenosis. This extends into the common, internal external iliac arteries, also without stenosis. Partly calcified plaque lies adjacent to the origin of the celiac axis without significant stenosis. There is a separate origin from the aorta for the left gastric artery. Single renal arteries bilaterally are widely patent with minor plaque at their origins. The inferior mesenteric artery is patent.  Two vague low-density areas are noted at the dome of the lateral segment of the left lobe of the liver measuring 11 mm in 12 mm. These could potentially reflect cysts or hemangiomas. Liver is otherwise unremarkable.  Spleen, gallbladder and pancreas are unremarkable. No adrenal masses. Kidneys, ureters and bladder are unremarkable.  No adenopathy.  There is inflammatory change adjacent to a single diverticulum along the mid transverse colon. No abscess or free air. Numerous diverticula are noted along the sigmoid colon with a few other scattered colonic diverticula. No other diverticular  inflammation. No other colonic abnormality. Small bowel is unremarkable. Normal appendix.  There are degenerative changes most evident at L5-S1. No osteoblastic or osteolytic lesions.  Review of the MIP images confirms the above findings.  IMPRESSION: 1. No aortic dissection. No aneurysm. Mild atherosclerotic changes. No significant stenosis. 2. No acute findings in the chest. 3. Single inflamed diverticulum along the anterior mid transverse colon. No free air or abscess. 4. No other acute findings in the abdomen or pelvis.   Electronically Signed   By: Lajean Manes M.D.   On: 10/28/2013 20:37   Ct Angio Abd/pel W/ And/or W/o  10/28/2013   CLINICAL DATA:  Back pain. Evaluate for possible aortic dissection or aortoenteric fistula.  EXAM: CT ANGIOGRAPHY CHEST, ABDOMEN AND PELVIS  TECHNIQUE: Multidetector CT imaging through the chest, abdomen and pelvis was performed using the standard protocol during bolus administration of intravenous contrast. Multiplanar reconstructed images and MIPs were obtained and reviewed to evaluate the vascular anatomy.  CONTRAST:  132mL OMNIPAQUE IOHEXOL 350 MG/ML SOLN  COMPARISON:  None.  FINDINGS: CTA CHEST FINDINGS  Thoracic aorta is normal in caliber. There is minor atherosclerotic plaque along the arch and lower descending thoracic aorta. Minor plaque is noted at the origin of the left subclavian artery. Remaining aortic branch vessels are widely patent with no plaque.  No aortic dissection.  Heart is normal in size. Moderate coronary artery calcifications. No mediastinal or hilar masses. No adenopathy. No  neck base or axillary masses or adenopathy.  Pulmonary arteries are relatively well opacified. No central pulmonary embolus.  Mild paraseptal emphysema in the upper lobes at the apices. There is minor dependent subsegmental atelectasis mostly in the lower lobes and adjacent to the minor right obliques fissure lungs are otherwise clear. No pleural effusion. No pneumothorax.   Review of the MIP images confirms the above findings.  CTA ABDOMEN AND PELVIS FINDINGS  Abdominal aorta is normal in caliber. There is mild partly calcified atherosclerotic plaque without significant stenosis. This extends into the common, internal external iliac arteries, also without stenosis. Partly calcified plaque lies adjacent to the origin of the celiac axis without significant stenosis. There is a separate origin from the aorta for the left gastric artery. Single renal arteries bilaterally are widely patent with minor plaque at their origins. The inferior mesenteric artery is patent.  Two vague low-density areas are noted at the dome of the lateral segment of the left lobe of the liver measuring 11 mm in 12 mm. These could potentially reflect cysts or hemangiomas. Liver is otherwise unremarkable.  Spleen, gallbladder and pancreas are unremarkable. No adrenal masses. Kidneys, ureters and bladder are unremarkable.  No adenopathy.  There is inflammatory change adjacent to a single diverticulum along the mid transverse colon. No abscess or free air. Numerous diverticula are noted along the sigmoid colon with a few other scattered colonic diverticula. No other diverticular inflammation. No other colonic abnormality. Small bowel is unremarkable. Normal appendix.  There are degenerative changes most evident at L5-S1. No osteoblastic or osteolytic lesions.  Review of the MIP images confirms the above findings.  IMPRESSION: 1. No aortic dissection. No aneurysm. Mild atherosclerotic changes. No significant stenosis. 2. No acute findings in the chest. 3. Single inflamed diverticulum along the anterior mid transverse colon. No free air or abscess. 4. No other acute findings in the abdomen or pelvis.   Electronically Signed   By: Lajean Manes M.D.   On: 10/28/2013 20:37    ROS:  As stated above in the HPI otherwise negative.  Blood pressure 120/55, pulse 84, temperature 98 F (36.7 C), temperature source Oral,  resp. rate 18, height 5\' 9"  (1.753 m), weight 211 lb (95.709 kg), SpO2 97.00%.    PE: Gen: NAD, Alert and Oriented HEENT:  [place]/AT, EOMI Neck: Supple, no LAD Lungs: CTA Bilaterally CV: RRR without M/G/R ABM: Soft, NTND, +BS Ext: No C/C/E  Assessment/Plan: 1) Hematochezia. 2) Diverticula. 3) CAD.   I suspect the patient has a diverticular bleed.  He is stable at this time and he had a colonoscopy in the past, but this procedure was a while ago.  With his NSAID use and the hematochezia, it will be prudent to evaluate the patient endoscopically with an EGD at the time of the colonoscopy.  Plan: 1) EGD/Colonoscopy tomorrow.  Charleston Vierling D 10/29/2013, 2:19 PM

## 2013-10-30 ENCOUNTER — Encounter (HOSPITAL_COMMUNITY)
Admission: EM | Disposition: A | Discharge status: Home / Self Care | Payer: Self-pay | Source: Home / Self Care | Attending: Internal Medicine

## 2013-10-30 ENCOUNTER — Encounter (HOSPITAL_COMMUNITY): Payer: Self-pay | Admitting: *Deleted

## 2013-10-30 DIAGNOSIS — M545 Low back pain, unspecified: Secondary | ICD-10-CM

## 2013-10-30 DIAGNOSIS — I2510 Atherosclerotic heart disease of native coronary artery without angina pectoris: Secondary | ICD-10-CM

## 2013-10-30 DIAGNOSIS — K5731 Diverticulosis of large intestine without perforation or abscess with bleeding: Principal | ICD-10-CM

## 2013-10-30 HISTORY — PX: COLONOSCOPY: SHX5424

## 2013-10-30 HISTORY — PX: ESOPHAGOGASTRODUODENOSCOPY: SHX5428

## 2013-10-30 LAB — CBC
HCT: 32 % — ABNORMAL LOW (ref 39.0–52.0)
HEMOGLOBIN: 11.3 g/dL — AB (ref 13.0–17.0)
MCH: 32.5 pg (ref 26.0–34.0)
MCHC: 35.3 g/dL (ref 30.0–36.0)
MCV: 92 fL (ref 78.0–100.0)
Platelets: 133 10*3/uL — ABNORMAL LOW (ref 150–400)
RBC: 3.48 MIL/uL — ABNORMAL LOW (ref 4.22–5.81)
RDW: 12.7 % (ref 11.5–15.5)
WBC: 4.1 10*3/uL (ref 4.0–10.5)

## 2013-10-30 LAB — BASIC METABOLIC PANEL
Anion gap: 11 (ref 5–15)
BUN: 12 mg/dL (ref 6–23)
CO2: 26 mEq/L (ref 19–32)
CREATININE: 0.97 mg/dL (ref 0.50–1.35)
Calcium: 8.5 mg/dL (ref 8.4–10.5)
Chloride: 107 mEq/L (ref 96–112)
GFR calc non Af Amer: 82 mL/min — ABNORMAL LOW (ref >90)
Glucose, Bld: 89 mg/dL (ref 70–99)
Potassium: 4.2 mEq/L (ref 3.7–5.3)
Sodium: 144 mEq/L (ref 137–147)

## 2013-10-30 LAB — GLUCOSE, CAPILLARY
GLUCOSE-CAPILLARY: 83 mg/dL (ref 70–99)
Glucose-Capillary: 100 mg/dL — ABNORMAL HIGH (ref 70–99)
Glucose-Capillary: 105 mg/dL — ABNORMAL HIGH (ref 70–99)

## 2013-10-30 SURGERY — EGD (ESOPHAGOGASTRODUODENOSCOPY)
Anesthesia: Moderate Sedation

## 2013-10-30 MED ORDER — MIDAZOLAM HCL 5 MG/5ML IJ SOLN
INTRAMUSCULAR | Status: DC | PRN
  Administered 2013-10-30 (×3): 2 mg via INTRAVENOUS

## 2013-10-30 MED ORDER — BUTAMBEN-TETRACAINE-BENZOCAINE 2-2-14 % EX AERO
INHALATION_SPRAY | CUTANEOUS | Status: DC | PRN
  Administered 2013-10-30: 2 via TOPICAL

## 2013-10-30 MED ORDER — FENTANYL CITRATE 0.05 MG/ML IJ SOLN
INTRAMUSCULAR | Status: DC | PRN
  Administered 2013-10-30 (×2): 25 ug via INTRAVENOUS

## 2013-10-30 MED ORDER — PANTOPRAZOLE SODIUM 40 MG PO TBEC: 40.0000 mg | DELAYED_RELEASE_TABLET | Freq: Every day | ORAL | Status: AC

## 2013-10-30 MED ORDER — MIDAZOLAM HCL 5 MG/ML IJ SOLN
INTRAMUSCULAR | Status: AC
  Filled 2013-10-30: qty 2

## 2013-10-30 MED ORDER — DIPHENHYDRAMINE HCL 50 MG/ML IJ SOLN
INTRAMUSCULAR | Status: AC
  Filled 2013-10-30: qty 1

## 2013-10-30 MED ORDER — FENTANYL CITRATE 0.05 MG/ML IJ SOLN
INTRAMUSCULAR | Status: AC
  Filled 2013-10-30: qty 2

## 2013-10-30 NOTE — ED Provider Notes (Signed)
I saw and evaluated the patient, reviewed the resident's note and I agree with the findings and plan.   EKG Interpretation   Date/Time:  Wednesday October 28 2013 08:15:56 EDT Ventricular Rate:  88 PR Interval:  147 QRS Duration: 91 QT Interval:  425 QTC Calculation: 514 R Axis:   74 Text Interpretation:  Sinus rhythm Ventricular bigeminy Nonspecific T  abnormalities, lateral leads nonspecific T waves unchanged from 2014  Confirmed by Keron Neenan  MD, Yomara Toothman ([id number]) on 10/28/2013 8:30:30 AM       Patient with multiple bloody stools on plavix. Hgb stable, but BP low 90s. Admit for GI consult and fluids.  Ephraim Hamburger, MD 10/30/13 [phone]

## 2013-10-30 NOTE — Op Note (Signed)
[facility] [place], [id number]   OPERATIVE PROCEDURE REPORT  PATIENT: Jorge [name], Jorge [name].  MR#: [id number] BIRTHDATE: May 13, 1945  GENDER: Male ENDOSCOPIST: Carol Ada, MD ASSISTANT:   William Dalton, technician and Lalla Brothers, RN PROCEDURE DATE: 10/30/2013 PROCEDURE:   EGD w/ biopsy ASA CLASS:   Class III INDICATIONS:Anemia and hematochezia. MEDICATIONS: See the colonoscopy report TOPICAL ANESTHETIC:   none  DESCRIPTION OF PROCEDURE:   After the risks benefits and alternatives of the procedure were thoroughly explained, informed consent was obtained.  The EC-[id number] ([id number])  endoscope was introduced through the mouth  and advanced to the second portion of the duodenum Without limitations.      The instrument was slowly withdrawn as the mucosa was fully examined.    FINDINGS: The esophagus was normal.  In the gastric antrum a couple of small erosions were identified and cold biopsies were obtained. In the duodenal bulb an inflammation was noted.  No evidence of any active bleeding, ulcerations, or vascular abnormalities. Retroflexed views revealed no abnormalities.     The scope was then withdrawn from the patient and the procedure terminated.  COMPLICATIONS: There were no complications.  IMPRESSION: 1) Antral erosions. 2) Duodenitis - Mild.  RECOMMENDATIONS: 1) Await biopsy results. 2) Avoid NSAIDs.  _______________________________ eSignedCarol Ada, MD 10/30/2013 2:57 PM

## 2013-10-30 NOTE — Op Note (Signed)
[facility] [place], [address]   OPERATIVE PROCEDURE REPORT  PATIENT: Jorge [name], Jorge [name].  MR#: [id number] BIRTHDATE: 1946-02-07  GENDER: Male ENDOSCOPIST: Carol Ada, MD ASSISTANT:   William Dalton, technician Lalla Brothers, RN PROCEDURE DATE: 10/30/2013 PROCEDURE:   Colonoscopy with snare polypectomy ASA CLASS:   Class III INDICATIONS:Hematochezia. MEDICATIONS: Versed 6 mg IV and Fentanyl 50 mcg IV  DESCRIPTION OF PROCEDURE:   After the risks benefits and alternatives of the procedure were thoroughly explained, informed consent was obtained.  A digital rectal exam revealed no abnormalities of the rectum.    The Pentax Ped Colon [id number] endoscope was introduced through the anus  and advanced to the cecum, which was identified by both the appendix and ileocecal valve , No adverse events experienced.    The quality of the prep was good. .  The instrument was then slowly withdrawn as the colon was fully examined.     FINDINGS: Two 2 mm sessile transverse colon polyps were removed with a cold snare, but they were not able to be retrieved.  Scattered sigmoid diverticula were identified.  No evidence of any masses, inflammation, ulcerations, erosions, or vascular abnormalities. Retroflexed views revealed no abnormalities.     The scope was then withdrawn from the patient and the procedure terminated.  COMPLICATIONS: There were no complications.  IMPRESSION: 1) Polyps. 2) Diverticula - This is most likely the source of the hematochezia.   RECOMMENDATIONS: 1) Repeat the colonoscopy in 5 years. 2) Okay to D/C home.  _______________________________ eSigned:  Carol Ada, MD 10/30/2013 2:53 PM

## 2013-10-30 NOTE — Interval H&P Note (Signed)
History and Physical Interval Note:  10/30/2013 2:07 PM  Jorge [name]  has presented today for surgery, with the diagnosis of Anemia and Hematochezia  The various methods of treatment have been discussed with the patient and family. After consideration of risks, benefits and other options for treatment, the patient has consented to  Procedure(s): ESOPHAGOGASTRODUODENOSCOPY (EGD) (N/A) COLONOSCOPY (N/A) as a surgical intervention .  The patient's history has been reviewed, patient examined, no change in status, stable for surgery.  I have reviewed the patient's chart and labs.  Questions were answered to the patient's satisfaction.     Gigi Onstad D

## 2013-10-30 NOTE — Discharge Instructions (Signed)
Colon Polyps Polyps are lumps of extra tissue growing inside the body. Polyps can grow in the large intestine (colon). Most colon polyps are noncancerous (benign). However, some colon polyps can become cancerous over time. Polyps that are larger than a pea may be harmful. To be safe, caregivers remove and test all polyps. CAUSES  Polyps form when mutations in the genes cause your cells to grow and divide even though no more tissue is needed. RISK FACTORS There are a number of risk factors that can increase your chances of getting colon polyps. They include:  Being older than 50 years.  Family history of colon polyps or colon cancer.  Long-term colon diseases, such as colitis or Crohn disease.  Being overweight.  Smoking.  Being inactive.  Drinking too much alcohol. SYMPTOMS  Most small polyps do not cause symptoms. If symptoms are present, they may include:  Blood in the stool. The stool may look dark red or black.  Constipation or diarrhea that lasts longer than 1 week. DIAGNOSIS People often do not know they have polyps until their caregiver finds them during a regular checkup. Your caregiver can use 4 tests to check for polyps:  Digital rectal exam. The caregiver wears gloves and feels inside the rectum. This test would find polyps only in the rectum.  Barium enema. The caregiver puts a liquid called barium into your rectum before taking X-rays of your colon. Barium makes your colon look white. Polyps are dark, so they are easy to see in the X-ray pictures.  Sigmoidoscopy. A thin, flexible tube (sigmoidoscope) is placed into your rectum. The sigmoidoscope has a light and tiny camera in it. The caregiver uses the sigmoidoscope to look at the last third of your colon.  Colonoscopy. This test is like sigmoidoscopy, but the caregiver looks at the entire colon. This is the most common method for finding and removing polyps. TREATMENT  Any polyps will be removed during a  sigmoidoscopy or colonoscopy. The polyps are then tested for cancer. PREVENTION  To help lower your risk of getting more colon polyps:  Eat plenty of fruits and vegetables. Avoid eating fatty foods.  Do not smoke.  Avoid drinking alcohol.  Exercise every day.  Lose weight if recommended by your caregiver.  Eat plenty of calcium and folate. Foods that are rich in calcium include milk, cheese, and broccoli. Foods that are rich in folate include chickpeas, kidney beans, and spinach. HOME CARE INSTRUCTIONS Keep all follow-up appointments as directed by your caregiver. You may need periodic exams to check for polyps. SEEK MEDICAL CARE IF: You notice bleeding during a bowel movement. Document Released: 11/30/2003 Document Revised: 05/28/2011 Document Reviewed: 05/15/2011 [facility] Patient Information 2015 [place], [place]. This information is not intended to replace advice given to you by your health care provider. Make sure you discuss any questions you have with your health care provider. Gastrointestinal Bleeding Gastrointestinal bleeding is bleeding somewhere along the path that food travels through the body (digestive tract). This path is anywhere between the mouth and the opening of the butt (anus). You may have blood in your throw up (vomit) or in your poop (stools). If there is a lot of bleeding, you may need to stay in the hospital. [place]  Only take medicine as told by your doctor.  Eat foods with fiber such as whole grains, fruits, and vegetables. You can also try eating 1 to 3 prunes a day.  Drink enough fluids to keep your pee (urine) clear or pale  yellow. GET HELP RIGHT AWAY IF:   Your bleeding gets worse.  You feel dizzy, weak, or you pass out (faint).  You have bad cramps in your back or belly (abdomen).  You have large blood clumps (clots) in your poop.  Your problems are getting worse. MAKE SURE YOU:   Understand these instructions.  Will watch your  condition.  Will get help right away if you are not doing well or get worse. Document Released: 12/13/2007 Document Revised: 02/20/2012 Document Reviewed: 02/12/2011 [facility] Patient Information 2015 [place], [place]. This information is not intended to replace advice given to you by your health care provider. Make sure you discuss any questions you have with your health care provider. Diverticulosis Diverticulosis is the condition that develops when small pouches (diverticula) form in the wall of your colon. Your colon, or large intestine, is where water is absorbed and stool is formed. The pouches form when the inside layer of your colon pushes through weak spots in the outer layers of your colon. CAUSES  No one knows exactly what causes diverticulosis. RISK FACTORS  Being older than 22. Your risk for this condition increases with age. Diverticulosis is rare in people younger than 40 years. By age 51, almost everyone has it.  Eating a low-fiber diet.  Being frequently constipated.  Being overweight.  Not getting enough exercise.  Smoking.  Taking over-the-counter pain medicines, like aspirin and ibuprofen. SYMPTOMS  Most people with diverticulosis do not have symptoms. DIAGNOSIS  Because diverticulosis often has no symptoms, health care providers often discover the condition during an exam for other colon problems. In many cases, a health care provider will diagnose diverticulosis while using a flexible scope to examine the colon (colonoscopy). TREATMENT  If you have never developed an infection related to diverticulosis, you may not need treatment. If you have had an infection before, treatment may include:  Eating more fruits, vegetables, and grains.  Taking a fiber supplement.  Taking a live bacteria supplement (probiotic).  Taking medicine to relax your colon. HOME CARE INSTRUCTIONS   Drink at least 6-8 glasses of water each day to prevent constipation.  Try not to  strain when you have a bowel movement.  Keep all follow-up appointments. If you have had an infection before:  Increase the fiber in your diet as directed by your health care provider or dietitian.  Take a dietary fiber supplement if your health care provider approves.  Only take medicines as directed by your health care provider. SEEK MEDICAL CARE IF:   You have abdominal pain.  You have bloating.  You have cramps.  You have not gone to the bathroom in 3 days. SEEK IMMEDIATE MEDICAL CARE IF:   Your pain gets worse.  Yourbloating becomes very bad.  You have a fever or chills, and your symptoms suddenly get worse.  You begin vomiting.  You have bowel movements that are bloody or black. MAKE SURE YOU:  Understand these instructions.  Will watch your condition.  Will get help right away if you are not doing well or get worse. Document Released: 12/01/2003 Document Revised: 03/10/2013 Document Reviewed: 01/28/2013 [facility] Patient Information 2015 [place], [place]. This information is not intended to replace advice given to you by your health care provider. Make sure you discuss any questions you have with your health care provider.

## 2013-10-30 NOTE — H&P (View-Only) (Signed)
Unassigned Consult  Reason for Consult: Hematochezia Referring Physician: [facility]  Willey Blade Colla HPI: This is a 68 year old male with a PMH of CAD on ASA and Plavix, psoriatic arthritis, and bigeminy admitted with hematochezia.  The patient states that he had four bloody bowel movements the other morning.  During one of the bowel movements he had a presyncopal episode.  As a result of his symptoms he presented to the ER for further evaluation.  No reports of any abdominal pain and a CT angio of his abdomen was negative for any aortic dissection or aorto-enteric fistula, however, numerous sigmoid diverticula were identified.  In the past he had a colonoscopy in 2009 by Dr. Lajoyce Corners with findings of some polyp(s), but he cannot give any further details.  On a routine basis he takes naproxen and ibuprofen.  No prior history of hematochezia.  His HGB dropped from 14-15 range down to the 11 range and he denies any further bleeding.    Past Medical History  Diagnosis Date  . Dyslipidemia   . CAD (coronary artery disease)     PCI d iagonal 2001, residual 60% LAD /  Nuclear 2006, no ischemia  . Ejection fraction     EF normal,  . Carotid artery disease     Doppler, October, 2012, 0-39% bilateral stenoses.  . Psoriatic arthritis     2012  . Arthritis   . Umbilical hernia 1/61/0960  . Preop cardiovascular exam     Patient needs cardiac clearance for hernia surgery May, 2013  . PVC's (premature ventricular contractions)     Frequent PVCs noted over the years., These always decrease with exercise. This is documented  . Eye abnormalities 08-23-2011    right ryr injection for bleed done by dr Zadie Rhine, dr Zadie Rhine told pt ok to have surgery 09-03-2011  . Family history of anesthesia complication     SISTER HAD MEMORY PROBLEMS POST OP    Past Surgical History  Procedure Laterality Date  . Carpel tunnel  2009/2010    both wrists  . Angioplasty  12/1999 and 03/2000  . Right knee meniscus repair   1990's  . Inguinal hernia repair  09/03/2011    Procedure: HERNIA REPAIR INGUINAL ADULT;  Surgeon: Imogene Burn. Georgette Dover, MD;  Location: [facility];  Service: General;  Laterality: Left;  Left Ingunal Hernia Repair with Mesh  . Umbilical hernia repair  09/03/2011    Procedure: HERNIA REPAIR UMBILICAL ADULT;  Surgeon: Imogene Burn. Georgette Dover, MD;  Location: [facility];  Service: General;  Laterality: N/A;  . Cardiac catheterization  2001    Family History  Problem Relation Age of Onset  . Cancer Mother     uterine  . Cancer Sister     Breast  . Colon cancer Neg Hx   . Ulcerative colitis Neg Hx   . Crohn's disease Cousin     distant    Social History:  reports that he quit smoking about 18 years ago. His smoking use included Cigarettes. He has a 37.5 pack-year smoking history. He has quit using smokeless tobacco. He reports that he drinks alcohol. He reports that he does not use illicit drugs.  Allergies:  Allergies  Allergen Reactions  . Niacin Itching    And swelling    Medications:  Scheduled: . atorvastatin  40 mg Oral Q breakfast  . ezetimibe  10 mg Oral Q breakfast  . insulin aspart  0-9 Units Subcutaneous TID WC  . sodium chloride  3  mL Intravenous Q12H  . Tofacitinib Citrate  5 mg Oral Daily   Continuous: . sodium chloride 75 mL/hr at 10/29/13 0200    Results for orders placed during the hospital encounter of 10/28/13 (from the past 24 hour(s))  GLUCOSE, CAPILLARY     Status: None   Collection Time    10/28/13  5:43 PM      Result Value Ref Range   Glucose-Capillary 79  70 - 99 mg/dL   Comment 1 Notify RN    HEMOGLOBIN AND HEMATOCRIT, BLOOD     Status: Abnormal   Collection Time    10/28/13  6:00 PM      Result Value Ref Range   Hemoglobin 11.1 (*) 13.0 - 17.0 g/dL   HCT 31.2 (*) 39.0 - 52.0 %  GLUCOSE, CAPILLARY     Status: Abnormal   Collection Time    10/28/13  9:50 PM      Result Value Ref Range   Glucose-Capillary 100 (*) 70 - 99 mg/dL  HEMOGLOBIN AND HEMATOCRIT, BLOOD      Status: Abnormal   Collection Time    10/28/13 11:26 PM      Result Value Ref Range   Hemoglobin 10.3 (*) 13.0 - 17.0 g/dL   HCT 28.9 (*) 39.0 - 54.6 %  BASIC METABOLIC PANEL     Status: Abnormal   Collection Time    10/29/13  6:50 AM      Result Value Ref Range   Sodium 142  137 - 147 mEq/L   Potassium 4.3  3.7 - 5.3 mEq/L   Chloride 107  96 - 112 mEq/L   CO2 24  19 - 32 mEq/L   Glucose, Bld 94  70 - 99 mg/dL   BUN 17  6 - 23 mg/dL   Creatinine, Ser 1.00  0.50 - 1.35 mg/dL   Calcium 8.2 (*) 8.4 - 10.5 mg/dL   GFR calc non Af Amer 75 (*) >90 mL/min   GFR calc Af Amer 87 (*) >90 mL/min   Anion gap 11  5 - 15  CBC     Status: Abnormal   Collection Time    10/29/13  6:50 AM      Result Value Ref Range   WBC 4.6  4.0 - 10.5 K/uL   RBC 3.40 (*) 4.22 - 5.81 MIL/uL   Hemoglobin 10.8 (*) 13.0 - 17.0 g/dL   HCT 31.4 (*) 39.0 - 52.0 %   MCV 92.4  78.0 - 100.0 fL   MCH 31.8  26.0 - 34.0 pg   MCHC 34.4  30.0 - 36.0 g/dL   RDW 12.7  11.5 - 15.5 %   Platelets 138 (*) 150 - 400 K/uL  GLUCOSE, CAPILLARY     Status: Abnormal   Collection Time    10/29/13  7:33 AM      Result Value Ref Range   Glucose-Capillary 101 (*) 70 - 99 mg/dL   Comment 1 Notify RN    GLUCOSE, CAPILLARY     Status: None   Collection Time    10/29/13 11:37 AM      Result Value Ref Range   Glucose-Capillary 96  70 - 99 mg/dL     Ct Angio Chest Aortic Dissect W &/or W/o  10/28/2013   CLINICAL DATA:  Back pain. Evaluate for possible aortic dissection or aortoenteric fistula.  EXAM: CT ANGIOGRAPHY CHEST, ABDOMEN AND PELVIS  TECHNIQUE: Multidetector CT imaging through the chest, abdomen and pelvis was performed using  the standard protocol during bolus administration of intravenous contrast. Multiplanar reconstructed images and MIPs were obtained and reviewed to evaluate the vascular anatomy.  CONTRAST:  132mL OMNIPAQUE IOHEXOL 350 MG/ML SOLN  COMPARISON:  None.  FINDINGS: CTA CHEST FINDINGS  Thoracic aorta is normal  in caliber. There is minor atherosclerotic plaque along the arch and lower descending thoracic aorta. Minor plaque is noted at the origin of the left subclavian artery. Remaining aortic branch vessels are widely patent with no plaque.  No aortic dissection.  Heart is normal in size. Moderate coronary artery calcifications. No mediastinal or hilar masses. No adenopathy. No neck base or axillary masses or adenopathy.  Pulmonary arteries are relatively well opacified. No central pulmonary embolus.  Mild paraseptal emphysema in the upper lobes at the apices. There is minor dependent subsegmental atelectasis mostly in the lower lobes and adjacent to the minor right obliques fissure lungs are otherwise clear. No pleural effusion. No pneumothorax.  Review of the MIP images confirms the above findings.  CTA ABDOMEN AND PELVIS FINDINGS  Abdominal aorta is normal in caliber. There is mild partly calcified atherosclerotic plaque without significant stenosis. This extends into the common, internal external iliac arteries, also without stenosis. Partly calcified plaque lies adjacent to the origin of the celiac axis without significant stenosis. There is a separate origin from the aorta for the left gastric artery. Single renal arteries bilaterally are widely patent with minor plaque at their origins. The inferior mesenteric artery is patent.  Two vague low-density areas are noted at the dome of the lateral segment of the left lobe of the liver measuring 11 mm in 12 mm. These could potentially reflect cysts or hemangiomas. Liver is otherwise unremarkable.  Spleen, gallbladder and pancreas are unremarkable. No adrenal masses. Kidneys, ureters and bladder are unremarkable.  No adenopathy.  There is inflammatory change adjacent to a single diverticulum along the mid transverse colon. No abscess or free air. Numerous diverticula are noted along the sigmoid colon with a few other scattered colonic diverticula. No other diverticular  inflammation. No other colonic abnormality. Small bowel is unremarkable. Normal appendix.  There are degenerative changes most evident at L5-S1. No osteoblastic or osteolytic lesions.  Review of the MIP images confirms the above findings.  IMPRESSION: 1. No aortic dissection. No aneurysm. Mild atherosclerotic changes. No significant stenosis. 2. No acute findings in the chest. 3. Single inflamed diverticulum along the anterior mid transverse colon. No free air or abscess. 4. No other acute findings in the abdomen or pelvis.   Electronically Signed   By: Lajean Manes M.D.   On: 10/28/2013 20:37   Ct Angio Abd/pel W/ And/or W/o  10/28/2013   CLINICAL DATA:  Back pain. Evaluate for possible aortic dissection or aortoenteric fistula.  EXAM: CT ANGIOGRAPHY CHEST, ABDOMEN AND PELVIS  TECHNIQUE: Multidetector CT imaging through the chest, abdomen and pelvis was performed using the standard protocol during bolus administration of intravenous contrast. Multiplanar reconstructed images and MIPs were obtained and reviewed to evaluate the vascular anatomy.  CONTRAST:  156mL OMNIPAQUE IOHEXOL 350 MG/ML SOLN  COMPARISON:  None.  FINDINGS: CTA CHEST FINDINGS  Thoracic aorta is normal in caliber. There is minor atherosclerotic plaque along the arch and lower descending thoracic aorta. Minor plaque is noted at the origin of the left subclavian artery. Remaining aortic branch vessels are widely patent with no plaque.  No aortic dissection.  Heart is normal in size. Moderate coronary artery calcifications. No mediastinal or hilar masses. No adenopathy. No  neck base or axillary masses or adenopathy.  Pulmonary arteries are relatively well opacified. No central pulmonary embolus.  Mild paraseptal emphysema in the upper lobes at the apices. There is minor dependent subsegmental atelectasis mostly in the lower lobes and adjacent to the minor right obliques fissure lungs are otherwise clear. No pleural effusion. No pneumothorax.   Review of the MIP images confirms the above findings.  CTA ABDOMEN AND PELVIS FINDINGS  Abdominal aorta is normal in caliber. There is mild partly calcified atherosclerotic plaque without significant stenosis. This extends into the common, internal external iliac arteries, also without stenosis. Partly calcified plaque lies adjacent to the origin of the celiac axis without significant stenosis. There is a separate origin from the aorta for the left gastric artery. Single renal arteries bilaterally are widely patent with minor plaque at their origins. The inferior mesenteric artery is patent.  Two vague low-density areas are noted at the dome of the lateral segment of the left lobe of the liver measuring 11 mm in 12 mm. These could potentially reflect cysts or hemangiomas. Liver is otherwise unremarkable.  Spleen, gallbladder and pancreas are unremarkable. No adrenal masses. Kidneys, ureters and bladder are unremarkable.  No adenopathy.  There is inflammatory change adjacent to a single diverticulum along the mid transverse colon. No abscess or free air. Numerous diverticula are noted along the sigmoid colon with a few other scattered colonic diverticula. No other diverticular inflammation. No other colonic abnormality. Small bowel is unremarkable. Normal appendix.  There are degenerative changes most evident at L5-S1. No osteoblastic or osteolytic lesions.  Review of the MIP images confirms the above findings.  IMPRESSION: 1. No aortic dissection. No aneurysm. Mild atherosclerotic changes. No significant stenosis. 2. No acute findings in the chest. 3. Single inflamed diverticulum along the anterior mid transverse colon. No free air or abscess. 4. No other acute findings in the abdomen or pelvis.   Electronically Signed   By: Lajean Manes M.D.   On: 10/28/2013 20:37    ROS:  As stated above in the HPI otherwise negative.  Blood pressure 120/55, pulse 84, temperature 98 F (36.7 C), temperature source Oral,  resp. rate 18, height 5\' 9"  (1.753 m), weight 211 lb (95.709 kg), SpO2 97.00%.    PE: Gen: NAD, Alert and Oriented HEENT:  [place]/AT, EOMI Neck: Supple, no LAD Lungs: CTA Bilaterally CV: RRR without M/G/R ABM: Soft, NTND, +BS Ext: No C/C/E  Assessment/Plan: 1) Hematochezia. 2) Diverticula. 3) CAD.   I suspect the patient has a diverticular bleed.  He is stable at this time and he had a colonoscopy in the past, but this procedure was a while ago.  With his NSAID use and the hematochezia, it will be prudent to evaluate the patient endoscopically with an EGD at the time of the colonoscopy.  Plan: 1) EGD/Colonoscopy tomorrow.  Thatiana Renbarger D 10/29/2013, 2:19 PM

## 2013-10-30 NOTE — Discharge Summary (Signed)
Physician Discharge Summary  Jorge [name] FWY:[id number] DOB: 04-14-1945 DOA: 10/28/2013  PCP: Horatio Pel, MD  Admit date: 10/28/2013 Discharge date: 10/30/2013  Time spent: 30 minutes  Recommendations for Outpatient Follow-up:  1. Follow up with Dr. Benson Norway as directed  2. Call your PCP to arrange post hospital visit for 1-2 weeks after discharge 3. Recommend recheck CBC at follow up visit since on Plavix and noted stable thrombocytopenia during this admission  Discharge Diagnoses:  Active Problems:   GIB (gastrointestinal bleeding) due to diverticular bleed   Acute blood loss anemia from GIB- did not require transfusion   Mild duodenitis/antral erosions   Colon polyps   Back pain-resolved   Decreased pedal pulses with normal ABIs and arterial duplex   CKD (chronic kidney disease), stage II   CAD (coronary artery disease)   Psoriatic arthritis on Xeljanz   Discharge Condition: stable  Diet recommendation: Heart Healthy-Carbohydrate modified  Filed Weights   10/28/13 1305  Weight: 211 lb (95.709 kg)    History of present illness:  68 year old WM PMHx coronary artery disease status post PCI on chronic aspirin and Plavix, psoriatic arthritis. Presented to the emergency department with complaints of rectal bleeding. Patient had noticed aching type low back pain graded 3/10 for several weeks relatively constant induration. Over the past 2-3 days more of a stabbing quality between his shoulder blades that waxed and waned duration only a few seconds. On the morning of admission he had 4 bloody bowel movements formed stool mixed in with bright red blood. He reported that while having bowel movement he became pale and clammy and felt like passing out. Endorsed last colonoscopy 2009. Stated had a polyp removed. No prior rectal bleeding. Reported taking increased aspirin as well as NSAIDs.   In the emergency department his heart rate was stable in the 70s with PVC  bigeminy. Blood pressure is soft at 100/43. He had mild leukocytosis with a white count of 12,700 and his hemoglobin was 14.1 which was around his baseline.   Hospital Course:  GIB (gastrointestinal bleeding)  -No recurrence of bleeding since admission  -Due to presentation of back pain there was concern for enterovesical fistula but CT chest and abdomen was negative  -Incidental finding of diverticula on CT abdomen  -ASA and Plavix held at admission but per GI OK to resume upon discharge otherwise no NSAIDs -EGD: Antral erosions. Duodenitis - Mild. Biopsies pending-starting Protonix upon discharge -Colonoscopy: Polyps. Diverticula - This is most likely the source of the hematochezia. Biopsy results pending. Will need colonoscopy in 5 years  Acute blood loss anemia  -Hemoglobin had drifted down to 10.8 after IV fluids and had stabilized on 8/14 at 11.3 -Did not require transfusion this admisison -Platelets have been low around 130,000 - 140,000 range-recommend repeat CBC after discharge since on Plavix  Back pain  -Resolved  -Negative findings on CT chest and abdomen   Decreased pedal pulses  -On exam today note both feet warm to touch with a 2+ posterior tibialis pulse on the left and 1-2+ dorsalis pedis on the right  -Arterial duplex study within normal limits  CKD (chronic kidney disease), stage II  -Stable and renal function at baseline   CAD (coronary artery disease)  -No apparent chest pain despite anemia   Psoriatic arthritis  -Continue Xeljanz   Procedures:  8/14 EGD  8/14 Colonoscopy  Consultations:  Gastroenterology/Dr. Benson Norway  Discharge Exam: Filed Vitals:   10/30/13 1457  BP: 93/47  Pulse: 85  Temp:   Resp: 11   Gen: No acute respiratory distress  Chest: Clear to auscultation bilaterally without wheezes, rhonchi or crackles, room air  Cardiac: Regular rate and rhythm, S1-S2, no rubs murmurs or gallops, no peripheral edema, no JVD-right PT 2+ and left  DP 1-2+  Abdomen: Soft nontender nondistended without obvious hepatosplenomegaly, no ascites  Extremities: Symmetrical in appearance without cyanosis, clubbing or effusion    Discharge Instructions You were cared for by a hospitalist during your hospital stay. If you have any questions about your discharge medications or the care you received while you were in the hospital after you are discharged, you can call the unit and asked to speak with the hospitalist on call if the hospitalist that took care of you is not available. Once you are discharged, your primary care physician will handle any further medical issues. Please note that NO REFILLS for any discharge medications will be authorized once you are discharged, as it is imperative that you return to your primary care physician (or establish a relationship with a primary care physician if you do not have one) for your aftercare needs so that they can reassess your need for medications and monitor your lab values.      Discharge Instructions   Call MD for:  extreme fatigue    Complete by:  As directed      Call MD for:  persistant dizziness or light-headedness    Complete by:  As directed      Call MD for:  persistant nausea and vomiting    Complete by:  As directed      Call MD for:  temperature >100.4    Complete by:  As directed      Call MD for:    Complete by:  As directed   If bleeding returns     Diet - low sodium heart healthy    Complete by:  As directed      Diet Carb Modified    Complete by:  As directed      Discharge instructions    Complete by:  As directed   No NSAIDs (Ibuprofen, Advil, Naprosyn etc.)- if not sure ask your doctor or the phamacist     Increase activity slowly    Complete by:  As directed             Medication List    STOP taking these medications       ibuprofen 200 MG tablet  Commonly known as:  ADVIL,MOTRIN     naproxen sodium 220 MG tablet  Commonly known as:  ANAPROX      TAKE  these medications       aspirin EC 325 MG tablet  Take 325 mg by mouth daily.     atorvastatin 40 MG tablet  Commonly known as:  LIPITOR  Take 40 mg by mouth daily with breakfast.     clopidogrel 75 MG tablet  Commonly known as:  PLAVIX  Take 75 mg by mouth daily.     ezetimibe 10 MG tablet  Commonly known as:  ZETIA  Take 10 mg by mouth daily with breakfast.     fish oil-omega-3 fatty acids 1000 MG capsule  Take 2 g by mouth daily with breakfast.     metoprolol succinate 25 MG 24 hr tablet  Commonly known as:  TOPROL-XL  Take 12.5 mg by mouth daily.     multivitamin capsule  Take 1 capsule by mouth daily.  pantoprazole 40 MG tablet  Commonly known as:  PROTONIX  Take 1 tablet (40 mg total) by mouth daily.     XELJANZ 5 MG Tabs  Generic drug:  Tofacitinib Citrate  Take 5 mg by mouth daily.       Allergies  Allergen Reactions  . Niacin Itching    And swelling   Follow-up Information   Schedule an appointment as soon as possible for a visit with Horatio Pel, MD.   Specialty:  Internal Medicine   Contact information:   [address] [place] [address] [phone]       Schedule an appointment as soon as possible for a visit with HUNG,PATRICK D, MD. (to follow up on polyp biopsy pathology results- will need another colonoscopy in 5 years)    Specialty:  Gastroenterology   Contact information:   [address] [place] [address] [phone]        The results of significant diagnostics from this hospitalization (including imaging, microbiology, ancillary and laboratory) are listed below for reference.    Significant Diagnostic Studies: Ct Angio Chest Aortic Dissect W &/or W/o  10/28/2013   CLINICAL DATA:  Back pain. Evaluate for possible aortic dissection or aortoenteric fistula.  EXAM: CT ANGIOGRAPHY CHEST, ABDOMEN AND PELVIS  TECHNIQUE: Multidetector CT imaging through the chest, abdomen and pelvis was  performed using the standard protocol during bolus administration of intravenous contrast. Multiplanar reconstructed images and MIPs were obtained and reviewed to evaluate the vascular anatomy.  CONTRAST:  168mL OMNIPAQUE IOHEXOL 350 MG/ML SOLN  COMPARISON:  None.  FINDINGS: CTA CHEST FINDINGS  Thoracic aorta is normal in caliber. There is minor atherosclerotic plaque along the arch and lower descending thoracic aorta. Minor plaque is noted at the origin of the left subclavian artery. Remaining aortic branch vessels are widely patent with no plaque.  No aortic dissection.  Heart is normal in size. Moderate coronary artery calcifications. No mediastinal or hilar masses. No adenopathy. No neck base or axillary masses or adenopathy.  Pulmonary arteries are relatively well opacified. No central pulmonary embolus.  Mild paraseptal emphysema in the upper lobes at the apices. There is minor dependent subsegmental atelectasis mostly in the lower lobes and adjacent to the minor right obliques fissure lungs are otherwise clear. No pleural effusion. No pneumothorax.  Review of the MIP images confirms the above findings.  CTA ABDOMEN AND PELVIS FINDINGS  Abdominal aorta is normal in caliber. There is mild partly calcified atherosclerotic plaque without significant stenosis. This extends into the common, internal external iliac arteries, also without stenosis. Partly calcified plaque lies adjacent to the origin of the celiac axis without significant stenosis. There is a separate origin from the aorta for the left gastric artery. Single renal arteries bilaterally are widely patent with minor plaque at their origins. The inferior mesenteric artery is patent.  Two vague low-density areas are noted at the dome of the lateral segment of the left lobe of the liver measuring 11 mm in 12 mm. These could potentially reflect cysts or hemangiomas. Liver is otherwise unremarkable.  Spleen, gallbladder and pancreas are unremarkable. No  adrenal masses. Kidneys, ureters and bladder are unremarkable.  No adenopathy.  There is inflammatory change adjacent to a single diverticulum along the mid transverse colon. No abscess or free air. Numerous diverticula are noted along the sigmoid colon with a few other scattered colonic diverticula. No other diverticular inflammation. No other colonic abnormality. Small bowel is unremarkable. Normal appendix.  There are  degenerative changes most evident at L5-S1. No osteoblastic or osteolytic lesions.  Review of the MIP images confirms the above findings.  IMPRESSION: 1. No aortic dissection. No aneurysm. Mild atherosclerotic changes. No significant stenosis. 2. No acute findings in the chest. 3. Single inflamed diverticulum along the anterior mid transverse colon. No free air or abscess. 4. No other acute findings in the abdomen or pelvis.   Electronically Signed   By: Lajean Manes M.D.   On: 10/28/2013 20:37   Ct Angio Abd/pel W/ And/or W/o  10/28/2013   CLINICAL DATA:  Back pain. Evaluate for possible aortic dissection or aortoenteric fistula.  EXAM: CT ANGIOGRAPHY CHEST, ABDOMEN AND PELVIS  TECHNIQUE: Multidetector CT imaging through the chest, abdomen and pelvis was performed using the standard protocol during bolus administration of intravenous contrast. Multiplanar reconstructed images and MIPs were obtained and reviewed to evaluate the vascular anatomy.  CONTRAST:  140mL OMNIPAQUE IOHEXOL 350 MG/ML SOLN  COMPARISON:  None.  FINDINGS: CTA CHEST FINDINGS  Thoracic aorta is normal in caliber. There is minor atherosclerotic plaque along the arch and lower descending thoracic aorta. Minor plaque is noted at the origin of the left subclavian artery. Remaining aortic branch vessels are widely patent with no plaque.  No aortic dissection.  Heart is normal in size. Moderate coronary artery calcifications. No mediastinal or hilar masses. No adenopathy. No neck base or axillary masses or adenopathy.  Pulmonary  arteries are relatively well opacified. No central pulmonary embolus.  Mild paraseptal emphysema in the upper lobes at the apices. There is minor dependent subsegmental atelectasis mostly in the lower lobes and adjacent to the minor right obliques fissure lungs are otherwise clear. No pleural effusion. No pneumothorax.  Review of the MIP images confirms the above findings.  CTA ABDOMEN AND PELVIS FINDINGS  Abdominal aorta is normal in caliber. There is mild partly calcified atherosclerotic plaque without significant stenosis. This extends into the common, internal external iliac arteries, also without stenosis. Partly calcified plaque lies adjacent to the origin of the celiac axis without significant stenosis. There is a separate origin from the aorta for the left gastric artery. Single renal arteries bilaterally are widely patent with minor plaque at their origins. The inferior mesenteric artery is patent.  Two vague low-density areas are noted at the dome of the lateral segment of the left lobe of the liver measuring 11 mm in 12 mm. These could potentially reflect cysts or hemangiomas. Liver is otherwise unremarkable.  Spleen, gallbladder and pancreas are unremarkable. No adrenal masses. Kidneys, ureters and bladder are unremarkable.  No adenopathy.  There is inflammatory change adjacent to a single diverticulum along the mid transverse colon. No abscess or free air. Numerous diverticula are noted along the sigmoid colon with a few other scattered colonic diverticula. No other diverticular inflammation. No other colonic abnormality. Small bowel is unremarkable. Normal appendix.  There are degenerative changes most evident at L5-S1. No osteoblastic or osteolytic lesions.  Review of the MIP images confirms the above findings.  IMPRESSION: 1. No aortic dissection. No aneurysm. Mild atherosclerotic changes. No significant stenosis. 2. No acute findings in the chest. 3. Single inflamed diverticulum along the anterior  mid transverse colon. No free air or abscess. 4. No other acute findings in the abdomen or pelvis.   Electronically Signed   By: Lajean Manes M.D.   On: 10/28/2013 20:37    Microbiology: Recent Results (from the past 240 hour(s))  MRSA PCR SCREENING     Status: None  Collection Time    10/28/13 12:08 PM      Result Value Ref Range Status   MRSA by PCR NEGATIVE  NEGATIVE Final   Comment:            The GeneXpert MRSA Assay (FDA     approved for NASAL specimens     only), is one component of a     comprehensive MRSA colonization     surveillance program. It is not     intended to diagnose MRSA     infection nor to guide or     monitor treatment for     MRSA infections.     Labs: Basic Metabolic Panel:  Recent Labs Lab 10/28/13 0820 10/29/13 0650 10/30/13 0315  NA 140 142 144  K 4.8 4.3 4.2  CL 103 107 107  CO2 26 24 26   GLUCOSE 152* 94 89  BUN 23 17 12   CREATININE 1.26 1.00 0.97  CALCIUM 8.7 8.2* 8.5   Liver Function Tests:  Recent Labs Lab 10/28/13 0820  AST 21  ALT 29  ALKPHOS 73  BILITOT 1.0  PROT 6.7  ALBUMIN 3.8   No results found for this basename: LIPASE, AMYLASE,  in the last 168 hours No results found for this basename: AMMONIA,  in the last 168 hours CBC:  Recent Labs Lab 10/28/13 0820  10/28/13 1800 10/28/13 2326 10/29/13 0650 10/29/13 1630 10/30/13 0315  WBC 12.7*  --   --   --  4.6 4.6 4.1  NEUTROABS 9.8*  --   --   --   --   --   --   HGB 14.1  < > 11.1* 10.3* 10.8* 11.0* 11.3*  HCT 40.1  < > 31.2* 28.9* 31.4* 31.5* 32.0*  MCV 92.8  --   --   --  92.4 90.5 92.0  PLT 197  --   --   --  138* 145* 133*  < > = values in this interval not displayed. Cardiac Enzymes: No results found for this basename: CKTOTAL, CKMB, CKMBINDEX, TROPONINI,  in the last 168 hours BNP: BNP (last 3 results) No results found for this basename: PROBNP,  in the last 8760 hours CBG:  Recent Labs Lab 10/29/13 1137 10/29/13 1724 10/29/13 2205  10/30/13 0812 10/30/13 1150  GLUCAP 96 84 103* 100* 83       Signed:  ELLIS,ALLISON L.  ANP [facility] 10/30/2013, 3:15 PM Examined patient with ANP Ebony Hail, discussed assessment and plan and agree with the above plan. Patient with multiple complex medical problems> 40 minutes spent on direct patient care

## 2013-11-02 ENCOUNTER — Encounter (HOSPITAL_COMMUNITY): Payer: Self-pay | Admitting: Gastroenterology

## 2013-11-09 ENCOUNTER — Other Ambulatory Visit: Payer: Self-pay | Admitting: Cardiology

## 2014-01-24 ENCOUNTER — Ambulatory Visit (INDEPENDENT_AMBULATORY_CARE_PROVIDER_SITE_OTHER): Payer: Medicare Other | Admitting: Emergency Medicine

## 2014-01-24 VITALS — BP 120/72 | HR 54 | Temp 98.1°F | Resp 16 | Ht 68.5 in | Wt 210.4 lb

## 2014-01-24 DIAGNOSIS — B029 Zoster without complications: Secondary | ICD-10-CM

## 2014-01-24 MED ORDER — HYDROCODONE-ACETAMINOPHEN 5-325 MG PO TABS: 1.0000 | ORAL_TABLET | ORAL | Status: DC | PRN

## 2014-01-24 MED ORDER — VALACYCLOVIR HCL 1 G PO TABS: 1000.0000 mg | ORAL_TABLET | Freq: Two times a day (BID) | ORAL | Status: DC

## 2014-01-24 NOTE — Progress Notes (Signed)
[facility] [address],  [place] [address] [phone]  Date:  01/24/2014   Name:  Jorge [name]   DOB:  11/14/1945   MRN:  [id number]  PCP:  Horatio Pel, MD    Chief Complaint: Arm Pain; Hand Pain; Ear Pain; Scalp Sensitive; and Rash   History of Present Illness:  Jorge [name] is a 68 y.o. very pleasant male patient who presents with the following:  Seen Thursday by FMD with pain in right arm.  Had negative ekg and CXR.   Now pain has worsened and extended in C6-7 dermatome and has a rash Pain is significant Had shingles vaccine No improvement with over the counter medications or other home remedies.  Denies other complaint or health concern today.   Patient Active Problem List   Diagnosis Date Noted  . Acute blood loss anemia 10/29/2013  . CKD (chronic kidney disease), stage II 10/29/2013  . GIB (gastrointestinal bleeding) 10/28/2013  . Back pain 10/28/2013  . Decreased pedal pulses 10/28/2013  . Preop cardiovascular exam   . Carotid artery disease   . Left inguinal hernia 07/31/2011  . Umbilical hernia 37/85/8850  . Psoriatic arthritis   . Dyslipidemia   . CAD (coronary artery disease)   . Ejection fraction   . PVC's (premature ventricular contractions)     Past Medical History  Diagnosis Date  . Dyslipidemia   . CAD (coronary artery disease)     PCI d iagonal 2001, residual 60% LAD /  Nuclear 2006, no ischemia  . Ejection fraction     EF normal,  . Carotid artery disease     Doppler, October, 2012, 0-39% bilateral stenoses.  . Psoriatic arthritis     2012  . Arthritis   . Umbilical hernia 2/77/4128  . Preop cardiovascular exam     Patient needs cardiac clearance for hernia surgery May, 2013  . PVC's (premature ventricular contractions)     Frequent PVCs noted over the years., These always decrease with exercise. This is documented  . Eye abnormalities 08-23-2011    right ryr injection for bleed done by dr  Zadie Rhine, dr Zadie Rhine told pt ok to have surgery 09-03-2011  . Family history of anesthesia complication     SISTER HAD MEMORY PROBLEMS POST OP  . Diverticulitis     Past Surgical History  Procedure Laterality Date  . Carpel tunnel  2009/2010    both wrists  . Angioplasty  12/1999 and 03/2000  . Right knee meniscus repair  1990's  . Inguinal hernia repair  09/03/2011    Procedure: HERNIA REPAIR INGUINAL ADULT;  Surgeon: Imogene Burn. Georgette Dover, MD;  Location: [facility];  Service: General;  Laterality: Left;  Left Ingunal Hernia Repair with Mesh  . Umbilical hernia repair  09/03/2011    Procedure: HERNIA REPAIR UMBILICAL ADULT;  Surgeon: Imogene Burn. Georgette Dover, MD;  Location: [facility];  Service: General;  Laterality: N/A;  . Cardiac catheterization  2001  . Esophagogastroduodenoscopy N/A 10/30/2013    Procedure: ESOPHAGOGASTRODUODENOSCOPY (EGD);  Surgeon: Beryle Beams, MD;  Location: [facility];  Service: Endoscopy;  Laterality: N/A;  . Colonoscopy N/A 10/30/2013    Procedure: COLONOSCOPY;  Surgeon: Beryle Beams, MD;  Location: [facility];  Service: Endoscopy;  Laterality: N/A;    History  Substance Use Topics  . Smoking status: Former Smoker -- 1.50 packs/day for 25 years    Types: Cigarettes    Quit date: 12/08/1994  . Smokeless tobacco: Former Systems developer  .  Alcohol Use: Yes     Comment: 2 drink per day    Family History  Problem Relation Age of Onset  . Cancer Mother     uterine  . Cancer Sister     Breast  . Colon cancer Neg Hx   . Ulcerative colitis Neg Hx   . Crohn's disease Cousin     distant    Allergies  Allergen Reactions  . Niacin Itching    And swelling    Medication list has been reviewed and updated.  Current Outpatient Prescriptions on File Prior to Visit  Medication Sig Dispense Refill  . aspirin EC 325 MG tablet Take 325 mg by mouth daily.    Marland Kitchen atorvastatin (LIPITOR) 40 MG tablet Take 40 mg by mouth daily with breakfast.     . clopidogrel (PLAVIX) 75 MG tablet Take 75  mg by mouth daily.    . clopidogrel (PLAVIX) 75 MG tablet TAKE 1 TABLET BY MOUTH EVERY DAY WITH BREAKFAST 30 tablet 5  . ezetimibe (ZETIA) 10 MG tablet Take 10 mg by mouth daily with breakfast.    . fish oil-omega-3 fatty acids 1000 MG capsule Take 2 g by mouth daily with breakfast.     . metoprolol succinate (TOPROL-XL) 25 MG 24 hr tablet Take 12.5 mg by mouth daily.    . Multiple Vitamin (MULTIVITAMIN) capsule Take 1 capsule by mouth daily.      . pantoprazole (PROTONIX) 40 MG tablet Take 1 tablet (40 mg total) by mouth daily. 30 tablet 1  . Tofacitinib Citrate (XELJANZ) 5 MG TABS Take 5 mg by mouth daily.     No current facility-administered medications on file prior to visit.    Review of Systems:  As per HPI, otherwise negative.    Physical Examination: Filed Vitals:   01/24/14 1135  BP: 120/72  Pulse: 54  Temp: 98.1 F (36.7 C)  Resp: 16   Filed Vitals:   01/24/14 1135  Height: 5' 8.5" (1.74 m)  Weight: 210 lb 6 oz (95.425 kg)   Body mass index is 31.52 kg/(m^2). Ideal Body Weight: Weight in (lb) to have BMI = 25: 166.5   GEN: WDWN, NAD, Non-toxic, Alert & Oriented x 3 HEENT: Atraumatic, Normocephalic.  Ears and Nose: No external deformity. EXTR: No clubbing/cyanosis/edema NEURO: Normal gait.  PSYCH: Normally interactive. Conversant. Not depressed or anxious appearing.  Calm demeanor.  Erythematous vesicular rash in C6-7 distribution   Assessment and Plan: Shingles Valtrex vicodin  Signed,  Ellison Carwin, MD

## 2014-01-24 NOTE — Patient Instructions (Signed)

## 2014-04-11 ENCOUNTER — Other Ambulatory Visit: Payer: Self-pay | Admitting: Cardiology

## 2014-07-27 ENCOUNTER — Other Ambulatory Visit: Payer: Self-pay | Admitting: Cardiology

## 2014-09-17 ENCOUNTER — Encounter: Payer: Self-pay | Admitting: Cardiology

## 2014-09-17 ENCOUNTER — Ambulatory Visit (INDEPENDENT_AMBULATORY_CARE_PROVIDER_SITE_OTHER): Payer: Medicare Other | Admitting: Cardiology

## 2014-09-17 VITALS — BP 120/70 | HR 72 | Ht 68.5 in | Wt 218.0 lb

## 2014-09-17 DIAGNOSIS — I493 Ventricular premature depolarization: Secondary | ICD-10-CM | POA: Diagnosis not present

## 2014-09-17 DIAGNOSIS — I2510 Atherosclerotic heart disease of native coronary artery without angina pectoris: Secondary | ICD-10-CM

## 2014-09-17 DIAGNOSIS — I779 Disorder of arteries and arterioles, unspecified: Secondary | ICD-10-CM | POA: Diagnosis not present

## 2014-09-17 DIAGNOSIS — K5793 Diverticulitis of intestine, part unspecified, without perforation or abscess with bleeding: Secondary | ICD-10-CM

## 2014-09-17 DIAGNOSIS — I739 Peripheral vascular disease, unspecified: Secondary | ICD-10-CM

## 2014-09-17 NOTE — Assessment & Plan Note (Signed)
The patient has had PVCs historically. He has had normal left ventricular function. He had no increase in PVCs with stress. In 2014 his PVCs remained steady during his stress echo. He does not have symptoms. There is no syncope or presyncope. Two-dimensional echo will be done to reassess LV function. If there has been change in his LV function, more aggressive workup will be needed. Otherwise we will continue the same approach to his care.

## 2014-09-17 NOTE — Assessment & Plan Note (Signed)
He had an episode of bleeding from diverticular disease in August, 2015. He's had no recurrent problems.

## 2014-09-17 NOTE — Progress Notes (Signed)
Cardiology Office Note   Date:  09/17/2014   ID:  Jorge [name], Jorge [name] [birth year], MRN [id number]  PCP:  Horatio Pel, MD  Cardiologist:  Dola Argyle, MD   Chief Complaint  Patient presents with  . Appointment    Follow-up coronary artery disease      History of Present Illness: Jorge [name] is a 69 y.o. male who presents today to follow-up coronary disease and PVCs. 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 This has been assessed carefully and repeatedly over time. Nuclear study in 2013 revealed no ischemia. Stress echo in 2014 revealed no ischemia. EF at that time was 65%. Overall his PVCs remained stable at stress. He does not have syncope or presyncope.  The patient was evaluated for GI bleeding in August, 2015. It is my understanding that this was a diverticular bleed. He also had some antral gastritis. There was no need for transfusion. He was cleared to use aspirin and Plavix as needed. He was encouraged to not use non-steroidal anti-inflammatory meds. He is been on Plavix since 2001. Because he has had some bleeding over time, I feel it is safe to now stop his Plavix and continue his aspirin.  He's feeling well. He is not having any chest pain or significant shortness of breath. He does not feel his PVCs.      Past Medical History  Diagnosis Date  . Dyslipidemia   . CAD (coronary artery disease)     PCI d iagonal 2001, residual 60% LAD /  Nuclear 2006, no ischemia  . Ejection fraction     EF normal,  . Carotid artery disease     Doppler, October, 2012, 0-39% bilateral stenoses.  . Psoriatic arthritis     2012  . Arthritis   . Umbilical hernia 07/13/621  . Preop cardiovascular exam     Patient needs cardiac clearance for hernia surgery May, 2013  . PVC's (premature ventricular contractions)     Frequent PVCs noted over the years., These always decrease with exercise. This is documented  . Eye abnormalities 08-23-2011    right ryr injection for bleed  done by dr Zadie Rhine, dr Zadie Rhine told pt ok to have surgery 09-03-2011  . Family history of anesthesia complication     SISTER HAD MEMORY PROBLEMS POST OP  . Diverticulitis     Past Surgical History  Procedure Laterality Date  . Carpel tunnel  2009/2010    both wrists  . Angioplasty  12/1999 and 03/2000  . Right knee meniscus repair  1990's  . Inguinal hernia repair  09/03/2011    Procedure: HERNIA REPAIR INGUINAL ADULT;  Surgeon: Imogene Burn. Georgette Dover, MD;  Location: [facility];  Service: General;  Laterality: Left;  Left Ingunal Hernia Repair with Mesh  . Umbilical hernia repair  09/03/2011    Procedure: HERNIA REPAIR UMBILICAL ADULT;  Surgeon: Imogene Burn. Georgette Dover, MD;  Location: [facility];  Service: General;  Laterality: N/A;  . Cardiac catheterization  2001  . Esophagogastroduodenoscopy N/A 10/30/2013    Procedure: ESOPHAGOGASTRODUODENOSCOPY (EGD);  Surgeon: Beryle Beams, MD;  Location: [facility];  Service: Endoscopy;  Laterality: N/A;  . Colonoscopy N/A 10/30/2013    Procedure: COLONOSCOPY;  Surgeon: Beryle Beams, MD;  Location: [facility];  Service: Endoscopy;  Laterality: N/A;    Patient Active Problem List   Diagnosis Date Noted  . Acute blood loss anemia 10/29/2013  . CKD (chronic kidney disease), stage II 10/29/2013  . GIB (gastrointestinal bleeding) 10/28/2013  . Back  pain 10/28/2013  . Decreased pedal pulses 10/28/2013  . Preop cardiovascular exam   . Carotid artery disease   . Left inguinal hernia 07/31/2011  . Umbilical hernia 03/50/0938  . Psoriatic arthritis   . Dyslipidemia   . CAD (coronary artery disease)   . Ejection fraction   . PVC's (premature ventricular contractions)       Current Outpatient Prescriptions  Medication Sig Dispense Refill  . aspirin EC 325 MG tablet Take 325 mg by mouth daily.    Marland Kitchen atorvastatin (LIPITOR) 40 MG tablet Take 40 mg by mouth daily with breakfast.     . clopidogrel (PLAVIX) 75 MG tablet Take 75 mg by mouth daily.    . clopidogrel  (PLAVIX) 75 MG tablet TAKE 1 TABLET BY MOUTH EVERY DAY WITH BREAKFAST 30 tablet 1  . ezetimibe (ZETIA) 10 MG tablet Take 10 mg by mouth daily with breakfast.    . fish oil-omega-3 fatty acids 1000 MG capsule Take 2 g by mouth daily with breakfast.     . HYDROcodone-acetaminophen (NORCO) 5-325 MG per tablet Take 1-2 tablets by mouth every 4 (four) hours as needed. (Patient taking differently: Take 1-2 tablets by mouth every 4 (four) hours as needed for moderate pain. ) 30 tablet 0  . metoprolol succinate (TOPROL-XL) 25 MG 24 hr tablet Take 12.5 mg by mouth daily.    . metoprolol succinate (TOPROL-XL) 25 MG 24 hr tablet TAKE 1/2 TABLET BY MOUTH EVERY DAY WITH BREAKFAST 15 tablet 5  . Multiple Vitamin (MULTIVITAMIN) capsule Take 1 capsule by mouth daily.      . pantoprazole (PROTONIX) 40 MG tablet Take 1 tablet (40 mg total) by mouth daily. 30 tablet 1  . Tofacitinib Citrate (XELJANZ) 5 MG TABS Take 5 mg by mouth daily.    . valACYclovir (VALTREX) 1000 MG tablet Take 1 tablet (1,000 mg total) by mouth 2 (two) times daily. 20 tablet 0   No current facility-administered medications for this visit.    Allergies:   Niacin    Social History:  The patient  reports that he quit smoking about 19 years ago. His smoking use included Cigarettes. He has a 37.5 pack-year smoking history. He has quit using smokeless tobacco. He reports that he drinks alcohol. He reports that he does not use illicit drugs.   Family History:  The patient's family history includes Cancer in his mother and sister; Crohn's disease in his cousin. There is no history of Colon cancer or Ulcerative colitis.    ROS:  Please see the history of present illness.   Patient denies fever, chills, headache, sweats, rash, change in vision, change in hearing, chest pain, cough, nausea or vomiting, urinary symptoms. All other systems are reviewed and are negative.   PHYSICAL EXAM: VS:  Ht 5' 8.5" (1.74 m) , The patient is oriented to person  time and place. Affect is normal. Head is atraumatic. Sclera and conjunctiva are normal. There is no jugulovenous distention. Lungs are clear. Respiratory effort is not labored. Cardiac exam reveals S1 and S2. The abdomen is soft. There is no peripheral edema. There are no musculoskeletal deformities. There are no skin rashes. Neurologic is grossly intact.  EKG:   EKG is done today and reviewed by me. His underlying QRS is normal. He has PVCs including ventricular couplets. Recent Labs: 10/28/2013: ALT 29 10/30/2013: BUN 12; Creatinine, Ser 0.97; Hemoglobin 11.3*; Platelets 133*; Potassium 4.2; Sodium 144    Lipid Panel No results found for: CHOL, TRIG, HDL,  CHOLHDL, VLDL, LDLCALC, LDLDIRECT    Wt Readings from Last 3 Encounters:  01/24/14 210 lb 6 oz (95.425 kg)  10/28/13 211 lb (95.709 kg)  09/02/13 218 lb (98.884 kg)      Current medicines are reviewed  The patient understands his medications.   ASSESSMENT AND PLAN:

## 2014-09-17 NOTE — Patient Instructions (Signed)
**Note De-Identified  Obfuscation** Medication Instructions:  Stop taking Plavix  Labwork: None  Testing/Procedures: Your physician has requested that you have an echocardiogram. Echocardiography is a painless test that uses sound waves to create images of your heart. It provides your doctor with information about the size and shape of your heart and how well your heart's chambers and valves are working. This procedure takes approximately one hour. There are no restrictions for this procedure.  Your physician has requested that you have a carotid duplex. This test is an ultrasound of the carotid arteries in your neck. It looks at blood flow through these arteries that supply the brain with blood. Allow one hour for this exam. There are no restrictions or special instructions.   Follow-Up: Your physician wants you to follow-up in: 6 months. You will receive a reminder letter in the mail two months in advance. If you don't receive a letter, please call our office to schedule the follow-up appointment.   Any Other Special Instructions Will Be Listed Below (If Applicable).

## 2014-09-17 NOTE — Assessment & Plan Note (Signed)
There is a history of very slight carotid disease in the past. He will need a follow-up Doppler.

## 2014-09-17 NOTE — Assessment & Plan Note (Addendum)
Coronary disease is stable. He had a PCI to a diagonal in 2001. There was residual 60% LAD disease. Nuclear study in 2016 and 2011 revealed the question of very slight ischemia. Stress echo in 2014 revealed no significant obvious ischemia. Ejection fraction is normal. He does not need any stress testing at this time. Because he had GI bleeding from diverticular disease, I've chosen to stop the Plavix that he has been on long-term. He will continue aspirin.

## 2014-09-24 ENCOUNTER — Ambulatory Visit (HOSPITAL_BASED_OUTPATIENT_CLINIC_OR_DEPARTMENT_OTHER): Payer: Medicare Other

## 2014-09-24 ENCOUNTER — Ambulatory Visit (HOSPITAL_COMMUNITY): Payer: Medicare Other | Attending: Cardiology

## 2014-09-24 ENCOUNTER — Other Ambulatory Visit: Payer: Self-pay

## 2014-09-24 DIAGNOSIS — I6523 Occlusion and stenosis of bilateral carotid arteries: Secondary | ICD-10-CM | POA: Diagnosis not present

## 2014-09-24 DIAGNOSIS — I493 Ventricular premature depolarization: Secondary | ICD-10-CM

## 2014-09-24 DIAGNOSIS — I779 Disorder of arteries and arterioles, unspecified: Secondary | ICD-10-CM

## 2014-09-24 DIAGNOSIS — I351 Nonrheumatic aortic (valve) insufficiency: Secondary | ICD-10-CM | POA: Insufficient documentation

## 2014-09-24 DIAGNOSIS — E785 Hyperlipidemia, unspecified: Secondary | ICD-10-CM | POA: Diagnosis not present

## 2014-09-24 DIAGNOSIS — I2510 Atherosclerotic heart disease of native coronary artery without angina pectoris: Secondary | ICD-10-CM | POA: Insufficient documentation

## 2014-09-24 DIAGNOSIS — I739 Peripheral vascular disease, unspecified: Secondary | ICD-10-CM

## 2014-10-01 ENCOUNTER — Other Ambulatory Visit: Payer: Self-pay | Admitting: Cardiology

## 2014-10-18 ENCOUNTER — Other Ambulatory Visit: Payer: Self-pay | Admitting: Cardiology

## 2015-03-24 ENCOUNTER — Encounter: Payer: Self-pay | Admitting: Cardiology

## 2015-03-24 ENCOUNTER — Ambulatory Visit (INDEPENDENT_AMBULATORY_CARE_PROVIDER_SITE_OTHER): Payer: Medicare Other | Admitting: Cardiology

## 2015-03-24 VITALS — BP 118/70 | HR 62 | Ht 69.0 in | Wt 219.0 lb

## 2015-03-24 DIAGNOSIS — I2583 Coronary atherosclerosis due to lipid rich plaque: Secondary | ICD-10-CM

## 2015-03-24 DIAGNOSIS — E785 Hyperlipidemia, unspecified: Secondary | ICD-10-CM | POA: Diagnosis not present

## 2015-03-24 DIAGNOSIS — I779 Disorder of arteries and arterioles, unspecified: Secondary | ICD-10-CM | POA: Diagnosis not present

## 2015-03-24 DIAGNOSIS — I2510 Atherosclerotic heart disease of native coronary artery without angina pectoris: Secondary | ICD-10-CM

## 2015-03-24 DIAGNOSIS — I493 Ventricular premature depolarization: Secondary | ICD-10-CM | POA: Diagnosis not present

## 2015-03-24 DIAGNOSIS — I739 Peripheral vascular disease, unspecified: Secondary | ICD-10-CM

## 2015-03-24 NOTE — Progress Notes (Signed)
Cardiology Office Note    Date:  03/24/2015   ID:  Jorge [name] 09-14-1945, MRN [id number]  PCP:  Jorge Pel, MD  Cardiologist:   Jorge Furbish, MD (Former Jorge [name])    History of Present Illness:  Jorge [name] is a 70 y.o. male who presents today to follow-up coronary disease and PVCs. 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 This has been assessed carefully and repeatedly over time. Nuclear study in 2013 revealed no ischemia. Stress echo in 2014 revealed no ischemia. EF at that time was 65%. Overall his PVCs remained stable at stress. He does not have syncope or presyncope.  The patient was evaluated for GI bleeding in August, 2015. It is my understanding that this was a diverticular bleed. He also had some antral gastritis. There was no need for transfusion. He was cleared to use aspirin and Plavix as needed. He was encouraged to not use non-steroidal anti-inflammatory meds. He is been on Plavix since 2001. Because he has had some bleeding over time, I feel it is safe to now stop his Plavix and continue his aspirin.  He's feeling well. He is not having any chest pain or significant shortness of breath. He does not feel his PVCs.  His daughter went to [facility] for 14 years, Dr. 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     Past Medical History  Diagnosis Date  . Dyslipidemia   . CAD (coronary artery disease)     PCI d iagonal 2001, residual 60% LAD /  Nuclear 2006, no ischemia  . Ejection fraction     EF normal,  . Carotid artery disease ([place])     Doppler, October, 2012, 0-39% bilateral stenoses.  . Psoriatic arthritis ([place])     2012  . Arthritis   . Umbilical hernia [id number]  . Preop cardiovascular exam     Patient needs cardiac clearance for hernia surgery May, 2013  . PVC's (premature ventricular contractions)     Frequent PVCs noted over the years., These always decrease with exercise. This is documented  . Eye abnormalities 08-23-2011     right ryr injection for bleed done by dr Jorge [name], dr Jorge [name] told pt ok to have surgery 09-03-2011  . Family history of anesthesia complication     SISTER HAD MEMORY PROBLEMS POST OP  . Diverticulitis     Past Surgical History  Procedure Laterality Date  . Carpel tunnel  2009/2010    both wrists  . Angioplasty  12/1999 and 03/2000  . Right knee meniscus repair  1990's  . Inguinal hernia repair  09/03/2011    Procedure: HERNIA REPAIR INGUINAL ADULT;  Surgeon: Jorge Burn. Georgette Dover, MD;  Location: [facility];  Service: General;  Laterality: Left;  Left Ingunal Hernia Repair with Mesh  . Umbilical hernia repair  09/03/2011    Procedure: HERNIA REPAIR UMBILICAL ADULT;  Surgeon: Jorge Burn. Georgette Dover, MD;  [facility];  Service: General;  Laterality: N/A;  . Cardiac catheterization  2001  . Esophagogastroduodenoscopy N/A 10/30/2013    Procedure: ESOPHAGOGASTRODUODENOSCOPY (EGD);  Surgeon: Jorge Beams, MD;  Location: [facility]COPY;  Service: Endoscopy;  Laterality: N/A;  . Colonoscopy N/A 10/30/2013    Procedure: COLONOSCOPY;  Surgeon: Jorge Beams, MD;  Location: [facility];  Service: Endoscopy;  Laterality: N/A;    Current Outpatient Prescriptions  Medication Sig Dispense Refill  . aspirin EC 81 MG tablet Take 81 mg by mouth daily.    Marland Kitchen  atorvastatin (LIPITOR) 40 MG tablet Take 40 mg by mouth daily with breakfast.     . ezetimibe (ZETIA) 10 MG tablet Take 10 mg by mouth daily with breakfast.    . fish oil-omega-3 fatty acids 1000 MG capsule Take 2 g by mouth daily with breakfast.     . metoprolol succinate (TOPROL-XL) 25 MG 24 hr tablet TAKE 1/2 TABLET BY MOUTH EVERY DAY WITH BREAKFAST 15 tablet 5  . Multiple Vitamin (MULTIVITAMIN) capsule Take 1 capsule by mouth daily.      . pantoprazole (PROTONIX) 40 MG tablet Take 1 tablet (40 mg total) by mouth daily. 30 tablet 1  . Tofacitinib Citrate (XELJANZ) 5 MG TABS Take 5 mg by mouth daily.     No current facility-administered medications for this  visit.    Allergies:   Niacin   Social History   Social History  . Marital Status: Married    Spouse Name: N/A  . Number of Children: N/A  . Years of Education: N/A   Social History Main Topics  . Smoking status: Former Smoker -- 1.50 packs/day for 25 years    Types: Cigarettes    Quit date: 12/08/1994  . Smokeless tobacco: Former Systems developer  . Alcohol Use: Yes     Comment: 2 drink per day  . Drug Use: No  . Sexual Activity: Not Asked   Other Topics Concern  . None   Social History Narrative   Does not use cane or walker, independent of IDLs     Family History:  The patient's family history includes Cancer in his mother and sister; Crohn's disease in his cousin. There is no history of Colon cancer or Ulcerative colitis.   ROS:   Please see the history of present illness.    Review of Systems  Constitution: Negative.  Cardiovascular: Negative.   Gastrointestinal: Negative.    All other systems reviewed and are negative.   PHYSICAL EXAM:   VS:  BP 118/70 mmHg  Pulse 62  Ht 5\' 9"  (1.753 m)  Wt 219 lb (99.338 kg)  BMI 32.33 kg/m2  SpO2 98%   GEN: Well nourished, well developed, in no acute distress HEENT: normal Neck: no JVD, carotid bruits, or masses Cardiac: RRR; no murmurs, rubs, or gallops,no edema  Respiratory:  clear to auscultation bilaterally, normal work of breathing GI: soft, nontender, nondistended, + BS MS: no deformity or atrophy Skin: warm and dry, no rash Neuro:  Alert and Oriented x 3, Strength and sensation are intact Psych: euthymic mood, full affect  Wt Readings from Last 3 Encounters:  03/24/15 219 lb (99.338 kg)  09/17/14 218 lb (98.884 kg)  01/24/14 210 lb 6 oz (95.425 kg)      Studies/Labs Reviewed:   EKG:  EKG is not ordered today.    Recent Labs: No results found for requested labs within last 365 days.   Lipid Panel No results found for: CHOL, TRIG, HDL, CHOLHDL, VLDL, LDLCALC, LDLDIRECT   LDL 77, HDL 38 - 2016  Additional  studies/ records that were reviewed today include:   Carotid Dopplers 2016 reassuring.    ASSESSMENT:    1. PVC's (premature ventricular contractions)   2. Carotid artery disease, unspecified laterality ([place])   3. Coronary artery disease due to lipid rich plaque   4. Dyslipidemia      PLAN:    overall PVCs of an asymptomatic. No changes made.   carotid artery stable   coronary disease diagonal stent. Residual LAD disease, no  ischemia. Doing well. Aggressive prevention. Continue with both Zetia as well as atorvastatin.   Medication Adjustments/Labs and Tests Ordered: Current medicines are reviewed at length with the patient today.  Concerns regarding medicines are outlined above.  Medication changes, Labs and Tests ordered today are listed below. Patient Instructions  Medication Instructions:  The current medical regimen is effective;  continue present plan and medications.  Follow-Up: Follow up in 1 year with Dr. Marlou Porch.  You will receive a letter in the mail 2 months before you are due.  Please call us when you receive this letter to schedule your follow up appointment.  If you need a refill on your cardiac medications before your next appointment, please call your pharmacy.  Thank you for choosing [facility]!!            Signed, Jorge Furbish, MD  03/24/2015 10:51 AM    [facility] [address], [place], [place]  [address] Phone: [phone]; Fax: [phone]

## 2015-03-24 NOTE — Patient Instructions (Signed)

## 2015-04-17 ENCOUNTER — Other Ambulatory Visit: Payer: Self-pay | Admitting: Cardiology

## 2015-04-21 DIAGNOSIS — H40013 Open angle with borderline findings, low risk, bilateral: Secondary | ICD-10-CM | POA: Diagnosis not present

## 2015-06-14 DIAGNOSIS — H34811 Central retinal vein occlusion, right eye, with macular edema: Secondary | ICD-10-CM | POA: Diagnosis not present

## 2015-06-14 DIAGNOSIS — H2511 Age-related nuclear cataract, right eye: Secondary | ICD-10-CM | POA: Diagnosis not present

## 2015-06-14 DIAGNOSIS — H3582 Retinal ischemia: Secondary | ICD-10-CM | POA: Diagnosis not present

## 2015-06-14 DIAGNOSIS — H2513 Age-related nuclear cataract, bilateral: Secondary | ICD-10-CM | POA: Diagnosis not present

## 2015-06-14 DIAGNOSIS — H35351 Cystoid macular degeneration, right eye: Secondary | ICD-10-CM | POA: Diagnosis not present

## 2015-08-12 DIAGNOSIS — H34811 Central retinal vein occlusion, right eye, with macular edema: Secondary | ICD-10-CM | POA: Diagnosis not present

## 2015-10-20 DIAGNOSIS — H34811 Central retinal vein occlusion, right eye, with macular edema: Secondary | ICD-10-CM | POA: Diagnosis not present

## 2015-11-16 DIAGNOSIS — H40013 Open angle with borderline findings, low risk, bilateral: Secondary | ICD-10-CM | POA: Diagnosis not present

## 2015-11-16 DIAGNOSIS — H25013 Cortical age-related cataract, bilateral: Secondary | ICD-10-CM | POA: Diagnosis not present

## 2015-11-16 DIAGNOSIS — H2513 Age-related nuclear cataract, bilateral: Secondary | ICD-10-CM | POA: Diagnosis not present

## 2015-11-16 DIAGNOSIS — H348112 Central retinal vein occlusion, right eye, stable: Secondary | ICD-10-CM | POA: Diagnosis not present

## 2015-12-12 DIAGNOSIS — I10 Essential (primary) hypertension: Secondary | ICD-10-CM | POA: Diagnosis not present

## 2015-12-12 DIAGNOSIS — Z0000 Encounter for general adult medical examination without abnormal findings: Secondary | ICD-10-CM | POA: Diagnosis not present

## 2015-12-12 DIAGNOSIS — Z23 Encounter for immunization: Secondary | ICD-10-CM | POA: Diagnosis not present

## 2015-12-12 DIAGNOSIS — M1990 Unspecified osteoarthritis, unspecified site: Secondary | ICD-10-CM | POA: Diagnosis not present

## 2015-12-12 DIAGNOSIS — Z125 Encounter for screening for malignant neoplasm of prostate: Secondary | ICD-10-CM | POA: Diagnosis not present

## 2015-12-26 ENCOUNTER — Encounter: Payer: Self-pay | Admitting: Cardiology

## 2015-12-26 DIAGNOSIS — B0229 Other postherpetic nervous system involvement: Secondary | ICD-10-CM | POA: Diagnosis not present

## 2015-12-26 DIAGNOSIS — M1990 Unspecified osteoarthritis, unspecified site: Secondary | ICD-10-CM | POA: Diagnosis not present

## 2015-12-26 DIAGNOSIS — Z0000 Encounter for general adult medical examination without abnormal findings: Secondary | ICD-10-CM | POA: Diagnosis not present

## 2015-12-26 DIAGNOSIS — I10 Essential (primary) hypertension: Secondary | ICD-10-CM | POA: Diagnosis not present

## 2015-12-26 DIAGNOSIS — Z1212 Encounter for screening for malignant neoplasm of rectum: Secondary | ICD-10-CM | POA: Diagnosis not present

## 2015-12-28 ENCOUNTER — Other Ambulatory Visit: Payer: Self-pay | Admitting: Cardiology

## 2016-01-12 DIAGNOSIS — H34811 Central retinal vein occlusion, right eye, with macular edema: Secondary | ICD-10-CM | POA: Diagnosis not present

## 2016-04-18 DIAGNOSIS — H35351 Cystoid macular degeneration, right eye: Secondary | ICD-10-CM | POA: Diagnosis not present

## 2016-04-18 DIAGNOSIS — H2511 Age-related nuclear cataract, right eye: Secondary | ICD-10-CM | POA: Diagnosis not present

## 2016-04-18 DIAGNOSIS — H34811 Central retinal vein occlusion, right eye, with macular edema: Secondary | ICD-10-CM | POA: Diagnosis not present

## 2016-04-18 DIAGNOSIS — H2513 Age-related nuclear cataract, bilateral: Secondary | ICD-10-CM | POA: Diagnosis not present

## 2016-04-26 ENCOUNTER — Encounter: Payer: Self-pay | Admitting: Cardiology

## 2016-04-30 ENCOUNTER — Encounter (INDEPENDENT_AMBULATORY_CARE_PROVIDER_SITE_OTHER): Payer: Self-pay

## 2016-04-30 ENCOUNTER — Encounter: Payer: Self-pay | Admitting: Cardiology

## 2016-04-30 ENCOUNTER — Ambulatory Visit (INDEPENDENT_AMBULATORY_CARE_PROVIDER_SITE_OTHER): Payer: Medicare Other | Admitting: Cardiology

## 2016-04-30 VITALS — BP 124/74 | HR 98 | Ht 69.0 in | Wt 210.4 lb

## 2016-04-30 DIAGNOSIS — E785 Hyperlipidemia, unspecified: Secondary | ICD-10-CM

## 2016-04-30 DIAGNOSIS — I2510 Atherosclerotic heart disease of native coronary artery without angina pectoris: Secondary | ICD-10-CM | POA: Diagnosis not present

## 2016-04-30 DIAGNOSIS — I493 Ventricular premature depolarization: Secondary | ICD-10-CM

## 2016-04-30 NOTE — Progress Notes (Signed)
Cardiology Office Note    Date:  04/30/2016   ID:  Ancel, Shone 09-27-1945, MRN [id number]  PCP:  Horatio Pel, MD  Cardiologist:   Candee Furbish, MD (Former Ron Parker)    History of Present Illness:  Jorge [name] is a 71 y.o. male who presents today to follow-up coronary disease (ptca ONLY DIAG, ROTABLTOR X 2) and PVCs. 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 This has been assessed carefully and repeatedly over time. Nuclear study in 2013 revealed no ischemia. Stress echo in 2014 revealed no ischemia. EF at that time was 65%. Overall his PVCs remained stable at stress. He does not have syncope or presyncope.  The patient was evaluated for GI bleeding in August, 2015. It is my understanding that this was a diverticular bleed. He also had some antral gastritis. There was no need for transfusion. He was cleared to use aspirin and Plavix as needed. He was encouraged to not use non-steroidal anti-inflammatory meds. He is been on Plavix since 2001. Because he has had some bleeding over time, I feel it is safe to now stop his Plavix and continue his aspirin.  He's feeling well. He is not having any chest pain or significant shortness of breath. He does not feel his PVCs.  His daughter went to [employer] for 14 years, Dr. 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 Arthritis   . CAD (coronary artery disease)    PTCA diagonal 2001, residual 60% LAD /  Nuclear 2006, no ischemia  . Carotid artery disease ([place])    Doppler, October, 2012, 0-39% bilateral stenoses.  . Diverticulitis   . Dyslipidemia   . Ejection fraction    EF normal,  . Eye abnormalities 08-23-2011   right ryr injection for bleed done by dr Zadie Rhine, dr Zadie Rhine told pt ok to have surgery 09-03-2011  . Family history of anesthesia complication    SISTER HAD MEMORY PROBLEMS POST OP  . Preop cardiovascular exam    Patient needs cardiac  clearance for hernia surgery May, 2013  . Psoriatic arthritis ([place])    2012  . PVC's (premature ventricular contractions)    Frequent PVCs noted over the years., These always decrease with exercise. This is documented  . Umbilical hernia [id number]    Past Surgical History:  Procedure Laterality Date  . ANGIOPLASTY  12/1999 and 03/2000  . CARDIAC CATHETERIZATION  2001  . carpel tunnel  2009/2010   both wrists  . COLONOSCOPY N/A 10/30/2013   Procedure: COLONOSCOPY;  Surgeon: Beryle Beams, MD;  Location: [facility];  Service: Endoscopy;  Laterality: N/A;  . ESOPHAGOGASTRODUODENOSCOPY N/A 10/30/2013   Procedure: ESOPHAGOGASTRODUODENOSCOPY (EGD);  Surgeon: Beryle Beams, MD;  Location: [facility];  Service: Endoscopy;  Laterality: N/A;  . INGUINAL HERNIA REPAIR  09/03/2011   Procedure: HERNIA REPAIR INGUINAL ADULT;  Surgeon: Imogene Burn. Tsuei, MD;  [facility];  Service: General;  Laterality: Left;  Left Ingunal Hernia Repair with Mesh  . right knee meniscus repair  1990's  . UMBILICAL HERNIA REPAIR  09/03/2011   Procedure: HERNIA REPAIR UMBILICAL ADULT;  Surgeon: Imogene Burn. Georgette Dover, MD;  [facility];  Service: General;  Laterality: N/A;    Current Outpatient Prescriptions  Medication Sig Dispense Refill  . aspirin EC 81 MG tablet Take 81 mg by mouth daily.    Marland Kitchen atorvastatin (LIPITOR) 80 MG tablet  Take 40 mg by mouth daily.    Marland Kitchen ezetimibe (ZETIA) 10 MG tablet Take 10 mg by mouth daily with breakfast.    . fish oil-omega-3 fatty acids 1000 MG capsule Take 2 g by mouth daily with breakfast.     . metoprolol succinate (TOPROL-XL) 25 MG 24 hr tablet TAKE 1/2 TABLET BY MOUTH EVERY DAY WITH BREAKFAST 45 tablet 3  . Multiple Vitamin (MULTIVITAMIN) capsule Take 1 capsule by mouth daily.      . pantoprazole (PROTONIX) 40 MG tablet Take 1 tablet (40 mg total) by mouth daily. 30 tablet 1  . Tofacitinib Citrate (XELJANZ) 5 MG TABS Take 5 mg by mouth daily.     No current  facility-administered medications for this visit.     Allergies:   Niacin   Social History   Social History  . Marital status: Married    Spouse name: N/A  . Number of children: N/A  . Years of education: N/A   Social History Main Topics  . Smoking status: Former Smoker    Packs/day: 1.50    Years: 25.00    Types: Cigarettes    Quit date: 12/08/1994  . Smokeless tobacco: Former Systems developer  . Alcohol use Yes     Comment: 2 drink per day  . Drug use: No  . Sexual activity: Not Asked   Other Topics Concern  . None   Social History Narrative   Does not use cane or walker, independent of IDLs     Family History:  The patient's family history includes Cancer in his mother and sister; Crohn's disease in his cousin.   ROS:   Please see the history of present illness.    Review of Systems  Constitution: Negative.  Cardiovascular: Negative.   Gastrointestinal: Negative.    All other systems reviewed and are negative.   PHYSICAL EXAM:   VS:  BP 124/74   Pulse 98   Ht 5\' 9"  (1.753 m)   Wt 210 lb 6.4 oz (95.4 kg)   SpO2 99%   BMI 31.07 kg/m    GEN: Well nourished, well developed, in no acute distress HEENT: normal Neck: no JVD, carotid bruits, or masses Cardiac: RRR; no murmurs, rubs, or gallops,no edema  Respiratory:  clear to auscultation bilaterally, normal work of breathing GI: soft, nontender, nondistended, + BS MS: no deformity or atrophy Skin: warm and dry, no rash Neuro:  Alert and Oriented x 3, Strength and sensation are intact Psych: euthymic mood, full affect  Wt Readings from Last 3 Encounters:  04/30/16 210 lb 6.4 oz (95.4 kg)  03/24/15 219 lb (99.3 kg)  09/17/14 218 lb (98.9 kg)      Studies/Labs Reviewed:   EKG:  EKG is not ordered today.    Recent Labs: No results found for requested labs within last 8760 hours.   Lipid Panel No results found for: CHOL, TRIG, HDL, CHOLHDL, VLDL, LDLCALC, LDLDIRECT   LDL 77, HDL 38 - 2016  Additional  studies/ records that were reviewed today include:   Carotid Dopplers 2016 reassuring.  ECHO 04/30/16 - Procedure narrative: Transthoracic echocardiography for left   ventricular function evaluation. Image quality was suboptimal.   The study was technically difficult. - Left ventricle: The cavity size was normal. Wall thickness was   increased in a pattern of mild LVH. Systolic function was normal.   The estimated ejection fraction was in the range of 55% to 60%.   Wall motion was normal; there were no regional  wall motion   abnormalities. - Aortic valve: There was mild regurgitation. - Pulmonary arteries: Systolic pressure was mildly increased. PA   peak pressure: 31 mm Hg (S).   ASSESSMENT:    1. PVC's (premature ventricular contractions)   2. Dyslipidemia   3. Coronary artery disease involving native coronary artery of native heart without angina pectoris      PLAN:     Coronary artery disease  - 2001 remote history of small diagonal branch PTCA only with Rotablator 2. He has been asymptomatic. Doing very well. Secondary prevention.  - Echocardiogram looked excellent.  PVCs  - Extensive workups in the past. Low-dose metoprolol. Doing very well. No high-risk symptoms such as syncope. No indication for pacemaker.  Hyperlipidemia  - Fish oil, atorvastatin, Zetia. Continue. Dr. Shelia Media has been monitoring.  Uncomfortable seeing he and his wife now on an as-needed basis.  Medication Adjustments/Labs and Tests Ordered: Current medicines are reviewed at length with the patient today.  Concerns regarding medicines are outlined above.  Medication changes, Labs and Tests ordered today are listed below. Patient Instructions  Medication Instructions:  The current medical regimen is effective;  continue present plan and medications.  Follow-Up: Follow up as needed with Dr Marlou Porch.  Thank you for choosing [facility]!!         Signed, Candee Furbish, MD  04/30/2016  9:29 AM    [address], [place], [place]  [address] Phone: [phone]; Fax: [phone]

## 2016-04-30 NOTE — Patient Instructions (Signed)
Medication Instructions:  The current medical regimen is effective;  continue present plan and medications.  Follow-Up: Follow up as needed with Dr Skains.  Thank you for choosing  [facility]!!     

## 2016-05-10 DIAGNOSIS — I499 Cardiac arrhythmia, unspecified: Secondary | ICD-10-CM | POA: Diagnosis not present

## 2016-05-10 DIAGNOSIS — H8110 Benign paroxysmal vertigo, unspecified ear: Secondary | ICD-10-CM | POA: Diagnosis not present

## 2016-05-11 DIAGNOSIS — H8110 Benign paroxysmal vertigo, unspecified ear: Secondary | ICD-10-CM | POA: Diagnosis not present

## 2016-05-11 DIAGNOSIS — R110 Nausea: Secondary | ICD-10-CM | POA: Diagnosis not present

## 2016-05-11 DIAGNOSIS — R42 Dizziness and giddiness: Secondary | ICD-10-CM | POA: Diagnosis not present

## 2016-05-11 DIAGNOSIS — R2681 Unsteadiness on feet: Secondary | ICD-10-CM | POA: Diagnosis not present

## 2016-05-21 DIAGNOSIS — H04123 Dry eye syndrome of bilateral lacrimal glands: Secondary | ICD-10-CM | POA: Diagnosis not present

## 2016-05-21 DIAGNOSIS — H40013 Open angle with borderline findings, low risk, bilateral: Secondary | ICD-10-CM | POA: Diagnosis not present

## 2016-06-06 DIAGNOSIS — H3561 Retinal hemorrhage, right eye: Secondary | ICD-10-CM | POA: Diagnosis not present

## 2016-06-06 DIAGNOSIS — H3582 Retinal ischemia: Secondary | ICD-10-CM | POA: Diagnosis not present

## 2016-06-06 DIAGNOSIS — H35351 Cystoid macular degeneration, right eye: Secondary | ICD-10-CM | POA: Diagnosis not present

## 2016-06-06 DIAGNOSIS — H34811 Central retinal vein occlusion, right eye, with macular edema: Secondary | ICD-10-CM | POA: Diagnosis not present

## 2016-06-26 NOTE — Progress Notes (Signed)
CARDIOLOGY OFFICE NOTE  Date:  06/27/2016    Jorge [name] Date of Birth: 1946/03/01 Medical Record #[id number]  PCP:  Horatio Pel, MD  Cardiologist:  [facility]  Chief Complaint  Patient presents with  . Irregular Heart Beat    Work in visit - seen for Dr. Marlou Porch    History of Present Illness: Jorge [name] is a 71 y.o. male who presents today for a work in visit due to needing DOT paperwork and concern regarding HR/PVCs. Seen for Dr. Marlou Porch.   He has known coronary disease (ptca ONLY DIAG, ROTABLTOR X 2) and PVCs. 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 Noted by Dr. Marlou Porch that this has been assessed carefully and repeatedly over time. Nuclear study in 2013 revealed no ischemia. Stress echo in 2014 revealed no ischemia. EF at that time was 65%. He does not have syncope or presyncope.   The patient was evaluated for GI bleeding in August, 2015. Felt to be a diverticular bleed. He also had some antral gastritis. There was no need for transfusion. He was cleared to use aspirin and Plavix as needed. He was encouraged to not use non-steroidal anti-inflammatory meds. He has been on Plavix since 2001. Because he has had some bleeding over time, Dr. Marlou Porch felt it was safe to now stop his Plavix and continue his aspirin.   He was just seen here back in February by Dr. Marlou Porch - felt to be doing ok. Was to come back as needed.   Comes in today. Here alone. Very difficult to follow. Says he is feeling ok. No chest pain. Not short of breath. No syncope. Not dizzy or lightheaded. Has "misplaced" his metoprolol for the past couple of weeks - does not really quantify. 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 Tells me he has never had a heart monitor. Unclear why he could not get his metoprolol refilled.   Past Medical History:  Diagnosis Date  . Arthritis   . CAD (coronary artery disease)    PTCA diagonal 2001, residual 60% LAD /   Nuclear 2006, no ischemia  . Carotid artery disease ([employer])    Doppler, October, 2012, 0-39% bilateral stenoses.  . Diverticulitis   . Dyslipidemia   . Ejection fraction    EF normal,  . Eye abnormalities 08-23-2011   right ryr injection for bleed done by dr Zadie Rhine, dr Zadie Rhine told pt ok to have surgery 09-03-2011  . Family history of anesthesia complication    SISTER HAD MEMORY PROBLEMS POST OP  . Preop cardiovascular exam    Patient needs cardiac clearance for hernia surgery May, 2013  . Psoriatic arthritis ([place])    2012  . PVC's (premature ventricular contractions)    Frequent PVCs noted over the years., These always decrease with exercise. This is documented  . Umbilical hernia 3/78/5885    Past Surgical History:  Procedure Laterality Date  . ANGIOPLASTY  12/1999 and 03/2000  . CARDIAC CATHETERIZATION  2001  . carpel tunnel  2009/2010   both wrists  . COLONOSCOPY N/A 10/30/2013   Procedure: COLONOSCOPY;  Surgeon: Beryle Beams, MD;  Location: [facility];  Service: Endoscopy;  Laterality: N/A;  . ESOPHAGOGASTRODUODENOSCOPY N/A 10/30/2013   Procedure: ESOPHAGOGASTRODUODENOSCOPY (EGD);  Surgeon: Beryle Beams, MD;  Location: [facility] ENDOSCOPY;  Service: Endoscopy;  Laterality: N/A;  . INGUINAL HERNIA REPAIR  09/03/2011   Procedure: HERNIA REPAIR INGUINAL ADULT;  Surgeon: Rodman Key  Oren Section, MD;  [facility];  Service: General;  Laterality: Left;  Left Ingunal Hernia Repair with Mesh  . right knee meniscus repair  1990's  . UMBILICAL HERNIA REPAIR  09/03/2011   Procedure: HERNIA REPAIR UMBILICAL ADULT;  Surgeon: Imogene Burn. Georgette Dover, MD;  [facility];  Service: General;  Laterality: N/A;     Medications: Current Outpatient Prescriptions  Medication Sig Dispense Refill  . aspirin EC 81 MG tablet Take 81 mg by mouth daily.    Marland Kitchen atorvastatin (LIPITOR) 80 MG tablet Take 40 mg by mouth daily.    Marland Kitchen ezetimibe (ZETIA) 10 MG tablet Take 10 mg by mouth daily with breakfast.    . fish  oil-omega-3 fatty acids 1000 MG capsule Take 2 g by mouth daily with breakfast.     . metoprolol succinate (TOPROL-XL) 25 MG 24 hr tablet TAKE 1/2 TABLET BY MOUTH EVERY DAY WITH BREAKFAST 45 tablet 3  . Multiple Vitamin (MULTIVITAMIN) capsule Take 1 capsule by mouth daily.      . pantoprazole (PROTONIX) 40 MG tablet Take 1 tablet (40 mg total) by mouth daily. 30 tablet 1  . Tofacitinib Citrate (XELJANZ) 5 MG TABS Take 5 mg by mouth daily.     No current facility-administered medications for this visit.     Allergies: Allergies  Allergen Reactions  . Niacin Itching    And swelling    Social History: The patient  reports that he quit smoking about 21 years ago. His smoking use included Cigarettes. He has a 37.50 pack-year smoking history. He has quit using smokeless tobacco. He reports that he drinks alcohol. He reports that he does not use drugs.   Family History: The patient's family history includes Cancer in his mother and sister; Crohn's disease in his cousin.   Review of Systems: Please see the history of present illness.   Otherwise, the review of systems is positive for none.   All other systems are reviewed and negative.   Physical Exam: VS:  BP (!) 150/80   Pulse 88   Ht 5\' 10"  (1.778 m)   Wt 223 lb 1.9 oz (101.2 kg)   BMI 32.01 kg/m  .  BMI Body mass index is 32.01 kg/m.  Wt Readings from Last 3 Encounters:  06/27/16 223 lb 1.9 oz (101.2 kg)  04/30/16 210 lb 6.4 oz (95.4 kg)  03/24/15 219 lb (99.3 kg)   BP is 160/70 by me  General: Alert. He smells highly of fried foods. He is alert and in no acute distress.   HEENT: Normal.  Neck: Supple, no JVD, carotid bruits, or masses noted.  Cardiac: Regular rate and rhythm. Very frequent ectopics. No edema.  Respiratory:  Lungs are clear to auscultation bilaterally with normal work of breathing.  GI: Soft and nontender.  MS: No deformity or atrophy. Gait and ROM intact.  Skin: Warm and dry. Color is normal.  Neuro:   Strength and sensation are intact and no gross focal deficits noted.  Psych: Alert, appropriate and with normal affect.   LABORATORY DATA:  EKG:  EKG is ordered today. This demonstrates NSR with frequent PVCs, couplets.  Lab Results  Component Value Date   WBC 4.1 10/30/2013   HGB 11.3 (L) 10/30/2013   HCT 32.0 (L) 10/30/2013   PLT 133 (L) 10/30/2013   GLUCOSE 89 10/30/2013   ALT 29 10/28/2013   AST 21 10/28/2013   NA 144 10/30/2013   K 4.2 10/30/2013   CL 107 10/30/2013  CREATININE 0.97 10/30/2013   BUN 12 10/30/2013   CO2 26 10/30/2013   INR 1.05 10/28/2013   HGBA1C 5.7 (H) 10/29/2013    BNP (last 3 results) No results for input(s): BNP in the last 8760 hours.  ProBNP (last 3 results) No results for input(s): PROBNP in the last 8760 hours.   Other Studies Reviewed Today:  ECHO 09/2014 - Procedure narrative: Transthoracic echocardiography for left ventricular function evaluation. Image quality was suboptimal. The study was technically difficult. - Left ventricle: The cavity size was normal. Wall thickness was increased in a pattern of mild LVH. Systolic function was normal. The estimated ejection fraction was in the range of 55% to 60%. Wall motion was normal; there were no regional wall motion abnormalities. - Aortic valve: There was mild regurgitation. - Pulmonary arteries: Systolic pressure was mildly increased. PA peak pressure: 31 mm Hg (S).    Assessment/Plan:   1. Coronary artery disease - 2001 remote history of small diagonal branch PTCA only with Rotablator 2.   2. 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 Metoprolol refilled today.   3. Hyperlipidemia - on multiple agents - monitored by PCP.   4. Need for CDL license -  apparently sent here from [facility] [address] - he tells me he was seen there. Does not know the provider than he saw - I have called - I was told by "Janett Billow" that he left without being seen. The patient denies this.   Current medicines are reviewed with the patient today.  The patient does not have concerns regarding medicines other than what has been noted above.  The following changes have been made:  See above.  Labs/ tests ordered today include:    Orders Placed This Encounter  Procedures  . Holter monitor - 48 hour  . EKG 12-Lead     Disposition:   FU with Dr. Marlou Porch as planned.    Patient is agreeable to this plan and will call if any problems develop in the interim.   SignedTruitt Merle, NP  06/27/2016 3:04 PM  [facility] [address] [place], [place]  [address] Phone: [phone] Fax: [phone]

## 2016-06-27 ENCOUNTER — Encounter: Payer: Self-pay | Admitting: Nurse Practitioner

## 2016-06-27 ENCOUNTER — Ambulatory Visit (INDEPENDENT_AMBULATORY_CARE_PROVIDER_SITE_OTHER): Payer: Medicare Other | Admitting: Nurse Practitioner

## 2016-06-27 ENCOUNTER — Telehealth: Payer: Self-pay | Admitting: *Deleted

## 2016-06-27 VITALS — BP 150/80 | HR 88 | Ht 70.0 in | Wt 223.1 lb

## 2016-06-27 DIAGNOSIS — I493 Ventricular premature depolarization: Secondary | ICD-10-CM

## 2016-06-27 DIAGNOSIS — I2510 Atherosclerotic heart disease of native coronary artery without angina pectoris: Secondary | ICD-10-CM | POA: Diagnosis not present

## 2016-06-27 MED ORDER — METOPROLOL SUCCINATE ER 25 MG PO TB24: ORAL_TABLET | ORAL | 3 refills | Status: DC

## 2016-06-27 NOTE — Telephone Encounter (Signed)
s/w Kristeen Miss, Faxing to Dr. Gaye Alken pt's ov note

## 2016-06-27 NOTE — Patient Instructions (Addendum)
We will be checking the following labs today - NONE  Medication Instructions:    Continue with your current medicines.   I would like to get you back on Metoprolol - this has been sent back to your drug store. You may have to pay out of pocket for the first prescription or for enough pills until April 30th.     Testing/Procedures To Be Arranged:  48 hour Holter  Follow-Up:   Will see how the Monitor turns out     Other Special Instructions:   N/A    If you need a refill on your cardiac medications before your next appointment, please call your pharmacy.   Call the [place] office at [phone] if you have any questions, problems or concerns.

## 2016-07-04 ENCOUNTER — Ambulatory Visit (INDEPENDENT_AMBULATORY_CARE_PROVIDER_SITE_OTHER): Payer: Medicare Other

## 2016-07-04 DIAGNOSIS — I493 Ventricular premature depolarization: Secondary | ICD-10-CM | POA: Diagnosis not present

## 2016-07-20 ENCOUNTER — Telehealth: Payer: Self-pay | Admitting: Cardiology

## 2016-07-20 DIAGNOSIS — I493 Ventricular premature depolarization: Secondary | ICD-10-CM

## 2016-07-20 NOTE — Telephone Encounter (Signed)
I am referring to electrophysiology, checking echocardiogram. Unable to complete driver's license. Extremely frequent PVCs. Candee Furbish, MD

## 2016-07-20 NOTE — Telephone Encounter (Signed)
-----   Message from Jerline Pain, MD sent at 07/20/2016 10:23 AM EDT -----      Extremely frequent PVCs, trigeminy, couplets, ventricular runs (approximately 50% of total QRS complexes per monitor)   We will refer to EP, will check ECHO as well (last 2016). 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 Question further medical therapy or ablation to help ward off cardiomyopathy? Candee Furbish, MD

## 2016-07-20 NOTE — Telephone Encounter (Signed)
Spoke to patient. Informed result are not available.   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  Aware will forward/defer to Dr Marlou Porch.

## 2016-07-20 NOTE — Telephone Encounter (Signed)
Follow Up:    Pt would like his monitor results please.

## 2016-07-20 NOTE — Telephone Encounter (Signed)
Left message to call back  

## 2016-07-23 NOTE — Telephone Encounter (Signed)
Left message to call back  

## 2016-07-24 NOTE — Telephone Encounter (Signed)
Follow up      Returning a call to get monitor results

## 2016-07-25 NOTE — Telephone Encounter (Signed)
Pt aware of monitor results and that he needs further evaluation with echo and EP.  He is aware someone will call him with the appts.

## 2016-07-31 ENCOUNTER — Telehealth (HOSPITAL_COMMUNITY): Payer: Self-pay | Admitting: Cardiology

## 2016-08-06 NOTE — Telephone Encounter (Signed)
  07/31/2016 09:49 AM Phone (Outgoing) Dashon, Mcintire (Self) [phone] (M)   Left Message - Called pt and lmsg for him to CB to get scheduled for an echo.     By Cherie Dark A    08/02/2016 09:25 AM Phone ([address]) Ronie, Barnhart (Self) [phone] Jerilynn Mages)   Left Message - Called pt and lmsg for him to CB to get scheduled for echo.     By Verdene Rio

## 2016-08-15 ENCOUNTER — Institutional Professional Consult (permissible substitution): Payer: Medicare Other | Admitting: Internal Medicine

## 2016-08-16 ENCOUNTER — Other Ambulatory Visit: Payer: Self-pay

## 2016-08-16 ENCOUNTER — Ambulatory Visit (HOSPITAL_COMMUNITY): Payer: Medicare Other | Attending: Internal Medicine

## 2016-08-16 DIAGNOSIS — I493 Ventricular premature depolarization: Secondary | ICD-10-CM | POA: Diagnosis not present

## 2016-08-16 DIAGNOSIS — I5030 Unspecified diastolic (congestive) heart failure: Secondary | ICD-10-CM | POA: Diagnosis not present

## 2016-08-16 DIAGNOSIS — I429 Cardiomyopathy, unspecified: Secondary | ICD-10-CM | POA: Diagnosis not present

## 2016-08-20 ENCOUNTER — Ambulatory Visit (INDEPENDENT_AMBULATORY_CARE_PROVIDER_SITE_OTHER): Payer: Medicare Other | Admitting: Internal Medicine

## 2016-08-20 ENCOUNTER — Encounter: Payer: Self-pay | Admitting: Internal Medicine

## 2016-08-20 VITALS — BP 90/60 | HR 84 | Ht 69.0 in | Wt 224.0 lb

## 2016-08-20 DIAGNOSIS — I493 Ventricular premature depolarization: Secondary | ICD-10-CM | POA: Diagnosis not present

## 2016-08-20 DIAGNOSIS — I2510 Atherosclerotic heart disease of native coronary artery without angina pectoris: Secondary | ICD-10-CM | POA: Diagnosis not present

## 2016-08-20 NOTE — Patient Instructions (Addendum)
Medication Instructions:  Your physician recommends that you continue on your current medications as directed. Please refer to the Current Medication list given to you today.   Labwork: None Ordered   Testing/Procedures: Your physician has requested that you have an exercise tolerance test.     Follow-Up: Follow-up to be determined after results of stress test     Any Other Special Instructions Will Be Listed Below (If Applicable).     If you need a refill on your cardiac medications before your next appointment, please call your pharmacy.

## 2016-08-20 NOTE — Progress Notes (Signed)
HPI [name] is referred today by Dr. Marlou Porch for evaluation of frequent PVC's. He has known CAD but has had no ischemic symptoms and has had a recent echo demonstrating preserved LV function. The patient has not had syncope and he does not feel palpitations. He has worn a 48 hour holter which demonstrated almost 50% of his heart beats as PVC's. He is able to walk on flat ground with out a problem but with an incline he will get sob. He denies edema and has no angina.  Allergies  Allergen Reactions  . Niacin Itching    And swelling     Current Outpatient Prescriptions  Medication Sig Dispense Refill  . aspirin EC 81 MG tablet Take 81 mg by mouth daily.    Marland Kitchen atorvastatin (LIPITOR) 80 MG tablet Take 40 mg by mouth daily.    Marland Kitchen ezetimibe (ZETIA) 10 MG tablet Take 10 mg by mouth daily with breakfast.    . fish oil-omega-3 fatty acids 1000 MG capsule Take 2 g by mouth daily with breakfast.     . metoprolol succinate (TOPROL-XL) 25 MG 24 hr tablet TAKE 1/2 TABLET BY MOUTH EVERY DAY WITH BREAKFAST 45 tablet 3  . Multiple Vitamin (MULTIVITAMIN) capsule Take 1 capsule by mouth daily.      . pantoprazole (PROTONIX) 40 MG tablet Take 1 tablet (40 mg total) by mouth daily. 30 tablet 1  . Tofacitinib Citrate (XELJANZ) 5 MG TABS Take 5 mg by mouth daily.     No current facility-administered medications for this visit.      Past Medical History:  Diagnosis Date  . Arthritis   . CAD (coronary artery disease)    PTCA diagonal 2001, residual 60% LAD /  Nuclear 2006, no ischemia  . Carotid artery disease ([place])    Doppler, October, 2012, 0-39% bilateral stenoses.  . Diverticulitis   . Dyslipidemia   . Ejection fraction    EF normal,  . Eye abnormalities 08-23-2011   right ryr injection for bleed done by dr Zadie Rhine, dr Zadie Rhine told pt ok to have surgery 09-03-2011  . Family history of anesthesia complication    SISTER HAD MEMORY PROBLEMS POST OP  . Preop cardiovascular exam    Patient needs  cardiac clearance for hernia surgery May, 2013  . Psoriatic arthritis ([place])    2012  . PVC's (premature ventricular contractions)    Frequent PVCs noted over the years., These always decrease with exercise. This is documented  . Umbilical hernia 08/18/930    ROS:   All systems reviewed and negative except as noted in the HPI.   Past Surgical History:  Procedure Laterality Date  . ANGIOPLASTY  12/1999 and 03/2000  . CARDIAC CATHETERIZATION  2001  . carpel tunnel  2009/2010   both wrists  . COLONOSCOPY N/A 10/30/2013   Procedure: COLONOSCOPY;  Surgeon: Beryle Beams, MD;  Location: [facility];  Service: Endoscopy;  Laterality: N/A;  . ESOPHAGOGASTRODUODENOSCOPY N/A 10/30/2013   Procedure: ESOPHAGOGASTRODUODENOSCOPY (EGD);  Surgeon: Beryle Beams, MD;  Location: [facility] ENDOSCOPY;  Service: Endoscopy;  Laterality: N/A;  . INGUINAL HERNIA REPAIR  09/03/2011   Procedure: HERNIA REPAIR INGUINAL ADULT;  Surgeon: Imogene Burn. Tsuei, MD;  [facility];  Service: General;  Laterality: Left;  Left Ingunal Hernia Repair with Mesh  . right knee meniscus repair  1990's  . UMBILICAL HERNIA REPAIR  09/03/2011   Procedure: HERNIA REPAIR UMBILICAL ADULT;  Surgeon: Imogene Burn. Georgette Dover, MD;  Location:  [facility];  Service: General;  Laterality: N/A;     Family History  Problem Relation Age of Onset  . Cancer Mother        uterine  . Cancer Sister        Breast  . Crohn's disease Cousin        distant  . Colon cancer Neg Hx   . Ulcerative colitis Neg Hx      Social History   Social History  . Marital status: Married    Spouse name: N/A  . Number of children: N/A  . Years of education: N/A   Occupational History  . Not on file.   Social History Main Topics  . Smoking status: Former Smoker    Packs/day: 1.50    Years: 25.00    Types: Cigarettes    Quit date: 12/08/1994  . Smokeless tobacco: Former Systems developer  . Alcohol use Yes     Comment: 2 drink per day  . Drug use: No  . Sexual activity:  Not on file   Other Topics Concern  . Not on file   Social History Narrative   Does not use cane or walker, independent of IDLs     BP 90/60   Pulse 84   Ht 5\' 9"  (1.753 m)   Wt 224 lb (101.6 kg)   SpO2 96%   BMI 33.08 kg/m   Physical Exam:  Well appearing 71 yo man, NAD HEENT: Unremarkable Neck:  6 cm JVD, no thyromegally Lymphatics:  No adenopathy Back:  No CVA tenderness Lungs:  Clear with no wheezes HEART:  Regular rate rhythm, no murmurs, no rubs, no clicks Abd:  soft, positive bowel sounds, no organomegally, no rebound, no guarding Ext:  2 plus pulses, no edema, no cyanosis, no clubbing Skin:  No rashes no nodules Neuro:  CN II through XII intact, motor grossly intact  EKG - NSR with PVC's in a LBBB pattern with the transition from negative to positive at V3. Superior axis.    Assess/Plan: 1. 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 I have discussed the treatment options with the patient and he would like to undergo watchful waing 2. CAD - with regard to medical therapy to suppress his PVC's, not much in the way of meds except for amiodarone and sotalol. Class 1A drugs are not an option in light of his prior CAD. 3. HTN - His blood pressure is well controlled. Will follow. 4. Dyslipidemia - he will continue his medical therapy with statin drugs.  Mikle Bosworth.D.

## 2016-08-28 ENCOUNTER — Ambulatory Visit (INDEPENDENT_AMBULATORY_CARE_PROVIDER_SITE_OTHER): Payer: Medicare Other

## 2016-08-28 DIAGNOSIS — I2510 Atherosclerotic heart disease of native coronary artery without angina pectoris: Secondary | ICD-10-CM | POA: Diagnosis not present

## 2016-08-28 DIAGNOSIS — I493 Ventricular premature depolarization: Secondary | ICD-10-CM

## 2016-08-28 LAB — EXERCISE TOLERANCE TEST
CSEPEDS: 0 s
Exercise duration (min): 7 min
Rest HR: 70 {beats}/min

## 2016-09-11 ENCOUNTER — Encounter: Payer: Self-pay | Admitting: *Deleted

## 2016-09-12 ENCOUNTER — Telehealth: Payer: Self-pay | Admitting: Internal Medicine

## 2016-09-12 NOTE — Telephone Encounter (Signed)
Jorge [name] return Jorge [name] nurse a call on exercise tolerance test. Jorge [name] was aware that Jorge [name] has not reviewed the results nor make recommendations. Jorge [name] states that Jorge had left him a message  Yesterday. I was not able to find a note regarding this call.  I read on the GXT study  highlights where Jorge [name] suggest a 48 hours Holter and EP referral.  Jorge [name] is aware that I let Jorge know to return the call.

## 2016-09-12 NOTE — Telephone Encounter (Signed)
New Message     Pt returning call for results on Exercise tolerance test

## 2016-09-24 NOTE — Telephone Encounter (Signed)
Dr. Lovena Le reviewed most recent stress test. Pt needs appt in early August 2018.   Called, pt unavailable. Requested pt to call r/t recent test.

## 2016-09-24 NOTE — Telephone Encounter (Signed)
Patient returning call, did not see opening for Dr. Lovena Le in August. Attempted to schedule with PA, patient is confused and declined. Please call thanks.

## 2016-10-10 DIAGNOSIS — I493 Ventricular premature depolarization: Secondary | ICD-10-CM | POA: Diagnosis not present

## 2016-10-10 DIAGNOSIS — R5383 Other fatigue: Secondary | ICD-10-CM | POA: Diagnosis not present

## 2016-10-18 ENCOUNTER — Encounter: Payer: Self-pay | Admitting: Internal Medicine

## 2016-10-18 ENCOUNTER — Ambulatory Visit: Payer: Medicare Other | Admitting: Internal Medicine

## 2016-10-18 ENCOUNTER — Encounter (INDEPENDENT_AMBULATORY_CARE_PROVIDER_SITE_OTHER): Payer: Self-pay

## 2016-10-18 ENCOUNTER — Ambulatory Visit (INDEPENDENT_AMBULATORY_CARE_PROVIDER_SITE_OTHER): Payer: Medicare Other | Admitting: Internal Medicine

## 2016-10-18 VITALS — BP 116/86 | HR 73 | Ht 69.0 in | Wt 225.8 lb

## 2016-10-18 DIAGNOSIS — I2510 Atherosclerotic heart disease of native coronary artery without angina pectoris: Secondary | ICD-10-CM | POA: Diagnosis not present

## 2016-10-18 DIAGNOSIS — I493 Ventricular premature depolarization: Secondary | ICD-10-CM

## 2016-10-18 NOTE — Patient Instructions (Signed)

## 2016-10-18 NOTE — Progress Notes (Signed)
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 He has minimal palpitations. His PVC's did not get worse with exercise. The patient has not had synocpe and he does not feel irriegular heart beats. He does note some fatigue. No edema.  Allergies  Allergen Reactions  . Niacin Itching    And swelling     Current Outpatient Prescriptions  Medication Sig Dispense Refill  . aspirin EC 81 MG tablet Take 81 mg by mouth daily.    Marland Kitchen atorvastatin (LIPITOR) 80 MG tablet Take 40 mg by mouth daily.    Marland Kitchen ezetimibe (ZETIA) 10 MG tablet Take 10 mg by mouth daily with breakfast.    . fish oil-omega-3 fatty acids 1000 MG capsule Take 2 g by mouth daily with breakfast.     . metoprolol succinate (TOPROL-XL) 25 MG 24 hr tablet TAKE 1/2 TABLET BY MOUTH EVERY DAY WITH BREAKFAST 45 tablet 3  . Multiple Vitamin (MULTIVITAMIN) capsule Take 1 capsule by mouth daily.      . pantoprazole (PROTONIX) 40 MG tablet Take 1 tablet (40 mg total) by mouth daily. 30 tablet 1  . Tofacitinib Citrate (XELJANZ) 5 MG TABS Take 5 mg by mouth daily.     No current facility-administered medications for this visit.      Past Medical History:  Diagnosis Date  . Arthritis   . CAD (coronary artery disease)    PTCA diagonal 2001, residual 60% LAD /  Nuclear 2006, no ischemia  . Carotid artery disease ([place])    Doppler, October, 2012, 0-39% bilateral stenoses.  . Diverticulitis   . Dyslipidemia   . Ejection fraction    EF normal,  . Eye abnormalities 08-23-2011   right ryr injection for bleed done by dr Zadie Rhine, dr Zadie Rhine told pt ok to have surgery 09-03-2011  . Family history of anesthesia complication    SISTER HAD MEMORY PROBLEMS POST OP  . Preop cardiovascular exam    Patient needs cardiac clearance for hernia surgery May, 2013  . Psoriatic arthritis ([place])    2012  . PVC's (premature ventricular contractions)    Frequent PVCs noted over the  years., These always decrease with exercise. This is documented  . Umbilical hernia 09/12/9483    ROS:   All systems reviewed and negative except as noted in the HPI.   Past Surgical History:  Procedure Laterality Date  . ANGIOPLASTY  12/1999 and 03/2000  . CARDIAC CATHETERIZATION  2001  . carpel tunnel  2009/2010   both wrists  . COLONOSCOPY N/A 10/30/2013   Procedure: COLONOSCOPY;  Surgeon: Beryle Beams, MD;  Location: [facility];  Service: Endoscopy;  Laterality: N/A;  . ESOPHAGOGASTRODUODENOSCOPY N/A 10/30/2013   Procedure: ESOPHAGOGASTRODUODENOSCOPY (EGD);  Surgeon: Beryle Beams, MD;  Location: [facility] ENDOSCOPY;  Service: Endoscopy;  Laterality: N/A;  . INGUINAL HERNIA REPAIR  09/03/2011   Procedure: HERNIA REPAIR INGUINAL ADULT;  Surgeon: Imogene Burn. Tsuei, MD;  [facility];  Service: General;  Laterality: Left;  Left Ingunal Hernia Repair with Mesh  . right knee meniscus repair  1990's  . UMBILICAL HERNIA REPAIR  09/03/2011   Procedure: HERNIA REPAIR UMBILICAL ADULT;  Surgeon: Imogene Burn. Georgette Dover, MD;  [facility];  Service: General;  Laterality: N/A;     Family History  Problem Relation Age of Onset  . Cancer Mother        uterine  . Cancer Sister  Breast  . Crohn's disease Cousin        distant  . Colon cancer Neg Hx   . Ulcerative colitis Neg Hx      Social History   Social History  . Marital status: Married    Spouse name: N/A  . Number of children: N/A  . Years of education: N/A   Occupational History  . Not on file.   Social History Main Topics  . Smoking status: Former Smoker    Packs/day: 1.50    Years: 25.00    Types: Cigarettes    Quit date: 12/08/1994  . Smokeless tobacco: Former Systems developer  . Alcohol use Yes     Comment: 2 drink per day  . Drug use: No  . Sexual activity: Not on file   Other Topics Concern  . Not on file   Social History Narrative   Does not use cane or walker, independent of IDLs     BP 116/86   Pulse 73    Ht 5\' 9"  (1.753 m)   Wt 225 lb 12.8 oz (102.4 kg)   SpO2 96%   BMI 33.34 kg/m   Physical Exam:  Well appearing 71 yo man, NAD HEENT: Unremarkable Neck:  6 cm JVD, no thyromegally Lymphatics:  No adenopathy Back:  No CVA tenderness Lungs:  Clear with no wheezes HEART:  Regular rate rhythm, no murmurs, no rubs, no clicks Abd:  soft, positive bowel sounds, no organomegally, no rebound, no guarding Ext:  2 plus pulses, no edema, no cyanosis, no clubbing Skin:  No rashes no nodules Neuro:  CN II through XII intact, motor grossly intact  EKG - NSR with frequent and consecutive PVC's  Assess/Plan: 1. PVC's - he has a LBBB morphology with transition at V3. I have discussed the treatment options with the patient which include watchful waiting, initiation of sotalol in the hospital or initiation of amiodarone or PVC ablation. The pro's and con's of each approach were outlined. He will continue his beta blocker for now and he will call us if his fatigue improves. I have let him know that only about one out of 20 patients have spontaneous improvement in there PVC's with out treatment. 2. CAD - no evidence of ischemia during his stress test. 3. HTN - his pressure is well controlled. Will follow.  Mikle Bosworth.D.

## 2016-10-25 DIAGNOSIS — R109 Unspecified abdominal pain: Secondary | ICD-10-CM | POA: Diagnosis not present

## 2016-11-05 DIAGNOSIS — K219 Gastro-esophageal reflux disease without esophagitis: Secondary | ICD-10-CM | POA: Diagnosis not present

## 2016-11-05 DIAGNOSIS — R140 Abdominal distension (gaseous): Secondary | ICD-10-CM | POA: Diagnosis not present

## 2016-11-05 DIAGNOSIS — E785 Hyperlipidemia, unspecified: Secondary | ICD-10-CM | POA: Diagnosis not present

## 2016-11-22 DIAGNOSIS — H25013 Cortical age-related cataract, bilateral: Secondary | ICD-10-CM | POA: Diagnosis not present

## 2016-11-22 DIAGNOSIS — H348112 Central retinal vein occlusion, right eye, stable: Secondary | ICD-10-CM | POA: Diagnosis not present

## 2016-11-22 DIAGNOSIS — H2513 Age-related nuclear cataract, bilateral: Secondary | ICD-10-CM | POA: Diagnosis not present

## 2016-11-22 DIAGNOSIS — H40013 Open angle with borderline findings, low risk, bilateral: Secondary | ICD-10-CM | POA: Diagnosis not present

## 2016-12-03 DIAGNOSIS — R1012 Left upper quadrant pain: Secondary | ICD-10-CM | POA: Diagnosis not present

## 2016-12-03 DIAGNOSIS — R140 Abdominal distension (gaseous): Secondary | ICD-10-CM | POA: Diagnosis not present

## 2016-12-04 DIAGNOSIS — M0609 Rheumatoid arthritis without rheumatoid factor, multiple sites: Secondary | ICD-10-CM | POA: Diagnosis not present

## 2016-12-04 DIAGNOSIS — M1990 Unspecified osteoarthritis, unspecified site: Secondary | ICD-10-CM | POA: Diagnosis not present

## 2016-12-04 DIAGNOSIS — Z79899 Other long term (current) drug therapy: Secondary | ICD-10-CM | POA: Diagnosis not present

## 2016-12-04 DIAGNOSIS — R21 Rash and other nonspecific skin eruption: Secondary | ICD-10-CM | POA: Diagnosis not present

## 2016-12-25 DIAGNOSIS — Z0000 Encounter for general adult medical examination without abnormal findings: Secondary | ICD-10-CM | POA: Diagnosis not present

## 2016-12-25 DIAGNOSIS — Z7982 Long term (current) use of aspirin: Secondary | ICD-10-CM | POA: Diagnosis not present

## 2016-12-25 DIAGNOSIS — I10 Essential (primary) hypertension: Secondary | ICD-10-CM | POA: Diagnosis not present

## 2016-12-25 DIAGNOSIS — Z125 Encounter for screening for malignant neoplasm of prostate: Secondary | ICD-10-CM | POA: Diagnosis not present

## 2016-12-25 DIAGNOSIS — Z23 Encounter for immunization: Secondary | ICD-10-CM | POA: Diagnosis not present

## 2016-12-28 DIAGNOSIS — Z0001 Encounter for general adult medical examination with abnormal findings: Secondary | ICD-10-CM | POA: Diagnosis not present

## 2016-12-28 DIAGNOSIS — G500 Trigeminal neuralgia: Secondary | ICD-10-CM | POA: Diagnosis not present

## 2016-12-28 DIAGNOSIS — R0989 Other specified symptoms and signs involving the circulatory and respiratory systems: Secondary | ICD-10-CM | POA: Diagnosis not present

## 2016-12-28 DIAGNOSIS — I2510 Atherosclerotic heart disease of native coronary artery without angina pectoris: Secondary | ICD-10-CM | POA: Diagnosis not present

## 2016-12-31 DIAGNOSIS — R140 Abdominal distension (gaseous): Secondary | ICD-10-CM | POA: Diagnosis not present

## 2017-03-01 DIAGNOSIS — H34811 Central retinal vein occlusion, right eye, with macular edema: Secondary | ICD-10-CM | POA: Diagnosis not present

## 2017-03-01 DIAGNOSIS — H35351 Cystoid macular degeneration, right eye: Secondary | ICD-10-CM | POA: Diagnosis not present

## 2017-03-01 DIAGNOSIS — H2513 Age-related nuclear cataract, bilateral: Secondary | ICD-10-CM | POA: Diagnosis not present

## 2017-03-01 DIAGNOSIS — H3582 Retinal ischemia: Secondary | ICD-10-CM | POA: Diagnosis not present

## 2017-04-08 DIAGNOSIS — Z79899 Other long term (current) drug therapy: Secondary | ICD-10-CM | POA: Diagnosis not present

## 2017-04-08 DIAGNOSIS — M1990 Unspecified osteoarthritis, unspecified site: Secondary | ICD-10-CM | POA: Diagnosis not present

## 2017-04-08 DIAGNOSIS — R21 Rash and other nonspecific skin eruption: Secondary | ICD-10-CM | POA: Diagnosis not present

## 2017-04-08 DIAGNOSIS — M0609 Rheumatoid arthritis without rheumatoid factor, multiple sites: Secondary | ICD-10-CM | POA: Diagnosis not present

## 2017-04-09 ENCOUNTER — Ambulatory Visit: Payer: Medicare Other | Admitting: Internal Medicine

## 2017-04-23 DIAGNOSIS — X32XXXD Exposure to sunlight, subsequent encounter: Secondary | ICD-10-CM | POA: Diagnosis not present

## 2017-04-23 DIAGNOSIS — D225 Melanocytic nevi of trunk: Secondary | ICD-10-CM | POA: Diagnosis not present

## 2017-04-23 DIAGNOSIS — L570 Actinic keratosis: Secondary | ICD-10-CM | POA: Diagnosis not present

## 2017-04-23 DIAGNOSIS — L400 Psoriasis vulgaris: Secondary | ICD-10-CM | POA: Diagnosis not present

## 2017-05-29 DIAGNOSIS — R197 Diarrhea, unspecified: Secondary | ICD-10-CM | POA: Diagnosis not present

## 2017-05-31 DIAGNOSIS — H35351 Cystoid macular degeneration, right eye: Secondary | ICD-10-CM | POA: Diagnosis not present

## 2017-05-31 DIAGNOSIS — H3582 Retinal ischemia: Secondary | ICD-10-CM | POA: Diagnosis not present

## 2017-05-31 DIAGNOSIS — H3561 Retinal hemorrhage, right eye: Secondary | ICD-10-CM | POA: Diagnosis not present

## 2017-05-31 DIAGNOSIS — H34811 Central retinal vein occlusion, right eye, with macular edema: Secondary | ICD-10-CM | POA: Diagnosis not present

## 2017-06-11 DIAGNOSIS — C4441 Basal cell carcinoma of skin of scalp and neck: Secondary | ICD-10-CM | POA: Diagnosis not present

## 2017-06-11 DIAGNOSIS — L570 Actinic keratosis: Secondary | ICD-10-CM | POA: Diagnosis not present

## 2017-06-11 DIAGNOSIS — X32XXXD Exposure to sunlight, subsequent encounter: Secondary | ICD-10-CM | POA: Diagnosis not present

## 2017-06-12 ENCOUNTER — Encounter: Payer: Self-pay | Admitting: Internal Medicine

## 2017-06-12 ENCOUNTER — Ambulatory Visit: Payer: Medicare Other | Admitting: Internal Medicine

## 2017-06-12 VITALS — BP 120/74 | HR 77 | Ht 69.0 in | Wt 228.0 lb

## 2017-06-12 DIAGNOSIS — I493 Ventricular premature depolarization: Secondary | ICD-10-CM | POA: Diagnosis not present

## 2017-06-12 DIAGNOSIS — I2510 Atherosclerotic heart disease of native coronary artery without angina pectoris: Secondary | ICD-10-CM

## 2017-06-12 NOTE — Patient Instructions (Signed)

## 2017-06-12 NOTE — Progress Notes (Signed)
HPI Mr. Eggebrecht returns today for followup of his very dense ventricular ectopy originating from the RV outflow tract. He has minimal palpitations. His PVC's did not get worse with exercise. The patient has not had syncope and he does not feel irriegular heart beats. He denies fatigue. No edema.  He remains active working as a Psychologist, sport and exercise.  When I last saw the patient he was having 40% PVCs.  Allergies  Allergen Reactions  . Niacin Itching    And swelling     Current Outpatient Medications  Medication Sig Dispense Refill  . aspirin EC 81 MG tablet Take 81 mg by mouth daily.    Marland Kitchen atorvastatin (LIPITOR) 80 MG tablet Take 40 mg by mouth daily.    Marland Kitchen ezetimibe (ZETIA) 10 MG tablet Take 10 mg by mouth daily with breakfast.    . fish oil-omega-3 fatty acids 1000 MG capsule Take 2 g by mouth daily with breakfast.     . metoprolol succinate (TOPROL-XL) 25 MG 24 hr tablet TAKE 1/2 TABLET BY MOUTH EVERY DAY WITH BREAKFAST 45 tablet 3  . Multiple Vitamin (MULTIVITAMIN) capsule Take 1 capsule by mouth daily.      . pantoprazole (PROTONIX) 40 MG tablet Take 1 tablet (40 mg total) by mouth daily. 30 tablet 1  . Tofacitinib Citrate (XELJANZ) 5 MG TABS Take 5 mg by mouth daily.     No current facility-administered medications for this visit.      Past Medical History:  Diagnosis Date  . Arthritis   . CAD (coronary artery disease)    PTCA diagonal 2001, residual 60% LAD /  Nuclear 2006, no ischemia  . Carotid artery disease ([place])    Doppler, October, 2012, 0-39% bilateral stenoses.  . Diverticulitis   . Dyslipidemia   . Ejection fraction    EF normal,  . Eye abnormalities 08-23-2011   right ryr injection for bleed done by dr Zadie Rhine, dr Zadie Rhine told pt ok to have surgery 09-03-2011  . Family history of anesthesia complication    SISTER HAD MEMORY PROBLEMS POST OP  . Preop cardiovascular exam    Patient needs cardiac clearance for hernia surgery May, 2013  . Psoriatic arthritis ([place])    2012   . PVC's (premature ventricular contractions)    Frequent PVCs noted over the years., These always decrease with exercise. This is documented  . Umbilical hernia 06/07/252    ROS:   All systems reviewed and negative except as noted in the HPI.   Past Surgical History:  Procedure Laterality Date  . ANGIOPLASTY  12/1999 and 03/2000  . CARDIAC CATHETERIZATION  2001  . carpel tunnel  2009/2010   both wrists  . COLONOSCOPY N/A 10/30/2013   Procedure: COLONOSCOPY;  Surgeon: Beryle Beams, MD;  Location: [place];  Service: Endoscopy;  Laterality: N/A;  . ESOPHAGOGASTRODUODENOSCOPY N/A 10/30/2013   Procedure: ESOPHAGOGASTRODUODENOSCOPY (EGD);  Surgeon: Beryle Beams, MD;  Location: [facility] ENDOSCOPY;  Service: Endoscopy;  Laterality: N/A;  . INGUINAL HERNIA REPAIR  09/03/2011   Procedure: HERNIA REPAIR INGUINAL ADULT;  Surgeon: Imogene Burn. Tsuei, MD;  Location: [facility];  Service: General;  Laterality: Left;  Left Ingunal Hernia Repair with Mesh  . right knee meniscus repair  1990's  . UMBILICAL HERNIA REPAIR  09/03/2011   Procedure: HERNIA REPAIR UMBILICAL ADULT;  Surgeon: Imogene Burn. Georgette Dover, MD;  Location: [facility];  Service: General;  Laterality: N/A;     Family History  Problem Relation Age of Onset  .  Cancer Mother        uterine  . Cancer Sister        Breast  . Crohn's disease Cousin        distant  . Colon cancer Neg Hx   . Ulcerative colitis Neg Hx      Social History   Socioeconomic History  . Marital status: Married    Spouse name: Not on file  . Number of children: Not on file  . Years of education: Not on file  . Highest education level: Not on file  Occupational History  . Not on file  Social Needs  . Financial resource strain: Not on file  . Food insecurity:    Worry: Not on file    Inability: Not on file  . Transportation needs:    Medical: Not on file    Non-medical: Not on file  Tobacco Use  . Smoking status: Former Smoker    Packs/day: 1.50    Years:  25.00    Pack years: 37.50    Types: Cigarettes    Last attempt to quit: 12/08/1994    Years since quitting: 22.5  . Smokeless tobacco: Former Network engineer and Sexual Activity  . Alcohol use: Yes    Comment: 2 drink per day  . Drug use: No  . Sexual activity: Not on file  Lifestyle  . Physical activity:    Days per week: Not on file    Minutes per session: Not on file  . Stress: Not on file  Relationships  . Social connections:    Talks on phone: Not on file    Gets together: Not on file    Attends religious service: Not on file    Active member of club or organization: Not on file    Attends meetings of clubs or organizations: Not on file    Relationship status: Not on file  . Intimate partner violence:    Fear of current or ex partner: Not on file    Emotionally abused: Not on file    Physically abused: Not on file    Forced sexual activity: Not on file  Other Topics Concern  . Not on file  Social History Narrative   Does not use cane or walker, independent of IDLs     BP 120/74   Pulse 77   Ht 5\' 9"  (1.753 m)   Wt 228 lb (103.4 kg)   BMI 33.67 kg/m   Physical Exam:  Well appearing 72 year old man, NAD HEENT: Unremarkable Neck: 6 cm JVD, no thyromegally Lymphatics:  No adenopathy Back:  No CVA tenderness Lungs:  Clear, with no wheezes, rales, or rhonchi HEART:  Regular rate rhythm, no murmurs, no rubs, no clicks Abd:  soft, positive bowel sounds, no organomegally, no rebound, no guarding Ext:  2 plus pulses, no edema, no cyanosis, no clubbing Skin:  No rashes no nodules Neuro:  CN II through XII intact, motor grossly intact  EKG -normal sinus rhythm with PVCs and couplets.  The transition of the PVCs is negative to positive in lead V3.  The axis is inferior.  AVR and aVL are both negative.  Assess/Plan: 1.  Asymptomatic PVCs -the patient is completely asymptomatic and has preserved left ventricular function last echocardiogram.  We discussed the  worrisome density of his PVCs.  However with normal left ventricular function, and the complete asymptomatic nature of his PVCs, and because he is not particularly inclined to consider catheter ablation, he will undergo  watchful waiting.  If he should develop shortness of breath, chest discomfort, or syncope, he is instructed to call us. 2.  Coronary artery disease -medical therapy for his PVCs would either be amiodarone or sotalol.  However the patient has no anginal symptoms. 3.  Hypertension -his systolic blood pressure is well controlled.    Crissie Sickles, MD

## 2017-07-08 DIAGNOSIS — M1990 Unspecified osteoarthritis, unspecified site: Secondary | ICD-10-CM | POA: Diagnosis not present

## 2017-07-08 DIAGNOSIS — Z79899 Other long term (current) drug therapy: Secondary | ICD-10-CM | POA: Diagnosis not present

## 2017-07-08 DIAGNOSIS — R21 Rash and other nonspecific skin eruption: Secondary | ICD-10-CM | POA: Diagnosis not present

## 2017-07-08 DIAGNOSIS — M0609 Rheumatoid arthritis without rheumatoid factor, multiple sites: Secondary | ICD-10-CM | POA: Diagnosis not present

## 2017-07-31 ENCOUNTER — Other Ambulatory Visit: Payer: Self-pay | Admitting: Nurse Practitioner

## 2017-07-31 DIAGNOSIS — R3915 Urgency of urination: Secondary | ICD-10-CM | POA: Diagnosis not present

## 2017-07-31 DIAGNOSIS — I10 Essential (primary) hypertension: Secondary | ICD-10-CM | POA: Diagnosis not present

## 2017-07-31 DIAGNOSIS — N390 Urinary tract infection, site not specified: Secondary | ICD-10-CM | POA: Diagnosis not present

## 2017-07-31 DIAGNOSIS — I2510 Atherosclerotic heart disease of native coronary artery without angina pectoris: Secondary | ICD-10-CM | POA: Diagnosis not present

## 2017-08-01 ENCOUNTER — Other Ambulatory Visit: Payer: Self-pay

## 2017-08-29 DIAGNOSIS — R399 Unspecified symptoms and signs involving the genitourinary system: Secondary | ICD-10-CM | POA: Diagnosis not present

## 2017-08-29 DIAGNOSIS — R3915 Urgency of urination: Secondary | ICD-10-CM | POA: Diagnosis not present

## 2017-08-29 DIAGNOSIS — N400 Enlarged prostate without lower urinary tract symptoms: Secondary | ICD-10-CM | POA: Diagnosis not present

## 2017-09-10 DIAGNOSIS — H3582 Retinal ischemia: Secondary | ICD-10-CM | POA: Diagnosis not present

## 2017-09-10 DIAGNOSIS — H34811 Central retinal vein occlusion, right eye, with macular edema: Secondary | ICD-10-CM | POA: Diagnosis not present

## 2017-09-10 DIAGNOSIS — H2513 Age-related nuclear cataract, bilateral: Secondary | ICD-10-CM | POA: Diagnosis not present

## 2017-09-10 DIAGNOSIS — H35351 Cystoid macular degeneration, right eye: Secondary | ICD-10-CM | POA: Diagnosis not present

## 2017-09-20 DIAGNOSIS — R3915 Urgency of urination: Secondary | ICD-10-CM | POA: Diagnosis not present

## 2017-09-26 DIAGNOSIS — R351 Nocturia: Secondary | ICD-10-CM | POA: Diagnosis not present

## 2017-09-26 DIAGNOSIS — R350 Frequency of micturition: Secondary | ICD-10-CM | POA: Diagnosis not present

## 2017-09-26 DIAGNOSIS — N411 Chronic prostatitis: Secondary | ICD-10-CM | POA: Diagnosis not present

## 2017-11-12 DIAGNOSIS — N411 Chronic prostatitis: Secondary | ICD-10-CM | POA: Diagnosis not present

## 2017-11-12 DIAGNOSIS — R350 Frequency of micturition: Secondary | ICD-10-CM | POA: Diagnosis not present

## 2017-11-26 DIAGNOSIS — H25013 Cortical age-related cataract, bilateral: Secondary | ICD-10-CM | POA: Diagnosis not present

## 2017-11-26 DIAGNOSIS — H34811 Central retinal vein occlusion, right eye, with macular edema: Secondary | ICD-10-CM | POA: Diagnosis not present

## 2017-11-26 DIAGNOSIS — H40013 Open angle with borderline findings, low risk, bilateral: Secondary | ICD-10-CM | POA: Diagnosis not present

## 2017-11-26 DIAGNOSIS — H2513 Age-related nuclear cataract, bilateral: Secondary | ICD-10-CM | POA: Diagnosis not present

## 2017-12-31 DIAGNOSIS — H35351 Cystoid macular degeneration, right eye: Secondary | ICD-10-CM | POA: Diagnosis not present

## 2017-12-31 DIAGNOSIS — H34811 Central retinal vein occlusion, right eye, with macular edema: Secondary | ICD-10-CM | POA: Diagnosis not present

## 2017-12-31 DIAGNOSIS — H3582 Retinal ischemia: Secondary | ICD-10-CM | POA: Diagnosis not present

## 2017-12-31 DIAGNOSIS — H2513 Age-related nuclear cataract, bilateral: Secondary | ICD-10-CM | POA: Diagnosis not present

## 2018-01-01 DIAGNOSIS — Z7982 Long term (current) use of aspirin: Secondary | ICD-10-CM | POA: Diagnosis not present

## 2018-01-01 DIAGNOSIS — Z125 Encounter for screening for malignant neoplasm of prostate: Secondary | ICD-10-CM | POA: Diagnosis not present

## 2018-01-01 DIAGNOSIS — I10 Essential (primary) hypertension: Secondary | ICD-10-CM | POA: Diagnosis not present

## 2018-01-06 DIAGNOSIS — Z0000 Encounter for general adult medical examination without abnormal findings: Secondary | ICD-10-CM | POA: Diagnosis not present

## 2018-01-06 DIAGNOSIS — I2510 Atherosclerotic heart disease of native coronary artery without angina pectoris: Secondary | ICD-10-CM | POA: Diagnosis not present

## 2018-01-06 DIAGNOSIS — R0989 Other specified symptoms and signs involving the circulatory and respiratory systems: Secondary | ICD-10-CM | POA: Diagnosis not present

## 2018-01-06 DIAGNOSIS — M0609 Rheumatoid arthritis without rheumatoid factor, multiple sites: Secondary | ICD-10-CM | POA: Diagnosis not present

## 2018-01-31 DIAGNOSIS — Z79899 Other long term (current) drug therapy: Secondary | ICD-10-CM | POA: Diagnosis not present

## 2018-01-31 DIAGNOSIS — R21 Rash and other nonspecific skin eruption: Secondary | ICD-10-CM | POA: Diagnosis not present

## 2018-01-31 DIAGNOSIS — M0609 Rheumatoid arthritis without rheumatoid factor, multiple sites: Secondary | ICD-10-CM | POA: Diagnosis not present

## 2018-01-31 DIAGNOSIS — M1990 Unspecified osteoarthritis, unspecified site: Secondary | ICD-10-CM | POA: Diagnosis not present

## 2018-04-02 DIAGNOSIS — R350 Frequency of micturition: Secondary | ICD-10-CM | POA: Diagnosis not present

## 2018-04-02 DIAGNOSIS — N411 Chronic prostatitis: Secondary | ICD-10-CM | POA: Diagnosis not present

## 2018-04-10 ENCOUNTER — Ambulatory Visit: Payer: Medicare Other | Admitting: Neurology

## 2018-04-10 ENCOUNTER — Encounter: Payer: Self-pay | Admitting: Neurology

## 2018-04-10 VITALS — BP 162/74 | HR 81 | Ht 69.0 in | Wt 225.0 lb

## 2018-04-10 DIAGNOSIS — R202 Paresthesia of skin: Secondary | ICD-10-CM

## 2018-04-10 DIAGNOSIS — G459 Transient cerebral ischemic attack, unspecified: Secondary | ICD-10-CM | POA: Diagnosis not present

## 2018-04-10 NOTE — Progress Notes (Signed)
Reason for visit: Facial numbness  Referring physician: Dr. Hilton Cork is a 73 y.o. male  History of present illness:  Mr. Kucinski is a 73 year old right-handed white male with a history of a prior right V3 distribution trigeminal neuralgia.  The patient was followed by Dr. Erling Cruz here previously, currently his trigeminal neuralgia has been under good control.  The patient returns to the office today with a new problem.  About 3 or 4 weeks ago, the patient had spontaneous onset of intermittent numbness that occurs on the left face that includes the temporal area, ear, face, and angle of the jaw.  The episodes will come and go, each episode lasting 3 to 4 minutes and then resolves.  The patient may have several events daily.  The patient reports no other symptoms such as headache, vision changes, double vision, speech or swallowing changes, numbness or weakness of the body or arms or legs.  The patient reports no change in balance, no dizziness or altered mentation.  He otherwise feels well.  There is no pain involved with the sensory changes.  He is sent to this office for an evaluation.  Past Medical History:  Diagnosis Date  . Arthritis   . CAD (coronary artery disease)    PTCA diagonal 2001, residual 60% LAD /  Nuclear 2006, no ischemia  . Carotid artery disease ([place])    Doppler, October, 2012, 0-39% bilateral stenoses.  . Diverticulitis   . Dyslipidemia   . Ejection fraction    EF normal,  . Eye abnormalities 08-23-2011   right ryr injection for bleed done by dr Zadie Rhine, dr Zadie Rhine told pt ok to have surgery 09-03-2011  . Family history of anesthesia complication    SISTER HAD MEMORY PROBLEMS POST OP  . Preop cardiovascular exam    Patient needs cardiac clearance for hernia surgery May, 2013  . Psoriatic arthritis ([place])    2012  . PVC's (premature ventricular contractions)    Frequent PVCs noted over the years., These always decrease with exercise. This is documented    . Umbilical hernia 9/35/7017    Past Surgical History:  Procedure Laterality Date  . ANGIOPLASTY  12/1999 and 03/2000  . CARDIAC CATHETERIZATION  2001  . carpel tunnel  2009/2010   both wrists  . COLONOSCOPY N/A 10/30/2013   Procedure: COLONOSCOPY;  Surgeon: Beryle Beams, MD;  Location: [place];  Service: Endoscopy;  Laterality: N/A;  . ESOPHAGOGASTRODUODENOSCOPY N/A 10/30/2013   Procedure: ESOPHAGOGASTRODUODENOSCOPY (EGD);  Surgeon: Beryle Beams, MD;  Location: [facility] ENDOSCOPY;  Service: Endoscopy;  Laterality: N/A;  . INGUINAL HERNIA REPAIR  09/03/2011   Procedure: HERNIA REPAIR INGUINAL ADULT;  Surgeon: Imogene Burn. Tsuei, MD;  [facility];  Service: General;  Laterality: Left;  Left Ingunal Hernia Repair with Mesh  . right knee meniscus repair  1990's  . UMBILICAL HERNIA REPAIR  09/03/2011   Procedure: HERNIA REPAIR UMBILICAL ADULT;  Surgeon: Imogene Burn. Georgette Dover, MD;  [facility];  Service: General;  Laterality: N/A;    Family History  Problem Relation Age of Onset  . Cancer Mother        uterine  . Cancer Sister        Breast  . Crohn's disease Cousin        distant  . Colon cancer Neg Hx   . Ulcerative colitis Neg Hx     Social history:  reports that he quit smoking about 23 years ago. His smoking  use included cigarettes. He has a 37.50 pack-year smoking history. He has quit using smokeless tobacco. He reports current alcohol use. He reports that he does not use drugs.  Medications:  Prior to Admission medications   Medication Sig Start Date End Date Taking? Authorizing Provider  aspirin EC 81 MG tablet Take 81 mg by mouth daily.   Yes [provider]  atorvastatin (LIPITOR) 80 MG tablet Take 40 mg by mouth daily.   Yes [provider]  ezetimibe (ZETIA) 10 MG tablet Take 10 mg by mouth daily with breakfast.   Yes [provider]  fish oil-omega-3 fatty acids 1000 MG capsule Take 2 g by mouth daily with breakfast.    Yes [provider]  metoprolol succinate (TOPROL-XL) 25 MG 24 hr tablet TAKE 1/2 TABLET BY MOUTH EVERY DAY WITH BREAKFAST 08/01/17  Yes Burtis Junes, NP  Multiple Vitamin (MULTIVITAMIN) capsule Take 1 capsule by mouth daily.     Yes [provider]  pantoprazole (PROTONIX) 40 MG tablet Take 1 tablet (40 mg total) by mouth daily. 10/30/13  Yes Samella Parr, NP  Tofacitinib Citrate (XELJANZ) 5 MG TABS Take 5 mg by mouth daily.   Yes [provider]      Allergies  Allergen Reactions  . Niacin Itching    And swelling    ROS:  Out of a complete 14 system review of symptoms, the patient complains only of the following symptoms, and all other reviewed systems are negative.  Numbness  Blood pressure (!) 162/74, pulse 81, height 5\' 9"  (1.753 m), weight 225 lb (102.1 kg).  Physical Exam  General: The patient is alert and cooperative at the time of the examination.  Eyes: Pupils are equal, round, and reactive to light. Discs are flat bilaterally.  Neck: The neck is supple, no carotid bruits are noted.  Respiratory: The respiratory examination is clear.  Cardiovascular: The cardiovascular examination reveals a regular rate and rhythm, no obvious murmurs or rubs are noted.  Skin: Extremities are without significant edema.  Neurologic Exam  Mental status: The patient is alert and oriented x 3 at the time of the examination. The patient has apparent normal recent and remote memory, with an apparently normal attention span and concentration ability.  Cranial nerves: Facial symmetry is present. There is good sensation of the face to pinprick and soft touch bilaterally. The strength of the facial muscles and the muscles to head turning and shoulder shrug are normal bilaterally. Speech is well enunciated, no aphasia or dysarthria is noted. Extraocular movements are full. Visual fields are full. The tongue is midline, and the patient has symmetric elevation of the soft  palate. No obvious hearing deficits are noted.  Motor: The motor testing reveals 5 over 5 strength of all 4 extremities. Good symmetric motor tone is noted throughout.  Sensory: Sensory testing is intact to pinprick, soft touch, vibration sensation, and position sense on all 4 extremities. No evidence of extinction is noted.  Coordination: Cerebellar testing reveals good finger-nose-finger and heel-to-shin bilaterally.  Gait and station: Gait is normal. Tandem gait is normal. Romberg is negative. No drift is seen.  Reflexes: Deep tendon reflexes are symmetric and normal bilaterally. Toes are downgoing bilaterally.   Assessment/Plan:  1.  Intermittent left facial numbness  The etiology of the numbness is not clear, the patient has no other associated symptoms.  He will be sent for MRI of the brain, he claims that he has had recent blood work  done through his primary care doctor in December 2019.  The patient will have carotid Doppler study, I will contact him concerning the results of the above.  Jill Alexanders MD 04/10/2018 3:00 PM  [facility] [address] [place], [place] [address]  Phone [phone] Fax [phone]

## 2018-04-11 ENCOUNTER — Telehealth: Payer: Self-pay | Admitting: Neurology

## 2018-04-11 NOTE — Telephone Encounter (Signed)
I left a voicemail informing the patient I sent the MRI order to GI I left GI phone number of [phone] and to give them a call if he has not heard from them in the next 2-3 business days.   Also, informed him that the Carotid study is scheduled for 05/08/18 and to arrive at 9:45 AM at [facility] cone at admitting and if that is not a good time or day I left their number of [phone] to reschedule if needed to.

## 2018-04-11 NOTE — Telephone Encounter (Signed)
[employer] Josem Kaufmann: [id number] (exp. 04/11/18 to 05/10/18) order sent to GI. They will reach out to the pt to schedule.

## 2018-04-17 ENCOUNTER — Other Ambulatory Visit: Payer: Self-pay | Admitting: Neurology

## 2018-04-17 DIAGNOSIS — S0541XA Penetrating wound of orbit with or without foreign body, right eye, initial encounter: Secondary | ICD-10-CM

## 2018-04-17 DIAGNOSIS — S0542XA Penetrating wound of orbit with or without foreign body, left eye, initial encounter: Principal | ICD-10-CM

## 2018-04-23 ENCOUNTER — Ambulatory Visit
Admission: RE | Admit: 2018-04-23 | Discharge: 2018-04-23 | Disposition: A | Discharge status: Home / Self Care | Payer: Medicare Other | Source: Ambulatory Visit | Attending: Neurology | Admitting: Neurology

## 2018-04-23 DIAGNOSIS — R202 Paresthesia of skin: Secondary | ICD-10-CM | POA: Diagnosis not present

## 2018-04-23 DIAGNOSIS — S0541XA Penetrating wound of orbit with or without foreign body, right eye, initial encounter: Secondary | ICD-10-CM

## 2018-04-23 DIAGNOSIS — S0542XA Penetrating wound of orbit with or without foreign body, left eye, initial encounter: Principal | ICD-10-CM

## 2018-04-23 MED ORDER — GADOBENATE DIMEGLUMINE 529 MG/ML IV SOLN
20.0000 mL | Freq: Once | INTRAVENOUS | Status: AC | PRN
Start: 2018-04-23 — End: 2018-04-23
  Administered 2018-04-23: 20 mL via INTRAVENOUS

## 2018-04-24 ENCOUNTER — Telehealth: Payer: Self-pay | Admitting: Neurology

## 2018-04-24 NOTE — Telephone Encounter (Signed)
I called the patient.  MRI of the brain shows minimal white matter changes, nothing that would explain his current symptoms.  A carotid Doppler study is pending, I will contact him when the result is available to me.   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  - No acute findings.

## 2018-05-08 ENCOUNTER — Ambulatory Visit (HOSPITAL_COMMUNITY)
Admission: RE | Admit: 2018-05-08 | Discharge: 2018-05-08 | Disposition: A | Discharge status: Home / Self Care | Payer: Medicare Other | Source: Ambulatory Visit | Attending: Neurology | Admitting: Neurology

## 2018-05-08 ENCOUNTER — Telehealth: Payer: Self-pay | Admitting: Neurology

## 2018-05-08 DIAGNOSIS — R202 Paresthesia of skin: Secondary | ICD-10-CM | POA: Insufficient documentation

## 2018-05-08 DIAGNOSIS — G459 Transient cerebral ischemic attack, unspecified: Secondary | ICD-10-CM | POA: Insufficient documentation

## 2018-05-08 NOTE — Progress Notes (Signed)
Carotid duplex exam completed. Please see preliminary notes on CV PROC under chart review. Jorge [name] H Jorge [name](RDMS RVT) 05/08/18 11:11 AM

## 2018-05-08 NOTE — Telephone Encounter (Signed)
I called the patient, talk with the wife.  The carotid Doppler study is unremarkable.  We will follow the patient conservatively, if the episodes of sensory alteration become uncomfortable, we may add a medication to help control this.  The etiology of the intermittent facial numbness events is not clear.   Carotid Doppler May 08, 2018:  Summary: Right Carotid: Velocities in the right ICA are consistent with a 1-39% stenosis.  Left Carotid: Velocities in the left ICA are consistent with a 1-39% stenosis.  Vertebrals: Bilateral vertebral arteries demonstrate antegrade flow.

## 2018-06-04 DIAGNOSIS — H35351 Cystoid macular degeneration, right eye: Secondary | ICD-10-CM | POA: Diagnosis not present

## 2018-06-04 DIAGNOSIS — H3582 Retinal ischemia: Secondary | ICD-10-CM | POA: Diagnosis not present

## 2018-06-04 DIAGNOSIS — H3561 Retinal hemorrhage, right eye: Secondary | ICD-10-CM | POA: Diagnosis not present

## 2018-06-04 DIAGNOSIS — H34811 Central retinal vein occlusion, right eye, with macular edema: Secondary | ICD-10-CM | POA: Diagnosis not present

## 2018-06-12 ENCOUNTER — Telehealth: Payer: Self-pay

## 2018-06-12 NOTE — Telephone Encounter (Signed)
Left detailed message per DPR.  Asked for call back to discuss upcoming yrly follow up.  Sent MyChart sign up.

## 2018-06-13 NOTE — Telephone Encounter (Signed)
Call placed to all numbers listed including wife and daughter.  No answer.

## 2018-06-17 ENCOUNTER — Telehealth: Payer: Medicare Other | Admitting: Internal Medicine

## 2018-06-25 NOTE — Telephone Encounter (Signed)
Recall entered for Pt.

## 2018-08-01 DIAGNOSIS — M0609 Rheumatoid arthritis without rheumatoid factor, multiple sites: Secondary | ICD-10-CM | POA: Diagnosis not present

## 2018-08-01 DIAGNOSIS — Z79899 Other long term (current) drug therapy: Secondary | ICD-10-CM | POA: Diagnosis not present

## 2018-08-01 DIAGNOSIS — M1990 Unspecified osteoarthritis, unspecified site: Secondary | ICD-10-CM | POA: Diagnosis not present

## 2018-08-01 DIAGNOSIS — M5030 Other cervical disc degeneration, unspecified cervical region: Secondary | ICD-10-CM | POA: Diagnosis not present

## 2018-10-02 DIAGNOSIS — H3582 Retinal ischemia: Secondary | ICD-10-CM | POA: Diagnosis not present

## 2018-10-02 DIAGNOSIS — H34811 Central retinal vein occlusion, right eye, with macular edema: Secondary | ICD-10-CM | POA: Diagnosis not present

## 2018-10-02 DIAGNOSIS — H35351 Cystoid macular degeneration, right eye: Secondary | ICD-10-CM | POA: Diagnosis not present

## 2018-10-02 DIAGNOSIS — H2513 Age-related nuclear cataract, bilateral: Secondary | ICD-10-CM | POA: Diagnosis not present

## 2018-10-31 ENCOUNTER — Other Ambulatory Visit: Payer: Self-pay

## 2018-10-31 ENCOUNTER — Ambulatory Visit (INDEPENDENT_AMBULATORY_CARE_PROVIDER_SITE_OTHER): Payer: Medicare Other | Admitting: Student

## 2018-10-31 VITALS — BP 128/68 | HR 69 | Ht 69.0 in | Wt 224.0 lb

## 2018-10-31 DIAGNOSIS — I493 Ventricular premature depolarization: Secondary | ICD-10-CM

## 2018-10-31 NOTE — Progress Notes (Signed)
PCP:  Deland Pretty, MD Primary Cardiologist: No primary care provider on file. Electrophysiologist: None   Jorge [name] is a 73 y.o. male who presents today for routine electrophysiology followup. They are seen for Dr Lovena Le.   Since last being seen in our clinic, the patient reports doing very well.  He has minimal palpitations. He does his ADLs without difficulty. He occasionally gets SOB with moderate exertion. He denies symptoms of chest pain, shortness of breath, orthopnea, PND, lower extremity edema, claudication, dizziness, presyncope, syncope, bleeding, or neurologic sequela. The patient is tolerating medications without difficulties.    The patient feels that he is tolerating medications without difficulties and is otherwise without complaint today.   Past Medical History:  Diagnosis Date  . Arthritis   . CAD (coronary artery disease)    PTCA diagonal 2001, residual 60% LAD /  Nuclear 2006, no ischemia  . Carotid artery disease ([place])    Doppler, October, 2012, 0-39% bilateral stenoses.  . Diverticulitis   . Dyslipidemia   . Ejection fraction    EF normal,  . Eye abnormalities 08-23-2011   right ryr injection for bleed done by dr Zadie Rhine, dr Zadie Rhine told pt ok to have surgery 09-03-2011  . Family history of anesthesia complication    SISTER HAD MEMORY PROBLEMS POST OP  . Preop cardiovascular exam    Patient needs cardiac clearance for hernia surgery May, 2013  . Psoriatic arthritis ([place])    2012  . PVC's (premature ventricular contractions)    Frequent PVCs noted over the years., These always decrease with exercise. This is documented  . Umbilical hernia 8/41/3244   Past Surgical History:  Procedure Laterality Date  . ANGIOPLASTY  12/1999 and 03/2000  . CARDIAC CATHETERIZATION  2001  . carpel tunnel  2009/2010   both wrists  . COLONOSCOPY N/A 10/30/2013   Procedure: COLONOSCOPY;  Surgeon: Beryle Beams, MD;  Location: [place];  Service: Endoscopy;  Laterality:  N/A;  . ESOPHAGOGASTRODUODENOSCOPY N/A 10/30/2013   Procedure: ESOPHAGOGASTRODUODENOSCOPY (EGD);  Surgeon: Beryle Beams, MD;  Location: [facility] ENDOSCOPY;  Service: Endoscopy;  Laterality: N/A;  . INGUINAL HERNIA REPAIR  09/03/2011   Procedure: HERNIA REPAIR INGUINAL ADULT;  Surgeon: Imogene Burn. Tsuei, MD;  [facility];  Service: General;  Laterality: Left;  Left Ingunal Hernia Repair with Mesh  . right knee meniscus repair  1990's  . UMBILICAL HERNIA REPAIR  09/03/2011   Procedure: HERNIA REPAIR UMBILICAL ADULT;  Surgeon: Imogene Burn. Georgette Dover, MD;  [facility];  Service: General;  Laterality: N/A;    Current Outpatient Medications  Medication Sig Dispense Refill  . aspirin EC 81 MG tablet Take 81 mg by mouth daily.    Marland Kitchen atorvastatin (LIPITOR) 80 MG tablet Take 40 mg by mouth daily.    Marland Kitchen ezetimibe (ZETIA) 10 MG tablet Take 10 mg by mouth daily with breakfast.    . fish oil-omega-3 fatty acids 1000 MG capsule Take 2 g by mouth daily with breakfast.     . metoprolol succinate (TOPROL-XL) 25 MG 24 hr tablet TAKE 1/2 TABLET BY MOUTH EVERY DAY WITH BREAKFAST 45 tablet 3  . Multiple Vitamin (MULTIVITAMIN) capsule Take 1 capsule by mouth daily.      . pantoprazole (PROTONIX) 40 MG tablet Take 1 tablet (40 mg total) by mouth daily. 30 tablet 1  . Tofacitinib Citrate (XELJANZ) 5 MG TABS Take 5 mg by mouth daily.     No current facility-administered medications for this visit.  Allergies  Allergen Reactions  . Niacin Itching    And swelling    Social History   Socioeconomic History  . Marital status: Married    Spouse name: Not on file  . Number of children: 1  . Years of education: Not on file  . Highest education level: Some college, no degree  Occupational History  . Not on file  Social Needs  . Financial resource strain: Not on file  . Food insecurity    Worry: Not on file    Inability: Not on file  . Transportation needs    Medical: Not on file    Non-medical: Not on file   Tobacco Use  . Smoking status: Former Smoker    Packs/day: 1.50    Years: 25.00    Pack years: 37.50    Types: Cigarettes    Quit date: 12/08/1994    Years since quitting: 23.9  . Smokeless tobacco: Former Network engineer and Sexual Activity  . Alcohol use: Yes    Comment: 2 drink per day  . Drug use: No  . Sexual activity: Not on file  Lifestyle  . Physical activity    Days per week: Not on file    Minutes per session: Not on file  . Stress: Not on file  Relationships  . Social Herbalist on phone: Not on file    Gets together: Not on file    Attends religious service: Not on file    Active member of club or organization: Not on file    Attends meetings of clubs or organizations: Not on file    Relationship status: Not on file  . Intimate partner violence    Fear of current or ex partner: Not on file    Emotionally abused: Not on file    Physically abused: Not on file    Forced sexual activity: Not on file  Other Topics Concern  . Not on file  Social History Narrative   Does not use cane or walker, independent of IDLs   Right handed    Caffeine 1 cup daily    Lives at home with spouse      Review of Systems: General: No chills, fever, night sweats or weight changes  Cardiovascular:  No chest pain, dyspnea on exertion, edema, orthopnea, palpitations, paroxysmal nocturnal dyspnea Dermatological: No rash, lesions or masses Respiratory: No cough, dyspnea Urologic: No hematuria, dysuria Abdominal: No nausea, vomiting, diarrhea, bright red blood per rectum, melena, or hematemesis Neurologic: No visual changes, weakness, changes in mental status All other systems reviewed and are otherwise negative except as noted above.  Physical Exam: Vitals:   10/31/18 1055  BP: 128/68  Pulse: 69  Weight: 224 lb (101.6 kg)  Height: 5\' 9"  (1.753 m)    GEN- The patient is well appearing, alert and oriented x 3 today.   HEENT: normocephalic, atraumatic; sclera clear,  conjunctiva pink; hearing intact; oropharynx clear; neck supple, no JVP Lymph- no cervical lymphadenopathy Lungs- Clear to ausculation bilaterally, normal work of breathing.  No wheezes, rales, rhonchi Heart- Regular rate and rhythm, no murmurs, rubs or gallops, PMI not laterally displaced GI- soft, non-tender, non-distended, bowel sounds present, no hepatosplenomegaly Extremities- no clubbing, cyanosis, or edema; DP/PT/radial pulses 2+ bilaterally MS- no significant deformity or atrophy Skin- warm and dry, no rash or lesion Psych- euthymic mood, full affect Neuro- strength and sensation are intact  EKG is ordered today. Personal review shows NSR with frequent PVCs, including  bigeminy.  Assessment and Plan:  1. Asymptomatic; frequent PVCs  By EKG today pt continues to have frequent PVCs which appear RVOT in origin. His EF has been normal. Last check 07/2016. Thus, will repeat echo and continue to follow.   Pt knows to call us with with symptoms of SOB, chest discomfort, or syncope.   2. CAD No exertional chest pain. Would need to use amiodarone or sotalol to treat his PVCs if AAD therapy pursued.  3. HTN Stable on current regimen.  Will plan follow up with Dr. Lovena Le in 6 months to further discuss AAD. If EF down, would look to quantify his PVCs with monitoring and begin therapy for PVC suppression.   Shirley Friar, PA-C  10/31/18 1:30 PM

## 2018-10-31 NOTE — Patient Instructions (Addendum)
Medication Instructions:  Your physician recommends that you continue on your current medications as directed. Please refer to the Current Medication list given to you today.  If you need a refill on your cardiac medications before your next appointment, please call your pharmacy.   Lab work: NONE ORDERED  TODAY   If you have labs (blood work) drawn today and your tests are completely normal, you will receive your results only by: Marland Kitchen MyChart Message (if you have MyChart) OR . A paper copy in the mail If you have any lab test that is abnormal or we need to change your treatment, we will call you to review the results.  Testing/Procedures: Your physician has requested that you have an echocardiogram. Echocardiography is a painless test that uses sound waves to create images of your heart. It provides your doctor with information about the size and shape of your heart and how well your heart's chambers and valves are working. This procedure takes approximately one hour. There are no restrictions for this procedure.    Follow-Up: At [facility], you and your health needs are our priority.  As part of our continuing mission to provide you with exceptional heart care, we have created designated Provider Care Teams.  These Care Teams include your primary Cardiologist (physician) and Advanced Practice Providers (APPs -  Physician Assistants and Nurse Practitioners) who all work together to provide you with the care you need, when you need it. You will need a follow up appointment in 6 months.  Please call our office 2 months in advance to schedule this appointment.  You may see Dr. Lovena Le  or one of the following Advanced Practice Providers on your designated Care Team:   Chanetta Marshall, NP . Tommye Standard, PA-C . Joesph July  PA-C   Any Other Special Instructions Will Be Listed Below (If Applicable).

## 2018-11-06 ENCOUNTER — Other Ambulatory Visit: Payer: Self-pay

## 2018-11-06 ENCOUNTER — Ambulatory Visit (HOSPITAL_COMMUNITY): Payer: Medicare Other | Attending: Cardiovascular Disease

## 2018-11-06 DIAGNOSIS — E785 Hyperlipidemia, unspecified: Secondary | ICD-10-CM | POA: Diagnosis not present

## 2018-11-06 DIAGNOSIS — Z79899 Other long term (current) drug therapy: Secondary | ICD-10-CM | POA: Diagnosis not present

## 2018-11-06 DIAGNOSIS — I2510 Atherosclerotic heart disease of native coronary artery without angina pectoris: Secondary | ICD-10-CM | POA: Insufficient documentation

## 2018-11-06 DIAGNOSIS — R002 Palpitations: Secondary | ICD-10-CM | POA: Diagnosis not present

## 2018-11-06 DIAGNOSIS — I493 Ventricular premature depolarization: Secondary | ICD-10-CM

## 2018-11-06 DIAGNOSIS — Z7982 Long term (current) use of aspirin: Secondary | ICD-10-CM | POA: Diagnosis not present

## 2018-11-07 ENCOUNTER — Telehealth: Payer: Self-pay | Admitting: *Deleted

## 2018-11-07 ENCOUNTER — Other Ambulatory Visit: Payer: Self-pay | Admitting: *Deleted

## 2018-11-07 DIAGNOSIS — R350 Frequency of micturition: Secondary | ICD-10-CM | POA: Diagnosis not present

## 2018-11-07 DIAGNOSIS — I493 Ventricular premature depolarization: Secondary | ICD-10-CM

## 2018-11-07 DIAGNOSIS — N411 Chronic prostatitis: Secondary | ICD-10-CM | POA: Diagnosis not present

## 2018-11-07 NOTE — Telephone Encounter (Signed)
Left voice mail on home and cell to contact office back about results and Holter monitor  recommendations.

## 2018-11-12 NOTE — Telephone Encounter (Signed)
Spoke with wife who is a aware of results and awating a phone call about Holter monitor.

## 2018-11-13 ENCOUNTER — Telehealth: Payer: Self-pay | Admitting: Radiology

## 2018-11-13 NOTE — Telephone Encounter (Signed)
Attempted to reach patient to verify his info and briefly go over the monitor that was ordered for him.

## 2018-11-19 ENCOUNTER — Telehealth: Payer: Self-pay | Admitting: *Deleted

## 2018-11-19 NOTE — Telephone Encounter (Signed)
° ° °  Please return call to patient °

## 2018-11-19 NOTE — Telephone Encounter (Signed)
3 day ZIO XT long term holter monitor to be mailed to the patients home.  Patient required to wear the monitor at least 48 hours but, can wear it up to 3 days.  Instructions reviewed briefly as they are included in the monitor kit.

## 2018-11-21 NOTE — Telephone Encounter (Signed)
Monitor mailed 9/2

## 2018-12-01 ENCOUNTER — Ambulatory Visit (INDEPENDENT_AMBULATORY_CARE_PROVIDER_SITE_OTHER): Payer: Medicare Other

## 2018-12-01 DIAGNOSIS — I493 Ventricular premature depolarization: Secondary | ICD-10-CM | POA: Diagnosis not present

## 2018-12-02 ENCOUNTER — Encounter: Payer: Self-pay | Admitting: Internal Medicine

## 2018-12-02 DIAGNOSIS — H40013 Open angle with borderline findings, low risk, bilateral: Secondary | ICD-10-CM | POA: Diagnosis not present

## 2018-12-02 DIAGNOSIS — H2513 Age-related nuclear cataract, bilateral: Secondary | ICD-10-CM | POA: Diagnosis not present

## 2018-12-02 DIAGNOSIS — H34811 Central retinal vein occlusion, right eye, with macular edema: Secondary | ICD-10-CM | POA: Diagnosis not present

## 2018-12-02 DIAGNOSIS — H25013 Cortical age-related cataract, bilateral: Secondary | ICD-10-CM | POA: Diagnosis not present

## 2018-12-15 DIAGNOSIS — I493 Ventricular premature depolarization: Secondary | ICD-10-CM | POA: Diagnosis not present

## 2018-12-16 ENCOUNTER — Other Ambulatory Visit: Payer: Self-pay | Admitting: Internal Medicine

## 2018-12-16 ENCOUNTER — Other Ambulatory Visit: Payer: Self-pay

## 2018-12-16 DIAGNOSIS — I493 Ventricular premature depolarization: Secondary | ICD-10-CM

## 2018-12-24 ENCOUNTER — Telehealth: Payer: Self-pay

## 2018-12-24 NOTE — Telephone Encounter (Signed)
LMTCB

## 2018-12-24 NOTE — Telephone Encounter (Signed)
-----   Message from Evans Lance, MD sent at 12/21/2018  5:37 PM EDT ----- No significant arrhythmias

## 2018-12-31 DIAGNOSIS — N411 Chronic prostatitis: Secondary | ICD-10-CM | POA: Diagnosis not present

## 2018-12-31 DIAGNOSIS — R350 Frequency of micturition: Secondary | ICD-10-CM | POA: Diagnosis not present

## 2019-01-07 DIAGNOSIS — Z125 Encounter for screening for malignant neoplasm of prostate: Secondary | ICD-10-CM | POA: Diagnosis not present

## 2019-01-07 DIAGNOSIS — I10 Essential (primary) hypertension: Secondary | ICD-10-CM | POA: Diagnosis not present

## 2019-01-07 NOTE — Telephone Encounter (Signed)
Left detailed message per DPR advising normal monitor results.  Advised to keep follow up as planned and call with any further needs.

## 2019-01-12 DIAGNOSIS — Z0000 Encounter for general adult medical examination without abnormal findings: Secondary | ICD-10-CM | POA: Diagnosis not present

## 2019-01-12 DIAGNOSIS — D229 Melanocytic nevi, unspecified: Secondary | ICD-10-CM | POA: Diagnosis not present

## 2019-01-12 DIAGNOSIS — I10 Essential (primary) hypertension: Secondary | ICD-10-CM | POA: Diagnosis not present

## 2019-01-12 DIAGNOSIS — I2510 Atherosclerotic heart disease of native coronary artery without angina pectoris: Secondary | ICD-10-CM | POA: Diagnosis not present

## 2019-01-21 DIAGNOSIS — L821 Other seborrheic keratosis: Secondary | ICD-10-CM | POA: Diagnosis not present

## 2019-01-21 DIAGNOSIS — D225 Melanocytic nevi of trunk: Secondary | ICD-10-CM | POA: Diagnosis not present

## 2019-01-21 DIAGNOSIS — X32XXXD Exposure to sunlight, subsequent encounter: Secondary | ICD-10-CM | POA: Diagnosis not present

## 2019-01-21 DIAGNOSIS — L570 Actinic keratosis: Secondary | ICD-10-CM | POA: Diagnosis not present

## 2019-02-03 DIAGNOSIS — M5030 Other cervical disc degeneration, unspecified cervical region: Secondary | ICD-10-CM | POA: Diagnosis not present

## 2019-02-03 DIAGNOSIS — M1990 Unspecified osteoarthritis, unspecified site: Secondary | ICD-10-CM | POA: Diagnosis not present

## 2019-02-03 DIAGNOSIS — Z79899 Other long term (current) drug therapy: Secondary | ICD-10-CM | POA: Diagnosis not present

## 2019-02-03 DIAGNOSIS — M0609 Rheumatoid arthritis without rheumatoid factor, multiple sites: Secondary | ICD-10-CM | POA: Diagnosis not present

## 2019-02-05 DIAGNOSIS — H2513 Age-related nuclear cataract, bilateral: Secondary | ICD-10-CM | POA: Diagnosis not present

## 2019-02-05 DIAGNOSIS — H34811 Central retinal vein occlusion, right eye, with macular edema: Secondary | ICD-10-CM | POA: Diagnosis not present

## 2019-02-05 DIAGNOSIS — H35351 Cystoid macular degeneration, right eye: Secondary | ICD-10-CM | POA: Diagnosis not present

## 2019-03-09 DIAGNOSIS — I779 Disorder of arteries and arterioles, unspecified: Secondary | ICD-10-CM | POA: Diagnosis not present

## 2019-04-07 ENCOUNTER — Ambulatory Visit: Payer: Medicare Other | Attending: Internal Medicine

## 2019-04-07 DIAGNOSIS — Z23 Encounter for immunization: Secondary | ICD-10-CM | POA: Insufficient documentation

## 2019-04-07 NOTE — Progress Notes (Signed)
   Covid-19 Vaccination Clinic  Name:  Jorge [name]    MRN: [id number] DOB: [birth year]  04/07/2019  Jorge [name] was observed post Covid-19 immunization for 15 minutes without incidence. He was provided with Vaccine Information Sheet and instruction to access the V-Safe system.   Jorge [name] was instructed to call 911 with any severe reactions post vaccine: Marland Kitchen Difficulty breathing  . Swelling of your face and throat  . A fast heartbeat  . A bad rash all over your body  . Dizziness and weakness    Immunizations Administered    Name Date Dose VIS Date Route   Pfizer COVID-19 Vaccine 04/07/2019  1:25 PM 0.3 mL 02/27/2019 Intramuscular   Manufacturer: [place]   Lot: [id number]   [place]: [id number]

## 2019-04-28 ENCOUNTER — Ambulatory Visit: Payer: Medicare Other | Attending: Internal Medicine

## 2019-04-28 DIAGNOSIS — Z23 Encounter for immunization: Secondary | ICD-10-CM | POA: Insufficient documentation

## 2019-04-28 NOTE — Progress Notes (Signed)
   Covid-19 Vaccination Clinic  Name:  Jorge [name]    MRN: [id number] DOB: 1945-09-12  04/28/2019  [name] was observed post Covid-19 immunization for 15 minutes without incidence. He was provided with Vaccine Information Sheet and instruction to access the V-Safe system.   [name] was instructed to call 911 with any severe reactions post vaccine: Marland Kitchen Difficulty breathing  . Swelling of your face and throat  . A fast heartbeat  . A bad rash all over your body  . Dizziness and weakness    Immunizations Administered    Name Date Dose VIS Date Route   Pfizer COVID-19 Vaccine 04/28/2019  3:24 PM 0.3 mL 02/27/2019 Intramuscular   Manufacturer: [place]   Lot: [id number]   [place]: [id number]

## 2019-05-15 ENCOUNTER — Encounter: Payer: Self-pay | Admitting: Internal Medicine

## 2019-05-15 ENCOUNTER — Other Ambulatory Visit: Payer: Self-pay

## 2019-05-15 ENCOUNTER — Ambulatory Visit: Payer: Medicare Other | Admitting: Internal Medicine

## 2019-05-15 VITALS — BP 146/84 | HR 79 | Ht 70.0 in | Wt 220.0 lb

## 2019-05-15 DIAGNOSIS — I2510 Atherosclerotic heart disease of native coronary artery without angina pectoris: Secondary | ICD-10-CM | POA: Diagnosis not present

## 2019-05-15 DIAGNOSIS — I493 Ventricular premature depolarization: Secondary | ICD-10-CM

## 2019-05-15 NOTE — Patient Instructions (Addendum)
Medication Instructions:  Your physician recommends that you continue on your current medications as directed. Please refer to the Current Medication list given to you today.  Labwork: None ordered.  Testing/Procedures: Your physician has recommended that you have an ablation. Catheter ablation is a medical procedure used to treat some cardiac arrhythmias (irregular heartbeats). During catheter ablation, a long, thin, flexible tube is put into a blood vessel in your groin (upper thigh), or neck. This tube is called an ablation catheter. It is then guided to your heart through the blood vessel. Radio frequency waves destroy small areas of heart tissue where abnormal heartbeats may cause an arrhythmia to start. Please see the instruction sheet given to you today.   Follow-Up: The following dates are available for procedures: March 15, 18, 23, 24, 30 April 1, 5, 12, 15, 19, 26  Any Other Special Instructions Will Be Listed Below (If Applicable).  If you need a refill on your cardiac medications before your next appointment, please call your pharmacy.     Cardiac Ablation Cardiac ablation is a procedure to disable (ablate) a small amount of heart tissue in very specific places. The heart has many electrical connections. Sometimes these connections are abnormal and can cause the heart to beat very fast or irregularly. Ablating some of the problem areas can improve the heart rhythm or return it to normal. Ablation may be done for people who:  Have Wolff-Parkinson-White syndrome.  Have fast heart rhythms (tachycardia).  Have taken medicines for an abnormal heart rhythm (arrhythmia) that were not effective or caused side effects.  Have a high-risk heartbeat that may be life-threatening. During the procedure, a small incision is made in the neck or the groin, and a long, thin, flexible tube (catheter) is inserted into the incision and moved to the heart. Small devices (electrodes) on the tip of  the catheter will send out electrical currents. A type of X-ray (fluoroscopy) will be used to help guide the catheter and to provide images of the heart. Tell a health care provider about:  Any allergies you have.  All medicines you are taking, including vitamins, herbs, eye drops, creams, and over-the-counter medicines.  Any problems you or family members have had with anesthetic medicines.  Any blood disorders you have.  Any surgeries you have had.  Any medical conditions you have, such as kidney failure.  Whether you are pregnant or may be pregnant. What are the risks? Generally, this is a safe procedure. However, problems may occur, including:  Infection.  Bruising and bleeding at the catheter insertion site.  Bleeding into the chest, especially into the sac that surrounds the heart. This is a serious complication.  Stroke or blood clots.  Damage to other structures or organs.  Allergic reaction to medicines or dyes.  Need for a permanent pacemaker if the normal electrical system is damaged. A pacemaker is a small computer that sends electrical signals to the heart and helps your heart beat normally.  The procedure not being fully effective. This may not be recognized until months later. Repeat ablation procedures are sometimes required. What happens before the procedure?  Follow instructions from your health care provider about eating or drinking restrictions.  Ask your health care provider about: ? Changing or stopping your regular medicines. This is especially important if you are taking diabetes medicines or blood thinners. ? Taking medicines such as aspirin and ibuprofen. These medicines can thin your blood. Do not take these medicines before your procedure if your health  care provider instructs you not to.  Plan to have someone take you home from the hospital or clinic.  If you will be going home right after the procedure, plan to have someone with you for 24  hours. What happens during the procedure?  To lower your risk of infection: ? Your health care team will wash or sanitize their hands. ? Your skin will be washed with soap. ? Hair may be removed from the incision area.  An IV tube will be inserted into one of your veins.  You will be given a medicine to help you relax (sedative).  The skin on your neck or groin will be numbed.  An incision will be made in your neck or your groin.  A needle will be inserted through the incision and into a large vein in your neck or groin.  A catheter will be inserted into the needle and moved to your heart.  Dye may be injected through the catheter to help your surgeon see the area of the heart that needs treatment.  Electrical currents will be sent from the catheter to ablate heart tissue in desired areas. There are three types of energy that may be used to ablate heart tissue: ? Heat (radiofrequency energy). ? Laser energy. ? Extreme cold (cryoablation).  When the necessary tissue has been ablated, the catheter will be removed.  Pressure will be held on the catheter insertion area to prevent excessive bleeding.  A bandage (dressing) will be placed over the catheter insertion area. The procedure may vary among health care providers and hospitals. What happens after the procedure?  Your blood pressure, heart rate, breathing rate, and blood oxygen level will be monitored until the medicines you were given have worn off.  Your catheter insertion area will be monitored for bleeding. You will need to lie still for a few hours to ensure that you do not bleed from the catheter insertion area.  Do not drive for 24 hours or as long as directed by your health care provider. Summary  Cardiac ablation is a procedure to disable (ablate) a small amount of heart tissue in very specific places. Ablating some of the problem areas can improve the heart rhythm or return it to normal.  During the procedure,  electrical currents will be sent from the catheter to ablate heart tissue in desired areas. This information is not intended to replace advice given to you by your health care provider. Make sure you discuss any questions you have with your health care provider. Document Revised: 08/26/2017 Document Reviewed: 01/23/2016 Elsevier Patient Education  [place].

## 2019-05-15 NOTE — Progress Notes (Signed)
HPI Mr. 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 He is a pleasant 74 yo man with minimally symptomatic PVC's and previously preserved LV function. He has been on medical therapy with beta blockers and no improvement. The patient has not had syncope. He has undergone repeat 2D echo which showed that his LV function has gone from 55-60 down to 45%. He has not had any palpitations despite his ventricular ectopy. Allergies  Allergen Reactions  . Niacin Itching    And swelling     Current Outpatient Medications  Medication Sig Dispense Refill  . amoxicillin (AMOXIL) 500 MG capsule Take 1 capsule by mouth every 6 (six) hours.    Marland Kitchen aspirin EC 81 MG tablet Take 81 mg by mouth daily.    Marland Kitchen atorvastatin (LIPITOR) 80 MG tablet Take 40 mg by mouth daily.    Marland Kitchen ezetimibe (ZETIA) 10 MG tablet Take 10 mg by mouth daily with breakfast.    . fish oil-omega-3 fatty acids 1000 MG capsule Take 2 g by mouth daily with breakfast.     . HYDROcodone-acetaminophen (NORCO/VICODIN) 5-325 MG tablet Take 1 tablet by mouth 5 (five) times daily as needed for pain.    . metoprolol succinate (TOPROL-XL) 25 MG 24 hr tablet TAKE 1/2 TABLET BY MOUTH EVERY DAY WITH BREAKFAST 45 tablet 3  . Multiple Vitamin (MULTIVITAMIN) capsule Take 1 capsule by mouth daily.      Marland Kitchen MYRBETRIQ 50 MG TB24 tablet Take 50 mg by mouth daily.    . pantoprazole (PROTONIX) 40 MG tablet Take 1 tablet (40 mg total) by mouth daily. 30 tablet 1  . Tofacitinib Citrate (XELJANZ) 5 MG TABS Take 5 mg by mouth daily.     No current facility-administered medications for this visit.     Past Medical History:  Diagnosis Date  . Arthritis   . CAD (coronary artery disease)    PTCA diagonal 2001, residual 60% LAD /  Nuclear 2006, no ischemia  . Carotid artery disease ([place])    Doppler, October, 2012, 0-39% bilateral stenoses.  . Diverticulitis   . Dyslipidemia   . Ejection fraction    EF normal,  . Eye abnormalities 08-23-2011   right ryr  injection for bleed done by dr Zadie Rhine, dr Zadie Rhine told pt ok to have surgery 09-03-2011  . Family history of anesthesia complication    SISTER HAD MEMORY PROBLEMS POST OP  . Preop cardiovascular exam    Patient needs cardiac clearance for hernia surgery May, 2013  . Psoriatic arthritis ([place])    2012  . PVC's (premature ventricular contractions)    Frequent PVCs noted over the years., These always decrease with exercise. This is documented  . Umbilical hernia [id number]    ROS:   All systems reviewed and negative except as noted in the HPI.   Past Surgical History:  Procedure Laterality Date  . ANGIOPLASTY  12/1999 and 03/2000  . CARDIAC CATHETERIZATION  2001  . carpel tunnel  2009/2010   both wrists  . COLONOSCOPY N/A 10/30/2013   Procedure: COLONOSCOPY;  Surgeon: Beryle Beams, MD;  Location: [facility];  Service: Endoscopy;  Laterality: N/A;  . ESOPHAGOGASTRODUODENOSCOPY N/A 10/30/2013   Procedure: ESOPHAGOGASTRODUODENOSCOPY (EGD);  Surgeon: Beryle Beams, MD;  Location: [facility] ENDOSCOPY;  Service: Endoscopy;  Laterality: N/A;  . INGUINAL HERNIA REPAIR  09/03/2011   Procedure: HERNIA REPAIR INGUINAL ADULT;  Surgeon: Imogene Burn. Georgette Dover, MD;  Location: [facility];  Service: General;  Laterality: Left;  Left Ingunal Hernia Repair with Mesh  . right knee meniscus repair  1990's  . UMBILICAL HERNIA REPAIR  09/03/2011   Procedure: HERNIA REPAIR UMBILICAL ADULT;  Surgeon: Imogene Burn. Georgette Dover, MD;  Location: [facility];  Service: General;  Laterality: N/A;     Family History  Problem Relation Age of Onset  . Cancer Mother        uterine  . Cancer Sister        Breast  . Crohn's disease Cousin        distant  . Colon cancer Neg Hx   . Ulcerative colitis Neg Hx      Social History   Socioeconomic History  . Marital status: Married    Spouse name: Not on file  . Number of children: 1  . Years of education: Not on file  . Highest education level: Some college, no degree  Occupational History   . Not on file  Tobacco Use  . Smoking status: Former Smoker    Packs/day: 1.50    Years: 25.00    Pack years: 37.50    Types: Cigarettes    Quit date: 12/08/1994    Years since quitting: 24.4  . Smokeless tobacco: Former Network engineer and Sexual Activity  . Alcohol use: Yes    Comment: 2 drink per day  . Drug use: No  . Sexual activity: Not on file  Other Topics Concern  . Not on file  Social History Narrative   Does not use cane or walker, independent of IDLs   Right handed    Caffeine 1 cup daily    Lives at home with spouse    Social Determinants of Health   Financial Resource Strain:   . Difficulty of Paying Living Expenses: Not on file  Food Insecurity:   . Worried About Charity fundraiser in the Last Year: Not on file  . Ran Out of Food in the Last Year: Not on file  Transportation Needs:   . Lack of Transportation (Medical): Not on file  . Lack of Transportation (Non-Medical): Not on file  Physical Activity:   . Days of Exercise per Week: Not on file  . Minutes of Exercise per Session: Not on file  Stress:   . Feeling of Stress : Not on file  Social Connections:   . Frequency of Communication with Friends and Family: Not on file  . Frequency of Social Gatherings with Friends and Family: Not on file  . Attends Religious Services: Not on file  . Active Member of Clubs or Organizations: Not on file  . Attends Archivist Meetings: Not on file  . Marital Status: Not on file  Intimate Partner Violence:   . Fear of Current or Ex-Partner: Not on file  . Emotionally Abused: Not on file  . Physically Abused: Not on file  . Sexually Abused: Not on file     BP (!) 146/84   Pulse 79   Ht 5\' 10"  (1.778 m)   Wt 220 lb (99.8 kg)   SpO2 97%   BMI 31.57 kg/m   Physical Exam:  Well appearing NAD HEENT: Unremarkable Neck:  No JVD, no thyromegally Lymphatics:  No adenopathy Back:  No CVA tenderness Lungs:  Clear with no wheezes HEART:  Regular rate  rhythm, no murmurs, no rubs, no clicks Abd:  soft, positive bowel sounds, no organomegally, no rebound, no guarding Ext:  2 plus pulses, no edema, no cyanosis, no clubbing Skin:  No rashes no nodules Neuro:  CN II through XII intact, motor grossly intact  EKG - NSR with PVC's in a trigeminal fashion.  Assess/Plan: 1. PVC's I have discussed the treatment options with the patient. Because he continues to have PVC's and because he has developed LV dysfunciont, and we have no good meds for him, I have recommended catheter ablation. He will call us if he wishes to proceed. 2. LV dysfunction - his EF is 45%. Hopefully this will improve if we can get rid of the PVC's. 3. CAD - he is active and denies anginal symptoms.   Mikle Bosworth.D.

## 2019-05-15 NOTE — H&P (View-Only) (Signed)
HPI [name] returns today for followup of his PVC's. He is a pleasant 74 yo man with minimally symptomatic PVC's and previously preserved LV function. He has been on medical therapy with beta blockers and no improvement. The patient has not had syncope. He has undergone repeat 2D echo which showed that his LV function has gone from 55-60 down to 45%. He has not had any palpitations despite his ventricular ectopy. Allergies  Allergen Reactions  . Niacin Itching    And swelling     Current Outpatient Medications  Medication Sig Dispense Refill  . amoxicillin (AMOXIL) 500 MG capsule Take 1 capsule by mouth every 6 (six) hours.    Marland Kitchen aspirin EC 81 MG tablet Take 81 mg by mouth daily.    Marland Kitchen atorvastatin (LIPITOR) 80 MG tablet Take 40 mg by mouth daily.    Marland Kitchen ezetimibe (ZETIA) 10 MG tablet Take 10 mg by mouth daily with breakfast.    . fish oil-omega-3 fatty acids 1000 MG capsule Take 2 g by mouth daily with breakfast.     . HYDROcodone-acetaminophen (NORCO/VICODIN) 5-325 MG tablet Take 1 tablet by mouth 5 (five) times daily as needed for pain.    . metoprolol succinate (TOPROL-XL) 25 MG 24 hr tablet TAKE 1/2 TABLET BY MOUTH EVERY DAY WITH BREAKFAST 45 tablet 3  . Multiple Vitamin (MULTIVITAMIN) capsule Take 1 capsule by mouth daily.      Marland Kitchen MYRBETRIQ 50 MG TB24 tablet Take 50 mg by mouth daily.    . pantoprazole (PROTONIX) 40 MG tablet Take 1 tablet (40 mg total) by mouth daily. 30 tablet 1  . Tofacitinib Citrate (XELJANZ) 5 MG TABS Take 5 mg by mouth daily.     No current facility-administered medications for this visit.     Past Medical History:  Diagnosis Date  . Arthritis   . CAD (coronary artery disease)    PTCA diagonal 2001, residual 60% LAD /  Nuclear 2006, no ischemia  . Carotid artery disease ([place])    Doppler, October, 2012, 0-39% bilateral stenoses.  . Diverticulitis   . Dyslipidemia   . Ejection fraction    EF normal,  . Eye abnormalities 08-23-2011   right ryr  injection for bleed done by dr Zadie Rhine, dr Zadie Rhine told pt ok to have surgery 09-03-2011  . Family history of anesthesia complication    SISTER HAD MEMORY PROBLEMS POST OP  . Preop cardiovascular exam    Patient needs cardiac clearance for hernia surgery May, 2013  . Psoriatic arthritis ([place])    2012  . PVC's (premature ventricular contractions)    Frequent PVCs noted over the years., These always decrease with exercise. This is documented  . Umbilical hernia [id number]    ROS:   All systems reviewed and negative except as noted in the HPI.   Past Surgical History:  Procedure Laterality Date  . ANGIOPLASTY  12/1999 and 03/2000  . CARDIAC CATHETERIZATION  2001  . carpel tunnel  2009/2010   both wrists  . COLONOSCOPY N/A 10/30/2013   Procedure: COLONOSCOPY;  Surgeon: Beryle Beams, MD;  Location: [facility];  Service: Endoscopy;  Laterality: N/A;  . ESOPHAGOGASTRODUODENOSCOPY N/A 10/30/2013   Procedure: ESOPHAGOGASTRODUODENOSCOPY (EGD);  Surgeon: Beryle Beams, MD;  Location: [facility] ENDOSCOPY;  Service: Endoscopy;  Laterality: N/A;  . INGUINAL HERNIA REPAIR  09/03/2011   Procedure: HERNIA REPAIR INGUINAL ADULT;  Surgeon: Imogene Burn. Georgette Dover, MD;  Location: [facility];  Service: General;  Laterality: Left;  Left Ingunal Hernia Repair with Mesh  . right knee meniscus repair  1990's  . UMBILICAL HERNIA REPAIR  09/03/2011   Procedure: HERNIA REPAIR UMBILICAL ADULT;  Surgeon: Imogene Burn. Georgette Dover, MD;  Location: [facility];  Service: General;  Laterality: N/A;     Family History  Problem Relation Age of Onset  . Cancer Mother        uterine  . Cancer Sister        Breast  . Crohn's disease Cousin        distant  . Colon cancer Neg Hx   . Ulcerative colitis Neg Hx      Social History   Socioeconomic History  . Marital status: Married    Spouse name: Not on file  . Number of children: 1  . Years of education: Not on file  . Highest education level: Some college, no degree  Occupational History   . Not on file  Tobacco Use  . Smoking status: Former Smoker    Packs/day: 1.50    Years: 25.00    Pack years: 37.50    Types: Cigarettes    Quit date: 12/08/1994    Years since quitting: 24.4  . Smokeless tobacco: Former Network engineer and Sexual Activity  . Alcohol use: Yes    Comment: 2 drink per day  . Drug use: No  . Sexual activity: Not on file  Other Topics Concern  . Not on file  Social History Narrative   Does not use cane or walker, independent of IDLs   Right handed    Caffeine 1 cup daily    Lives at home with spouse    Social Determinants of Health   Financial Resource Strain:   . Difficulty of Paying Living Expenses: Not on file  Food Insecurity:   . Worried About Charity fundraiser in the Last Year: Not on file  . Ran Out of Food in the Last Year: Not on file  Transportation Needs:   . Lack of Transportation (Medical): Not on file  . Lack of Transportation (Non-Medical): Not on file  Physical Activity:   . Days of Exercise per Week: Not on file  . Minutes of Exercise per Session: Not on file  Stress:   . Feeling of Stress : Not on file  Social Connections:   . Frequency of Communication with Friends and Family: Not on file  . Frequency of Social Gatherings with Friends and Family: Not on file  . Attends Religious Services: Not on file  . Active Member of Clubs or Organizations: Not on file  . Attends Archivist Meetings: Not on file  . Marital Status: Not on file  Intimate Partner Violence:   . Fear of Current or Ex-Partner: Not on file  . Emotionally Abused: Not on file  . Physically Abused: Not on file  . Sexually Abused: Not on file     BP (!) 146/84   Pulse 79   Ht 5\' 10"  (1.778 m)   Wt 220 lb (99.8 kg)   SpO2 97%   BMI 31.57 kg/m   Physical Exam:  Well appearing NAD HEENT: Unremarkable Neck:  No JVD, no thyromegally Lymphatics:  No adenopathy Back:  No CVA tenderness Lungs:  Clear with no wheezes HEART:  Regular rate  rhythm, no murmurs, no rubs, no clicks Abd:  soft, positive bowel sounds, no organomegally, no rebound, no guarding Ext:  2 plus pulses, no edema, no cyanosis, no clubbing Skin:  No rashes no nodules Neuro:  CN II through XII intact, motor grossly intact  EKG - NSR with PVC's in a trigeminal fashion.  Assess/Plan: 1. PVC's I have discussed the treatment options with the patient. Because he continues to have PVC's and because he has developed LV dysfunciont, and we have no good meds for him, I have recommended catheter ablation. He will call us if he wishes to proceed. 2. LV dysfunction - his EF is 45%. Hopefully this will improve if we can get rid of the PVC's. 3. CAD - he is active and denies anginal symptoms.   Mikle Bosworth.D.

## 2019-05-22 ENCOUNTER — Telehealth: Payer: Self-pay | Admitting: Internal Medicine

## 2019-05-22 DIAGNOSIS — I493 Ventricular premature depolarization: Secondary | ICD-10-CM

## 2019-05-22 NOTE — Telephone Encounter (Signed)
New Message:   Pt is ready to schedule his Ablation please

## 2019-05-25 NOTE — Telephone Encounter (Signed)
Call returned to Pt.    Pt scheduled for PVC ablation on June 09, 2019 at 8:00 am  Will get labs/covid test on June 05, 2019.  See instruction letter

## 2019-05-25 NOTE — Addendum Note (Signed)
Addended by: Willeen Cass A on: 05/25/2019 05:01 PM   Modules accepted: Orders

## 2019-05-27 NOTE — Telephone Encounter (Signed)
Call placed to Pt's wife.  Advised Pt's procedure moved 2 hours later.  He should arrive at [facility] at 8:00 am on March 23.  Mailing instruction letter today.  Work up complete.

## 2019-06-04 DIAGNOSIS — H2513 Age-related nuclear cataract, bilateral: Secondary | ICD-10-CM | POA: Diagnosis not present

## 2019-06-04 DIAGNOSIS — H3582 Retinal ischemia: Secondary | ICD-10-CM | POA: Diagnosis not present

## 2019-06-04 DIAGNOSIS — H35351 Cystoid macular degeneration, right eye: Secondary | ICD-10-CM | POA: Diagnosis not present

## 2019-06-04 DIAGNOSIS — H34811 Central retinal vein occlusion, right eye, with macular edema: Secondary | ICD-10-CM | POA: Diagnosis not present

## 2019-06-05 ENCOUNTER — Other Ambulatory Visit: Payer: Self-pay

## 2019-06-05 ENCOUNTER — Other Ambulatory Visit (HOSPITAL_COMMUNITY)
Admission: RE | Admit: 2019-06-05 | Discharge: 2019-06-05 | Disposition: A | Discharge status: Home / Self Care | Payer: Medicare Other | Source: Ambulatory Visit | Attending: Internal Medicine | Admitting: Internal Medicine

## 2019-06-05 ENCOUNTER — Other Ambulatory Visit: Payer: Medicare Other | Admitting: *Deleted

## 2019-06-05 DIAGNOSIS — I493 Ventricular premature depolarization: Secondary | ICD-10-CM

## 2019-06-05 DIAGNOSIS — Z01812 Encounter for preprocedural laboratory examination: Secondary | ICD-10-CM | POA: Insufficient documentation

## 2019-06-05 DIAGNOSIS — Z20822 Contact with and (suspected) exposure to covid-19: Secondary | ICD-10-CM | POA: Diagnosis not present

## 2019-06-05 LAB — BASIC METABOLIC PANEL
BUN/Creatinine Ratio: 16 (ref 10–24)
BUN: 19 mg/dL (ref 8–27)
CO2: 26 mmol/L (ref 20–29)
Calcium: 9.3 mg/dL (ref 8.6–10.2)
Chloride: 103 mmol/L (ref 96–106)
Creatinine, Ser: 1.17 mg/dL (ref 0.76–1.27)
GFR calc Af Amer: 70 mL/min/{1.73_m2} (ref >59)
GFR calc non Af Amer: 61 mL/min/{1.73_m2} (ref >59)
Glucose: 133 mg/dL — ABNORMAL HIGH (ref 65–99)
Potassium: 4.5 mmol/L (ref 3.5–5.2)
Sodium: 142 mmol/L (ref 134–144)

## 2019-06-05 LAB — CBC WITH DIFFERENTIAL/PLATELET
Basophils Absolute: 0.1 10*3/uL (ref 0.0–0.2)
Basos: 1 %
EOS (ABSOLUTE): 0.4 10*3/uL (ref 0.0–0.4)
Eos: 7 %
Hematocrit: 42.4 % (ref 37.5–51.0)
Hemoglobin: 15.1 g/dL (ref 13.0–17.7)
Immature Grans (Abs): 0 10*3/uL (ref 0.0–0.1)
Immature Granulocytes: 0 %
Lymphocytes Absolute: 1.7 10*3/uL (ref 0.7–3.1)
Lymphs: 28 %
MCH: 32.5 pg (ref 26.6–33.0)
MCHC: 35.6 g/dL (ref 31.5–35.7)
MCV: 91 fL (ref 79–97)
Monocytes Absolute: 0.5 10*3/uL (ref 0.1–0.9)
Monocytes: 8 %
Neutrophils Absolute: 3.3 10*3/uL (ref 1.4–7.0)
Neutrophils: 56 %
Platelets: 204 10*3/uL (ref 150–450)
RBC: 4.64 x10E6/uL (ref 4.14–5.80)
RDW: 12.7 % (ref 11.6–15.4)
WBC: 5.9 10*3/uL (ref 3.4–10.8)

## 2019-06-05 LAB — SARS CORONAVIRUS 2 (TAT 6-24 HRS): SARS Coronavirus 2: NEGATIVE

## 2019-06-08 NOTE — Progress Notes (Signed)
Instructed patient on the following items: Arrival time 0800 Nothing to eat or drink after midnight No meds AM of procedure Responsible person to drive you home and stay with you for 24 hrs

## 2019-06-09 ENCOUNTER — Encounter (HOSPITAL_COMMUNITY): Payer: Self-pay | Admitting: Internal Medicine

## 2019-06-09 ENCOUNTER — Ambulatory Visit (HOSPITAL_COMMUNITY)
Admission: RE | Disposition: A | Discharge status: Home / Self Care | Payer: Self-pay | Source: Home / Self Care | Attending: Internal Medicine

## 2019-06-09 ENCOUNTER — Other Ambulatory Visit: Payer: Self-pay

## 2019-06-09 ENCOUNTER — Ambulatory Visit (HOSPITAL_COMMUNITY)
Admission: RE | Admit: 2019-06-09 | Discharge: 2019-06-10 | Disposition: A | Discharge status: Home / Self Care | Payer: Medicare Other | Attending: Internal Medicine | Admitting: Internal Medicine

## 2019-06-09 DIAGNOSIS — I493 Ventricular premature depolarization: Secondary | ICD-10-CM | POA: Diagnosis not present

## 2019-06-09 DIAGNOSIS — M1990 Unspecified osteoarthritis, unspecified site: Secondary | ICD-10-CM | POA: Diagnosis not present

## 2019-06-09 DIAGNOSIS — Z7982 Long term (current) use of aspirin: Secondary | ICD-10-CM | POA: Diagnosis not present

## 2019-06-09 DIAGNOSIS — I2510 Atherosclerotic heart disease of native coronary artery without angina pectoris: Secondary | ICD-10-CM | POA: Diagnosis not present

## 2019-06-09 DIAGNOSIS — Z888 Allergy status to other drugs, medicaments and biological substances status: Secondary | ICD-10-CM | POA: Diagnosis not present

## 2019-06-09 DIAGNOSIS — Z79899 Other long term (current) drug therapy: Secondary | ICD-10-CM | POA: Diagnosis not present

## 2019-06-09 DIAGNOSIS — Z87891 Personal history of nicotine dependence: Secondary | ICD-10-CM | POA: Diagnosis not present

## 2019-06-09 DIAGNOSIS — E785 Hyperlipidemia, unspecified: Secondary | ICD-10-CM | POA: Insufficient documentation

## 2019-06-09 HISTORY — PX: PVC ABLATION: EP1236

## 2019-06-09 HISTORY — PX: ABLATION: SHX5711

## 2019-06-09 LAB — POCT ACTIVATED CLOTTING TIME
Activated Clotting Time: 197 seconds
Activated Clotting Time: 219 seconds
Activated Clotting Time: 224 seconds
Activated Clotting Time: 175 seconds

## 2019-06-09 SURGERY — PVC ABLATION

## 2019-06-09 MED ORDER — AMOXICILLIN 500 MG PO CAPS: 500.0000 mg | ORAL_CAPSULE | Freq: Four times a day (QID) | ORAL | Status: DC

## 2019-06-09 MED ORDER — OMEGA-3-ACID ETHYL ESTERS 1 G PO CAPS
2.0000 g | ORAL_CAPSULE | Freq: Every day | ORAL | Status: DC
  Administered 2019-06-10: 09:00:00 2 g via ORAL
  Filled 2019-06-09 (×2): qty 2

## 2019-06-09 MED ORDER — FENTANYL CITRATE (PF) 100 MCG/2ML IJ SOLN
INTRAMUSCULAR | Status: DC | PRN
  Administered 2019-06-09 (×3): 12.5 ug via INTRAVENOUS

## 2019-06-09 MED ORDER — TOFACITINIB CITRATE 5 MG PO TABS: 5.0000 mg | ORAL_TABLET | Freq: Every day | ORAL | Status: DC

## 2019-06-09 MED ORDER — METOPROLOL SUCCINATE ER 25 MG PO TB24
12.5000 mg | ORAL_TABLET | Freq: Every day | ORAL | Status: DC
  Administered 2019-06-09 – 2019-06-10 (×2): 12.5 mg via ORAL
  Filled 2019-06-09 (×2): qty 1

## 2019-06-09 MED ORDER — ACETAMINOPHEN 325 MG PO TABS
650.0000 mg | ORAL_TABLET | ORAL | Status: DC | PRN
  Administered 2019-06-10: 650 mg via ORAL
  Filled 2019-06-09: qty 2

## 2019-06-09 MED ORDER — ASPIRIN EC 81 MG PO TBEC
81.0000 mg | DELAYED_RELEASE_TABLET | Freq: Every day | ORAL | Status: DC
  Administered 2019-06-09 – 2019-06-10 (×2): 81 mg via ORAL
  Filled 2019-06-09 (×2): qty 1

## 2019-06-09 MED ORDER — MIRABEGRON ER 25 MG PO TB24
50.0000 mg | ORAL_TABLET | Freq: Every day | ORAL | Status: DC
  Administered 2019-06-09 – 2019-06-10 (×2): 50 mg via ORAL
  Filled 2019-06-09 (×2): qty 2

## 2019-06-09 MED ORDER — OMEGA-3 FATTY ACIDS 1000 MG PO CAPS: 2.0000 g | ORAL_CAPSULE | Freq: Every day | ORAL | Status: DC

## 2019-06-09 MED ORDER — MIDAZOLAM HCL 5 MG/5ML IJ SOLN
INTRAMUSCULAR | Status: AC
  Filled 2019-06-09: qty 5

## 2019-06-09 MED ORDER — EZETIMIBE 10 MG PO TABS
10.0000 mg | ORAL_TABLET | Freq: Every day | ORAL | Status: DC
  Administered 2019-06-10: 10 mg via ORAL
  Filled 2019-06-09 (×2): qty 1

## 2019-06-09 MED ORDER — SODIUM CHLORIDE 0.9% FLUSH
3.0000 mL | Freq: Two times a day (BID) | INTRAVENOUS | Status: DC
  Administered 2019-06-09: 22:00:00 3 mL via INTRAVENOUS

## 2019-06-09 MED ORDER — BUPIVACAINE HCL (PF) 0.25 % IJ SOLN
INTRAMUSCULAR | Status: DC | PRN
  Administered 2019-06-09: 60 mL

## 2019-06-09 MED ORDER — ATORVASTATIN CALCIUM 40 MG PO TABS
40.0000 mg | ORAL_TABLET | Freq: Every day | ORAL | Status: DC
  Administered 2019-06-09: 18:00:00 40 mg via ORAL
  Filled 2019-06-09: qty 1

## 2019-06-09 MED ORDER — FENTANYL CITRATE (PF) 100 MCG/2ML IJ SOLN
INTRAMUSCULAR | Status: AC
  Filled 2019-06-09: qty 2

## 2019-06-09 MED ORDER — HEPARIN SODIUM (PORCINE) 1000 UNIT/ML IJ SOLN
INTRAMUSCULAR | Status: DC | PRN
  Administered 2019-06-09: 7000 [IU] via INTRAVENOUS
  Administered 2019-06-09: 3000 [IU] via INTRAVENOUS
  Administered 2019-06-09: 2000 [IU] via INTRAVENOUS

## 2019-06-09 MED ORDER — PANTOPRAZOLE SODIUM 40 MG PO TBEC
40.0000 mg | DELAYED_RELEASE_TABLET | Freq: Every day | ORAL | Status: DC
  Administered 2019-06-09 – 2019-06-10 (×2): 40 mg via ORAL
  Filled 2019-06-09 (×2): qty 1

## 2019-06-09 MED ORDER — HEPARIN (PORCINE) IN NACL 1000-0.9 UT/500ML-% IV SOLN
INTRAVENOUS | Status: DC | PRN
  Administered 2019-06-09: 500 mL

## 2019-06-09 MED ORDER — SODIUM CHLORIDE 0.9% FLUSH: 3.0000 mL | INTRAVENOUS | Status: DC | PRN

## 2019-06-09 MED ORDER — SODIUM CHLORIDE 0.9 % IV SOLN: INTRAVENOUS | Status: DC

## 2019-06-09 MED ORDER — ONDANSETRON HCL 4 MG/2ML IJ SOLN: 4.0000 mg | Freq: Four times a day (QID) | INTRAMUSCULAR | Status: DC | PRN

## 2019-06-09 MED ORDER — SODIUM CHLORIDE 0.9 % IV SOLN: 250.0000 mL | INTRAVENOUS | Status: DC | PRN

## 2019-06-09 MED ORDER — HEPARIN (PORCINE) IN NACL 2000-0.9 UNIT/L-% IV SOLN
INTRAVENOUS | Status: DC | PRN
  Administered 2019-06-09: 500 mL

## 2019-06-09 MED ORDER — MIDAZOLAM HCL 5 MG/5ML IJ SOLN
INTRAMUSCULAR | Status: DC | PRN
  Administered 2019-06-09 (×4): 1 mg via INTRAVENOUS

## 2019-06-09 MED ORDER — LIDOCAINE HCL (PF) 1 % IJ SOLN
INTRAMUSCULAR | Status: AC
  Filled 2019-06-09: qty 60

## 2019-06-09 MED ORDER — HEPARIN SODIUM (PORCINE) 1000 UNIT/ML IJ SOLN
INTRAMUSCULAR | Status: AC
  Filled 2019-06-09: qty 1

## 2019-06-09 MED ORDER — HEPARIN (PORCINE) IN NACL 1000-0.9 UT/500ML-% IV SOLN
INTRAVENOUS | Status: AC
  Filled 2019-06-09: qty 500

## 2019-06-09 SURGICAL SUPPLY — 12 items
BAG SNAP BAND KOVER 36X36 (MISCELLANEOUS) ×1 IMPLANT
CATH DECANAV F CURVE (CATHETERS) ×1 IMPLANT
CATH EZ STEER NAV 4MM D-F CUR (ABLATOR) ×1 IMPLANT
CATH JOSEPH QUAD ALLRED 6F REP (CATHETERS) ×1 IMPLANT
PACK EP LATEX FREE (CUSTOM PROCEDURE TRAY) ×2
PACK EP LF (CUSTOM PROCEDURE TRAY) ×1 IMPLANT
PAD PRO RADIOLUCENT 2001M-C (PAD) ×2 IMPLANT
PATCH CARTO3 (PAD) ×1 IMPLANT
SHEATH PINNACLE 6F 10CM (SHEATH) ×1 IMPLANT
SHEATH PINNACLE 7F 10CM (SHEATH) ×1 IMPLANT
SHEATH PINNACLE 8F 10CM (SHEATH) ×2 IMPLANT
SHIELD RADPAD SCOOP 12X17 (MISCELLANEOUS) ×1 IMPLANT

## 2019-06-09 NOTE — Progress Notes (Signed)
Patient transferred from cath lab at 1625hrs, oriented to unit and plan of care for shift.  Given post ablation instructions, patient verbalized understanding.

## 2019-06-09 NOTE — Progress Notes (Signed)
Site area: rt groin fa sheath and 3 rt fv sheaths Site Prior to Removal:  Level 0 Pressure Applied For: 35 minutes Manual:   yes Patient Status During Pull:  stable Post Pull Site:  Level 0 Post Pull Instructions Given:  yes Post Pull Pulses Present: rt dp palpable Dressing Applied:  Gauze and tegaderm Bedrest begins @ 1600 Comments: IV saline locked

## 2019-06-09 NOTE — Interval H&P Note (Signed)
History and Physical Interval Note:  06/09/2019 10:17 AM  Jorge [name]  has presented today for surgery, with the diagnosis of pvc.  The various methods of treatment have been discussed with the patient and family. After consideration of risks, benefits and other options for treatment, the patient has consented to  Procedure(s): PVC ABLATION (N/A) as a surgical intervention.  The patient's history has been reviewed, patient examined, no change in status, stable for surgery.  I have reviewed the patient's chart and labs.  Questions were answered to the patient's satisfaction.     [name]

## 2019-06-10 DIAGNOSIS — E785 Hyperlipidemia, unspecified: Secondary | ICD-10-CM | POA: Diagnosis not present

## 2019-06-10 DIAGNOSIS — I493 Ventricular premature depolarization: Secondary | ICD-10-CM | POA: Diagnosis not present

## 2019-06-10 DIAGNOSIS — Z7982 Long term (current) use of aspirin: Secondary | ICD-10-CM | POA: Diagnosis not present

## 2019-06-10 DIAGNOSIS — Z87891 Personal history of nicotine dependence: Secondary | ICD-10-CM | POA: Diagnosis not present

## 2019-06-10 DIAGNOSIS — Z888 Allergy status to other drugs, medicaments and biological substances status: Secondary | ICD-10-CM | POA: Diagnosis not present

## 2019-06-10 DIAGNOSIS — Z79899 Other long term (current) drug therapy: Secondary | ICD-10-CM | POA: Diagnosis not present

## 2019-06-10 DIAGNOSIS — M1990 Unspecified osteoarthritis, unspecified site: Secondary | ICD-10-CM | POA: Diagnosis not present

## 2019-06-10 DIAGNOSIS — I2510 Atherosclerotic heart disease of native coronary artery without angina pectoris: Secondary | ICD-10-CM | POA: Diagnosis not present

## 2019-06-10 MED FILL — Lidocaine HCl Local Preservative Free (PF) Inj 1%: INTRAMUSCULAR | Qty: 30 | Status: AC

## 2019-06-10 NOTE — Discharge Instructions (Signed)

## 2019-06-10 NOTE — Discharge Summary (Addendum)
ELECTROPHYSIOLOGY PROCEDURE DISCHARGE SUMMARY    Patient ID: Jorge [name],  MRN: [id number], DOB/AGE: 19-Sep-1945 74 y.o.  Admit date: 06/09/2019 Discharge date: 06/10/2019  Primary Care Physician: Deland Pretty, MD  Primary Cardiologist: No primary care provider on file.  Electrophysiologist: Dr. Lovena Le  Primary Discharge Diagnosis:  PVCs  Secondary Discharge Diagnosis:  LV dysfunction CAD  Procedures This Admission:  1.  Electrophysiology study and radiofrequency catheter ablation of  PVCs  on 06/09/2019 by Dr. Lovena Le.   This study demonstrated: 1. Baseline ECG - NSR with PVC's 2. Baseline intervals _ the sinus node cycle lenth was 720, The QRS was 88, The HV was 44 and the AH was 112. Following ablation there was no change in the intevals.  3. RVP - RV pacing was carried out from the RV inflow demonstrating VA block at 320 ms.  4. VEST - VEST was carried out from the RV inflow at 500/400, and decreased down to 323ms and there was no sustained VT. 5. RAP - RAP was carried out from 500 ms down to 300 ms and AV block was observed. There was no SVT. 6. AEST - Programmed atrial stimulaton was carried out from the CS and decreased down to 300 ms and there was no SVT. 7. 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 8. 3D mapping - 3D electroanatomical mapping was carried out demonstrating the earliest local ventricular electrogram in the RVOT. However, RF applied to this region resulted in term of the PVC's but they would always return in less than a minute. RF applied to the aortic root demonstrated termination of the PVC's. He was observed for 30 minutes with no recurrnet PVC's.  Estimated blood loss between 50 mL and 150 mL.   Brief HPI: Jorge [name] is a 74 y.o. male with a history of PVCs with LV dysfunction. Risks, benefits, and alternatives to catheter ablation of  PVCs  were reviewed with the patient who wished to proceed.    Hospital Course:  The patient was admitted and underwent EPS/RFCA of  PVCs  with details as outlined above.  They were monitored on telemetry overnight which did show occasional PVCs.  Groin was without complication on the day of discharge.  The patient was examined and considered to be stable for discharge.  Wound care and restrictions were reviewed with the patient.  The patient will be seen back by Dr. Lovena Le in 4 weeks.   Physical Exam: Vitals:   06/09/19 1930 06/09/19 1954 06/09/19 2359 06/10/19 0520  BP:  126/76 128/88 120/73  Pulse: 90 74 88 90  Resp:  20 20 18   Temp:  98.1 F (36.7 C) 98.4 F (36.9 C) 98.1 F (36.7 C)  TempSrc:  Oral Oral Oral  SpO2: 95% 98% 98% 100%  Weight:    101.2 kg  Height:        GEN- The patient is well appearing, alert and oriented x 3 today.   HEENT: normocephalic, atraumatic; sclera clear, conjunctiva pink; hearing intact; oropharynx clear; neck supple  Lungs- Clear to ausculation bilaterally, normal work of breathing.  No wheezes, rales, rhonchi Heart- Regular rate and rhythm, no murmurs, rubs or gallops  GI- soft, non-tender, non-distended, bowel sounds present  Extremities- no clubbing, cyanosis, or edema; DP/PT/radial pulses 2+ bilaterally, groin without hematoma/bruit MS- no significant deformity or atrophy Skin- warm and dry, no rash or lesion Psych- euthymic mood, full affect Neuro- strength and sensation are intact   Labs:  Lab Results  Component Value Date   WBC 5.9 06/05/2019   HGB 15.1 06/05/2019   HCT 42.4 06/05/2019   MCV 91 06/05/2019   PLT 204 06/05/2019    Recent Labs  Lab 06/05/19 1126  NA 142  K 4.5  CL 103  CO2 26  BUN 19  CREATININE 1.17  CALCIUM 9.3  GLUCOSE 133*     Discharge Medications:  Allergies as of 06/10/2019       Reactions   Niacin Itching, Swelling        Medication List     TAKE these medications    amoxicillin 500 MG capsule Commonly known as: AMOXIL Take 1 capsule by mouth  every 6 (six) hours.   aspirin EC 81 MG tablet Take 81 mg by mouth daily.   atorvastatin 80 MG tablet Commonly known as: LIPITOR Take 40 mg by mouth daily.   ezetimibe 10 MG tablet Commonly known as: ZETIA Take 10 mg by mouth daily with breakfast.   fish oil-omega-3 fatty acids 1000 MG capsule Take 2 g by mouth daily with breakfast.   metoprolol succinate 25 MG 24 hr tablet Commonly known as: TOPROL-XL TAKE 1/2 TABLET BY MOUTH EVERY DAY WITH BREAKFAST What changed: See the new instructions.   multivitamin capsule Take 1 capsule by mouth daily.   Myrbetriq 50 MG Tb24 tablet Generic drug: mirabegron ER Take 50 mg by mouth daily.   pantoprazole 40 MG tablet Commonly known as: PROTONIX Take 1 tablet (40 mg total) by mouth daily.   Xeljanz 5 MG Tabs Generic drug: Tofacitinib Citrate Take 5 mg by mouth daily.        Disposition:    Follow-up Information     Evans Lance, MD Follow up on 07/07/2019.   Specialty: Cardiology Why: at 1045 for post ablation follow up Contact information: [address] [address] [phone]            Duration of Discharge Encounter: Greater than 30 minutes including physician time.  Jacalyn Lefevre, PA-C  06/10/2019 7:43 AM  EP Attending  Patient seen and examined. Agree with above. He has done well overnight and is stable for DC home. He had 2-3 PVC's all night rather than trigeminy. He will continue his current meds.  Mikle Bosworth.D.

## 2019-06-15 DIAGNOSIS — Z0120 Encounter for dental examination and cleaning without abnormal findings: Secondary | ICD-10-CM | POA: Diagnosis not present

## 2019-06-16 ENCOUNTER — Telehealth: Payer: Self-pay | Admitting: Internal Medicine

## 2019-06-16 NOTE — Telephone Encounter (Signed)
I spoke with patient. He had ablation on 3/23. Bruising present from groin to knee after procedure. Patient reports pain in leg started on 3/25. It is located on the inside of his right thigh. Bruising improving. Ablation site in groin looks good and is not painful. Pain in thigh is "cramp like". Patient notices pain when sitting and getting in and out of truck. Foot is normal in color. No problems with movement of toes or leg.  He has been taking tylenol in the AM and PM with slight relief. I advised patient he could continue to take tylenol and I would send message to Dr Lovena Le and we would call him back if Dr Lovena Le felt testing was indicated.

## 2019-06-16 NOTE — Telephone Encounter (Signed)
Left message to call office

## 2019-06-16 NOTE — Telephone Encounter (Signed)
Continue tylenol and keep leg elevated as much as possible.

## 2019-06-16 NOTE — Telephone Encounter (Signed)
Follow up  Pt returning call, transferred call

## 2019-06-16 NOTE — Telephone Encounter (Signed)
New message:   Patient calling concering a surgery he had  done last week and the inside his leg is very soar and it hurts him to get up. The pain started late Saturday night.

## 2019-06-17 NOTE — Telephone Encounter (Signed)
Call placed to Pt.  Advised he could increase the amount of tylenol he was taking.  Advised he could take up to 4000 mg daily, advised he try to take 3 doses during the day (at least) and advised to elevate leg as much as possible.  Pt indicates understanding and he was notified to call if the pain got any worse than it is now.

## 2019-06-22 ENCOUNTER — Telehealth: Payer: Self-pay | Admitting: Internal Medicine

## 2019-06-22 NOTE — Telephone Encounter (Signed)
Wife of the patient called. The patient has been having some bruising in his R leg and down his thigh. The wife says he has been limping when he gets up to walk. The wife is concerned of a possible blood clot and she would like to get him checked out sooner than his f/u on 07-07-19. Please advise

## 2019-06-22 NOTE — Telephone Encounter (Signed)
Moved appt up to June 24, 2019 to alleviate family's fears regarding Pt's healing process s/p ablation.  Family indicates understanding of new appt date/time.

## 2019-06-24 ENCOUNTER — Ambulatory Visit (HOSPITAL_COMMUNITY)
Admission: RE | Admit: 2019-06-24 | Discharge: 2019-06-24 | Disposition: A | Discharge status: Home / Self Care | Payer: Medicare Other | Source: Ambulatory Visit | Attending: Cardiology | Admitting: Cardiology

## 2019-06-24 ENCOUNTER — Ambulatory Visit: Payer: Medicare Other | Admitting: Internal Medicine

## 2019-06-24 ENCOUNTER — Encounter: Payer: Self-pay | Admitting: Internal Medicine

## 2019-06-24 ENCOUNTER — Other Ambulatory Visit: Payer: Self-pay

## 2019-06-24 VITALS — BP 138/72 | HR 56 | Ht 70.0 in | Wt 218.0 lb

## 2019-06-24 DIAGNOSIS — I824Y1 Acute embolism and thrombosis of unspecified deep veins of right proximal lower extremity: Secondary | ICD-10-CM | POA: Diagnosis not present

## 2019-06-24 DIAGNOSIS — I493 Ventricular premature depolarization: Secondary | ICD-10-CM | POA: Diagnosis not present

## 2019-06-24 NOTE — Patient Instructions (Signed)
Medication Instructions:  Your physician recommends that you continue on your current medications as directed. Please refer to the Current Medication list given to you today.  Labwork: None ordered.  Testing/Procedures: . Please schedule for STAT ultrasound of right lower extremity  Follow-Up: Based on results of your ultrasound  Any Other Special Instructions Will Be Listed Below (If Applicable).  If you need a refill on your cardiac medications before your next appointment, please call your pharmacy.

## 2019-06-24 NOTE — Progress Notes (Signed)
HPI [name] returns today for followup. He is a pleasant 74 yo man with a h/o PVC induced CM, who was refractory to medical therapy and underwent catheter ablation a couple of weeks ago. He has done well from the PVC perspective. However he noted a large bruise a couple of days after the procedure. His right leg is sore. The bruising has gone down his leg.  He denies chest pain or sob. Allergies  Allergen Reactions  . Niacin Itching and Swelling     Current Outpatient Medications  Medication Sig Dispense Refill  . amoxicillin (AMOXIL) 500 MG capsule Take 1 capsule by mouth every 6 (six) hours.    Marland Kitchen aspirin EC 81 MG tablet Take 81 mg by mouth daily.    Marland Kitchen atorvastatin (LIPITOR) 80 MG tablet Take 40 mg by mouth daily.     Marland Kitchen ezetimibe (ZETIA) 10 MG tablet Take 10 mg by mouth daily with breakfast.    . fish oil-omega-3 fatty acids 1000 MG capsule Take 2 g by mouth daily with breakfast.     . metoprolol succinate (TOPROL-XL) 25 MG 24 hr tablet TAKE 1/2 TABLET BY MOUTH EVERY DAY WITH BREAKFAST 45 tablet 3  . Multiple Vitamin (MULTIVITAMIN) capsule Take 1 capsule by mouth daily.      Marland Kitchen MYRBETRIQ 50 MG TB24 tablet Take 50 mg by mouth daily.    . pantoprazole (PROTONIX) 40 MG tablet Take 1 tablet (40 mg total) by mouth daily. 30 tablet 1  . Tofacitinib Citrate (XELJANZ) 5 MG TABS Take 5 mg by mouth daily.     No current facility-administered medications for this visit.     Past Medical History:  Diagnosis Date  . Arthritis   . CAD (coronary artery disease)    PTCA diagonal 2001, residual 60% LAD /  Nuclear 2006, no ischemia  . Carotid artery disease ([place])    Doppler, October, 2012, 0-39% bilateral stenoses.  . Diverticulitis   . Dyslipidemia   . Ejection fraction    EF normal,  . Eye abnormalities 08-23-2011   right ryr injection for bleed done by dr Zadie Rhine, dr Zadie Rhine told pt ok to have surgery 09-03-2011  . Family history of anesthesia complication    SISTER HAD MEMORY  PROBLEMS POST OP  . Preop cardiovascular exam    Patient needs cardiac clearance for hernia surgery May, 2013  . Psoriatic arthritis ([place])    2012  . PVC's (premature ventricular contractions)    Frequent PVCs noted over the years., These always decrease with exercise. This is documented  . Umbilical hernia [id number]    ROS:   All systems reviewed and negative except as noted in the HPI.   Past Surgical History:  Procedure Laterality Date  . ABLATION  06/09/2019  . ANGIOPLASTY  12/1999 and 03/2000  . CARDIAC CATHETERIZATION  2001  . carpel tunnel  2009/2010   both wrists  . COLONOSCOPY N/A 10/30/2013   Procedure: COLONOSCOPY;  Surgeon: Beryle Beams, MD;  Location: [facility];  Service: Endoscopy;  Laterality: N/A;  . ESOPHAGOGASTRODUODENOSCOPY N/A 10/30/2013   Procedure: ESOPHAGOGASTRODUODENOSCOPY (EGD);  Surgeon: Beryle Beams, MD;  Location: [facility] ENDOSCOPY;  Service: Endoscopy;  Laterality: N/A;  . INGUINAL HERNIA REPAIR  09/03/2011   Procedure: HERNIA REPAIR INGUINAL ADULT;  Surgeon: Imogene Burn. Georgette Dover, MD;  Location: [facility];  Service: General;  Laterality: Left;  Left Ingunal Hernia Repair with Mesh  . PVC ABLATION N/A 06/09/2019   Procedure: PVC ABLATION;  Surgeon: Evans Lance, MD;  Location: [place];  Service: Cardiovascular;  Laterality: N/A;  . right knee meniscus repair  1990's  . UMBILICAL HERNIA REPAIR  09/03/2011   Procedure: HERNIA REPAIR UMBILICAL ADULT;  Surgeon: Imogene Burn. Georgette Dover, MD;  Location: [facility];  Service: General;  Laterality: N/A;     Family History  Problem Relation Age of Onset  . Cancer Mother        uterine  . Cancer Sister        Breast  . Crohn's disease Cousin        distant  . Colon cancer Neg Hx   . Ulcerative colitis Neg Hx      Social History   Socioeconomic History  . Marital status: Married    Spouse name: Not on file  . Number of children: 1  . Years of education: Not on file  . Highest education level: Some  college, no degree  Occupational History  . Not on file  Tobacco Use  . Smoking status: Former Smoker    Packs/day: 1.50    Years: 25.00    Pack years: 37.50    Types: Cigarettes    Quit date: 12/08/1994    Years since quitting: 24.5  . Smokeless tobacco: Former Network engineer and Sexual Activity  . Alcohol use: Yes    Comment: 2 drink per day  . Drug use: No  . Sexual activity: Not on file  Other Topics Concern  . Not on file  Social History Narrative   Does not use cane or walker, independent of IDLs   Right handed    Caffeine 1 cup daily    Lives at home with spouse    Social Determinants of Health   Financial Resource Strain:   . Difficulty of Paying Living Expenses:   Food Insecurity:   . Worried About Charity fundraiser in the Last Year:   . Arboriculturist in the Last Year:   Transportation Needs:   . Film/video editor (Medical):   Marland Kitchen Lack of Transportation (Non-Medical):   Physical Activity:   . Days of Exercise per Week:   . Minutes of Exercise per Session:   Stress:   . Feeling of Stress :   Social Connections:   . Frequency of Communication with Friends and Family:   . Frequency of Social Gatherings with Friends and Family:   . Attends Religious Services:   . Active Member of Clubs or Organizations:   . Attends Archivist Meetings:   Marland Kitchen Marital Status:   Intimate Partner Violence:   . Fear of Current or Ex-Partner:   . Emotionally Abused:   Marland Kitchen Physically Abused:   . Sexually Abused:      BP 138/72   Pulse (!) 56   Ht 5\' 10"  (1.778 m)   Wt 218 lb (98.9 kg)   SpO2 96%   BMI 31.28 kg/m   Physical Exam:  Well appearing NAD HEENT: Unremarkable Neck:  No JVD, no thyromegally Lymphatics:  No adenopathy Back:  No CVA tenderness Lungs:  No increased work of breathing HEART:  Regular rate rhythm, no murmurs, no rubs, no clicks Abd:  soft, positive bowel sounds, no organomegally, no rebound, no guarding Ext:  Large area of  ecchymosis right thigh, tender to palpation Skin:  No rashes no nodules Neuro:  CN II through XII intact, motor grossly intact  EKG - nsr with no PVC's  Assess/Plan: 1.  PVC"s - he is s/p ablation and his PVC's appear to be gone.  2. Right leg ecchymosis - his leg is tender and I am concerned about the possibility of a DVT. I suspect however that he has a collection of blood under the skin. I will ask him to obtain an ultrasound. If no DVT, then he will undergo watchful waiting.  Mikle Bosworth.D.

## 2019-06-25 ENCOUNTER — Telehealth: Payer: Self-pay

## 2019-06-25 NOTE — Telephone Encounter (Signed)
Called Pt with results of ultrasound.  Rescheduled Pt f/u for April 20, 21 with GT at 8:45 am.  Pt aware.

## 2019-06-25 NOTE — Telephone Encounter (Signed)
-----   Message from Evans Lance, MD sent at 06/24/2019  4:50 PM EDT ----- Let him know that he does not have a blood clot in his vein. He does have a hematoma that will be sore for a couple of weeks and will track down to the lower leg. She heal up over time. Tylenol for pain. Keep leg elevated.

## 2019-07-07 ENCOUNTER — Ambulatory Visit: Payer: Medicare Other | Admitting: Internal Medicine

## 2019-07-07 ENCOUNTER — Encounter: Payer: Self-pay | Admitting: Internal Medicine

## 2019-07-07 ENCOUNTER — Other Ambulatory Visit: Payer: Self-pay

## 2019-07-07 VITALS — BP 128/80 | HR 80 | Ht 70.0 in | Wt 225.6 lb

## 2019-07-07 DIAGNOSIS — I2510 Atherosclerotic heart disease of native coronary artery without angina pectoris: Secondary | ICD-10-CM

## 2019-07-07 DIAGNOSIS — I493 Ventricular premature depolarization: Secondary | ICD-10-CM

## 2019-07-07 NOTE — Progress Notes (Signed)
HPI Jorge [name] returns today for followup. He underwent PVC ablation several weeks ago and his procedure was complicated by a groin hematoma that migrateded to his thigh. He underwent u/s which demonstrated no DVT. In the interim, he notes his leg has improved. He denies any pain or tenderness. No fever or chills.  Allergies  Allergen Reactions  . Niacin Itching and Swelling     Current Outpatient Medications  Medication Sig Dispense Refill  . amoxicillin (AMOXIL) 500 MG capsule Take 1 capsule by mouth every 6 (six) hours.    Marland Kitchen aspirin EC 81 MG tablet Take 81 mg by mouth daily.    Marland Kitchen atorvastatin (LIPITOR) 80 MG tablet Take 40 mg by mouth daily.     Marland Kitchen ezetimibe (ZETIA) 10 MG tablet Take 10 mg by mouth daily with breakfast.    . fish oil-omega-3 fatty acids 1000 MG capsule Take 2 g by mouth daily with breakfast.     . metoprolol succinate (TOPROL-XL) 25 MG 24 hr tablet TAKE 1/2 TABLET BY MOUTH EVERY DAY WITH BREAKFAST 45 tablet 3  . Multiple Vitamin (MULTIVITAMIN) capsule Take 1 capsule by mouth daily.      Marland Kitchen MYRBETRIQ 50 MG TB24 tablet Take 50 mg by mouth daily.    . pantoprazole (PROTONIX) 40 MG tablet Take 1 tablet (40 mg total) by mouth daily. 30 tablet 1  . Tofacitinib Citrate (XELJANZ) 5 MG TABS Take 5 mg by mouth daily.     No current facility-administered medications for this visit.     Past Medical History:  Diagnosis Date  . Arthritis   . CAD (coronary artery disease)    PTCA diagonal 2001, residual 60% LAD /  Nuclear 2006, no ischemia  . Carotid artery disease ([place])    Doppler, October, 2012, 0-39% bilateral stenoses.  . Diverticulitis   . Dyslipidemia   . Ejection fraction    EF normal,  . Eye abnormalities 08-23-2011   right ryr injection for bleed done by dr Zadie Rhine, dr Zadie Rhine told pt ok to have surgery 09-03-2011  . Family history of anesthesia complication    SISTER HAD MEMORY PROBLEMS POST OP  . Preop cardiovascular exam    Patient needs cardiac  clearance for hernia surgery May, 2013  . Psoriatic arthritis ([place])    2012  . PVC's (premature ventricular contractions)    Frequent PVCs noted over the years., These always decrease with exercise. This is documented  . Umbilical hernia [id number]    ROS:   All systems reviewed and negative except as noted in the HPI.   Past Surgical History:  Procedure Laterality Date  . ABLATION  06/09/2019  . ANGIOPLASTY  12/1999 and 03/2000  . CARDIAC CATHETERIZATION  2001  . carpel tunnel  2009/2010   both wrists  . COLONOSCOPY N/A 10/30/2013   Procedure: COLONOSCOPY;  Surgeon: Beryle Beams, MD;  Location: [place];  Service: Endoscopy;  Laterality: N/A;  . ESOPHAGOGASTRODUODENOSCOPY N/A 10/30/2013   Procedure: ESOPHAGOGASTRODUODENOSCOPY (EGD);  Surgeon: Beryle Beams, MD;  [facility] END[facility];  Service: Endoscopy;  Laterality: N/A;  . INGUINAL HERNIA REPAIR  09/03/2011   Procedure: HERNIA REPAIR INGUINAL ADULT;  Surgeon: Imogene Burn. Georgette Dover, MD;  Location: [facility];  Service: General;  Laterality: Left;  Left Ingunal Hernia Repair with Mesh  . PVC ABLATION N/A 06/09/2019   Procedure: PVC ABLATION;  Surgeon: Evans Lance, MD;  Location: [place] [facility];  Service: Cardiovascular;  Laterality: N/A;  .  right knee meniscus repair  1990's  . UMBILICAL HERNIA REPAIR  09/03/2011   Procedure: HERNIA REPAIR UMBILICAL ADULT;  Surgeon: Imogene Burn. Georgette Dover, MD;  Location: [facility];  Service: General;  Laterality: N/A;     Family History  Problem Relation Age of Onset  . Cancer Mother        uterine  . Cancer Sister        Breast  . Crohn's disease Cousin        distant  . Colon cancer Neg Hx   . Ulcerative colitis Neg Hx      Social History   Socioeconomic History  . Marital status: Married    Spouse name: Not on file  . Number of children: 1  . Years of education: Not on file  . Highest education level: Some college, no degree  Occupational History  . Not on file  Tobacco Use  .  Smoking status: Former Smoker    Packs/day: 1.50    Years: 25.00    Pack years: 37.50    Types: Cigarettes    Quit date: 12/08/1994    Years since quitting: 24.5  . Smokeless tobacco: Former Network engineer and Sexual Activity  . Alcohol use: Yes    Comment: 2 drink per day  . Drug use: No  . Sexual activity: Not on file  Other Topics Concern  . Not on file  Social History Narrative   Does not use cane or walker, independent of IDLs   Right handed    Caffeine 1 cup daily    Lives at home with spouse    Social Determinants of Health   Financial Resource Strain:   . Difficulty of Paying Living Expenses:   Food Insecurity:   . Worried About Charity fundraiser in the Last Year:   . Arboriculturist in the Last Year:   Transportation Needs:   . Film/video editor (Medical):   Marland Kitchen Lack of Transportation (Non-Medical):   Physical Activity:   . Days of Exercise per Week:   . Minutes of Exercise per Session:   Stress:   . Feeling of Stress :   Social Connections:   . Frequency of Communication with Friends and Family:   . Frequency of Social Gatherings with Friends and Family:   . Attends Religious Services:   . Active Member of Clubs or Organizations:   . Attends Archivist Meetings:   Marland Kitchen Marital Status:   Intimate Partner Violence:   . Fear of Current or Ex-Partner:   . Emotionally Abused:   Marland Kitchen Physically Abused:   . Sexually Abused:      BP 128/80   Pulse 80   Ht 5\' 10"  (1.778 m)   Wt 225 lb 9.6 oz (102.3 kg)   SpO2 97%   BMI 32.37 kg/m   Physical Exam:  Well appearing NAD HEENT: Unremarkable Neck:  No JVD, no thyromegally Lymphatics:  No adenopathy Back:  No CVA tenderness Lungs:  No increased work of breathing. HEART:  Regular rate rhythm, no murmurs, no rubs, no clicks Abd:  soft, positive bowel sounds, no organomegally, no rebound, no guarding Ext:  2 plus pulses, no edema, no cyanosis, no clubbing; non-tender Skin:  No rashes no  nodules Neuro:  CN II through XII intact, motor grossly intact  Assess/Plan: 1. PVC's - he is s/p ablation and appears to be doing well. We will have him wear a 3 day zio patch.  2.  Right leg hematoma - his symptoms are much improved.  3. CAD  - he remains active and denies anginal symptoms.   Mikle Bosworth.D.

## 2019-07-07 NOTE — Patient Instructions (Addendum)
Medication Instructions:  Your physician recommends that you continue on your current medications as directed. Please refer to the Current Medication list given to you today.  Labwork: None ordered.  Testing/Procedures: Your physician has recommended that you wear a holter monitor. Holter monitors are medical devices that record the heart's electrical activity. Doctors most often use these monitors to diagnose arrhythmias. Arrhythmias are problems with the speed or rhythm of the heartbeat. The monitor is a small, portable device. You can wear one while you do your normal daily activities. This is usually used to diagnose what is causing palpitations/syncope (passing out).  You will wear a 3 day monitor in one month  Follow-Up: Your physician wants you to follow-up in: follow up based on results with Dr. Lovena Le.      Any Other Special Instructions Will Be Listed Below (If Applicable).  If you need a refill on your cardiac medications before your next appointment, please call your pharmacy.

## 2019-07-14 ENCOUNTER — Encounter: Payer: Self-pay | Admitting: *Deleted

## 2019-07-14 NOTE — Progress Notes (Signed)
Patient ID: Jorge [name], male   DOB: 1945-08-11, 74 y.o.   MRN: [id number] Patient enrolled for [employer] to ship a 3 day ZIO XT long term holter to his home.  To be applied 08/06/2019.  Instructions mailed to patient.

## 2019-08-07 ENCOUNTER — Ambulatory Visit (INDEPENDENT_AMBULATORY_CARE_PROVIDER_SITE_OTHER): Payer: Medicare Other

## 2019-08-07 DIAGNOSIS — I493 Ventricular premature depolarization: Secondary | ICD-10-CM

## 2019-08-19 DIAGNOSIS — M5030 Other cervical disc degeneration, unspecified cervical region: Secondary | ICD-10-CM | POA: Diagnosis not present

## 2019-08-19 DIAGNOSIS — M549 Dorsalgia, unspecified: Secondary | ICD-10-CM | POA: Diagnosis not present

## 2019-08-19 DIAGNOSIS — M1990 Unspecified osteoarthritis, unspecified site: Secondary | ICD-10-CM | POA: Diagnosis not present

## 2019-08-19 DIAGNOSIS — M79641 Pain in right hand: Secondary | ICD-10-CM | POA: Diagnosis not present

## 2019-08-19 DIAGNOSIS — M79642 Pain in left hand: Secondary | ICD-10-CM | POA: Diagnosis not present

## 2019-08-19 DIAGNOSIS — M0609 Rheumatoid arthritis without rheumatoid factor, multiple sites: Secondary | ICD-10-CM | POA: Diagnosis not present

## 2019-08-19 DIAGNOSIS — Z79899 Other long term (current) drug therapy: Secondary | ICD-10-CM | POA: Diagnosis not present

## 2019-09-05 DIAGNOSIS — I493 Ventricular premature depolarization: Secondary | ICD-10-CM | POA: Diagnosis not present

## 2019-09-23 ENCOUNTER — Encounter (INDEPENDENT_AMBULATORY_CARE_PROVIDER_SITE_OTHER): Payer: Self-pay | Admitting: Ophthalmology

## 2019-09-23 ENCOUNTER — Encounter (INDEPENDENT_AMBULATORY_CARE_PROVIDER_SITE_OTHER): Payer: Medicare Other | Admitting: Ophthalmology

## 2019-09-23 ENCOUNTER — Other Ambulatory Visit: Payer: Self-pay

## 2019-09-23 ENCOUNTER — Ambulatory Visit (INDEPENDENT_AMBULATORY_CARE_PROVIDER_SITE_OTHER): Payer: Medicare Other | Admitting: Ophthalmology

## 2019-09-23 DIAGNOSIS — H35351 Cystoid macular degeneration, right eye: Secondary | ICD-10-CM | POA: Diagnosis not present

## 2019-09-23 DIAGNOSIS — H2512 Age-related nuclear cataract, left eye: Secondary | ICD-10-CM

## 2019-09-23 DIAGNOSIS — H34811 Central retinal vein occlusion, right eye, with macular edema: Secondary | ICD-10-CM

## 2019-09-23 DIAGNOSIS — H2513 Age-related nuclear cataract, bilateral: Secondary | ICD-10-CM

## 2019-09-23 HISTORY — DX: Age-related nuclear cataract, left eye: H25.12

## 2019-09-23 MED ORDER — BEVACIZUMAB CHEMO INJECTION 1.25MG/0.05ML SYRINGE FOR KALEIDOSCOPE
1.2500 mg | INTRAVITREAL | Status: AC | PRN
  Administered 2019-09-23: 1.25 mg via INTRAVITREAL

## 2019-09-23 NOTE — Progress Notes (Signed)
09/23/2019     CHIEF COMPLAINT Patient presents for Retina Follow Up   HISTORY OF PRESENT ILLNESS: Jorge [name] is a 74 y.o. male who presents to the clinic today for:   HPI    Retina Follow Up    Patient presents with  CRVO/BRVO.  In right eye.  Duration of 4 months.  Since onset it is stable.          Comments    4 month f.u - OCT OU, Poss Avastin OD Patient denies change in vision and overall has no complaints.        Last edited by Gerda Diss on 09/23/2019 11:00 AM. (History)      Referring physician: Deland Pretty, MD [address] [place],  [place] [id number]  HISTORICAL INFORMATION:   Selected notes from the MEDICAL RECORD NUMBER    Lab Results  Component Value Date   HGBA1C 5.7 (H) 10/29/2013     CURRENT MEDICATIONS: No current outpatient medications on file. (Ophthalmic Drugs)   No current facility-administered medications for this visit. (Ophthalmic Drugs)   Current Outpatient Medications (Other)  Medication Sig  . amoxicillin (AMOXIL) 500 MG capsule Take 1 capsule by mouth every 6 (six) hours.  Marland Kitchen aspirin EC 81 MG tablet Take 81 mg by mouth daily.  Marland Kitchen atorvastatin (LIPITOR) 80 MG tablet Take 40 mg by mouth daily.   Marland Kitchen ezetimibe (ZETIA) 10 MG tablet Take 10 mg by mouth daily with breakfast.  . fish oil-omega-3 fatty acids 1000 MG capsule Take 2 g by mouth daily with breakfast.   . metoprolol succinate (TOPROL-XL) 25 MG 24 hr tablet TAKE 1/2 TABLET BY MOUTH EVERY DAY WITH BREAKFAST  . Multiple Vitamin (MULTIVITAMIN) capsule Take 1 capsule by mouth daily.    Marland Kitchen MYRBETRIQ 50 MG TB24 tablet Take 50 mg by mouth daily.  . pantoprazole (PROTONIX) 40 MG tablet Take 1 tablet (40 mg total) by mouth daily.  . Tofacitinib Citrate (XELJANZ) 5 MG TABS Take 5 mg by mouth daily.   No current facility-administered medications for this visit. (Other)      REVIEW OF SYSTEMS:    ALLERGIES Allergies  Allergen Reactions  . Niacin Itching and  Swelling    PAST MEDICAL HISTORY Past Medical History:  Diagnosis Date  . Arthritis   . CAD (coronary artery disease)    PTCA diagonal 2001, residual 60% LAD /  Nuclear 2006, no ischemia  . Carotid artery disease ([place])    Doppler, October, 2012, 0-39% bilateral stenoses.  . Diverticulitis   . Dyslipidemia   . Ejection fraction    EF normal,  . Eye abnormalities 08-23-2011   right ryr injection for bleed done by dr Zadie Rhine, dr Zadie Rhine told pt ok to have surgery 09-03-2011  . Family history of anesthesia complication    SISTER HAD MEMORY PROBLEMS POST OP  . Preop cardiovascular exam    Patient needs cardiac clearance for hernia surgery May, 2013  . Psoriatic arthritis ([place])    2012  . PVC's (premature ventricular contractions)    Frequent PVCs noted over the years., These always decrease with exercise. This is documented  . Umbilical hernia 0/17/5102   Past Surgical History:  Procedure Laterality Date  . ABLATION  06/09/2019  . ANGIOPLASTY  12/1999 and 03/2000  . CARDIAC CATHETERIZATION  2001  . carpel tunnel  2009/2010   both wrists  . COLONOSCOPY N/A 10/30/2013   Procedure: COLONOSCOPY;  Surgeon: Beryle Beams, MD;  Location:  [place];  Service: Endoscopy;  Laterality: N/A;  . ESOPHAGOGASTRODUODENOSCOPY N/A 10/30/2013   Procedure: ESOPHAGOGASTRODUODENOSCOPY (EGD);  Surgeon: Beryle Beams, MD;  Location: [facility] ENDOSCOPY;  Service: Endoscopy;  Laterality: N/A;  . INGUINAL HERNIA REPAIR  09/03/2011   Procedure: HERNIA REPAIR INGUINAL ADULT;  Surgeon: Imogene Burn. Georgette Dover, MD;  Location: [facility];  Service: General;  Laterality: Left;  Left Ingunal Hernia Repair with Mesh  . PVC ABLATION N/A 06/09/2019   Procedure: PVC ABLATION;  Surgeon: Evans Lance, MD;  Location: [place] [facility];  Service: Cardiovascular;  Laterality: N/A;  . right knee meniscus repair  1990's  . UMBILICAL HERNIA REPAIR  09/03/2011   Procedure: HERNIA REPAIR UMBILICAL ADULT;  Surgeon: Imogene Burn. Georgette Dover, MD;   Location: [facility];  Service: General;  Laterality: N/A;    FAMILY HISTORY Family History  Problem Relation Age of Onset  . Cancer Mother        uterine  . Cancer Sister        Breast  . Crohn's disease Cousin        distant  . Colon cancer Neg Hx   . Ulcerative colitis Neg Hx     SOCIAL HISTORY Social History   Tobacco Use  . Smoking status: Former Smoker    Packs/day: 1.50    Years: 25.00    Pack years: 37.50    Types: Cigarettes    Quit date: 12/08/1994    Years since quitting: 24.8  . Smokeless tobacco: Former Network engineer  . Vaping Use: Never used  Substance Use Topics  . Alcohol use: Yes    Comment: 2 drink per day  . Drug use: No         OPHTHALMIC EXAM:  Base Eye Exam    Visual Acuity (Snellen - Linear)      Right Left   Dist cc 20/40+1 20/20-2   Dist ph cc 20/30-2    Correction: Glasses       Tonometry (Tonopen, 11:05 AM)      Right Left   Pressure 21 18       Pupils      Pupils Dark Light Shape React APD   Right PERRL 4 3 Round Slow None   Left PERRL 4 3 Round Slow None       Visual Fields (Counting fingers)      Left Right    Full Full       Extraocular Movement      Right Left    Full Full       Neuro/Psych    Oriented x3: Yes   Mood/Affect: Normal       Dilation    Both eyes: 1.0% Mydriacyl, 2.5% Phenylephrine @ 11:05 AM        Slit Lamp and Fundus Exam    External Exam      Right Left   External Normal Normal       Slit Lamp Exam      Right Left   Lids/Lashes Normal Normal   Conjunctiva/Sclera White and quiet White and quiet   Cornea Clear Clear   Anterior Chamber Deep and quiet Deep and quiet   Iris Round and reactive Round and reactive   Lens 2.5+ Nuclear sclerosis 2+ Nuclear sclerosis   Anterior Vitreous Normal Normal       Fundus Exam      Right Left   Posterior Vitreous Posterior vitreous detachment, Central vitreous floaters Normal   Disc  Collaterals Normal   C/D Ratio 0.55 0.55   Macula Cystoid  macular edema, Microaneurysms Normal   Vessels Normal Normal   Periphery Normal Normal          IMAGING AND PROCEDURES  Imaging and Procedures for 09/23/19  OCT, Retina - OU - Both Eyes       Right Eye Quality was good. Scan locations included subfoveal. Central Foveal Thickness: 306. Progression has improved. Findings include cystoid macular edema.   Left Eye Quality was good. Scan locations included subfoveal. Central Foveal Thickness: 298. Findings include vitreomacular adhesion .   Notes Perifoveal CME OS has improved, currently at 3.11-month interval.  We will repeat intravitreal Avastin today to control CME from longstanding CRVO       Intravitreal Injection, Pharmacologic Agent - OD - Right Eye       Time Out 09/23/2019. 12:05 PM. Confirmed correct patient, procedure, site, and patient consented.   Anesthesia Topical anesthesia was used. Anesthetic medications included Akten 3.5%.   Procedure Preparation included Ofloxacin , 10% betadine to eyelids, 5% betadine to ocular surface. A 30 gauge needle was used.   Injection:  1.25 mg Bevacizumab (AVASTIN) SOLN   NDC: [address]-6, Lot: [id number]   Route: Intravitreal, Site: Right Eye, Waste: 0 mg  Post-op Post injection exam found visual acuity of at least counting fingers. The patient tolerated the procedure well. There were no complications. The patient received written and verbal post procedure care education. Post injection medications were not given.                 ASSESSMENT/PLAN:  Central retinal vein occlusion with macular edema of right eye The nature of central retinal vein occlusion was discussed with the patient including the division of types into nonischemic ischemic. The potential sequelae of ischemic central retinal vein occlusion, including macular edema, neovascularization, rubeosis iridis, and neovascular glaucoma, were discussed, and the need for frequent follow-up.  The nature of macular  edema and central retinal vein occlusion was discussed. The following options were considered:  1.Observation for a period to look for spontaneous improvement, is no linger the primary therapy. One-third worsen, one-third stay unchanged, and one-third improves.  2. Anti-VEGF Therapy. ( Lucentis, Avastin or Eylea ) injected  in intravitreal fashion, initially monthly then tailored to clinical response.  3. Intravitreal steroid usage, Kenalog, or Ozurdex, usually a second line therapy or in combination with anti-Vegf therapy noted above.  4. Panretinal laser photocoagulation to cause regression of iris neovascularization, or treat retinal  non-perfusion.  5. Surgical Management may include vitrectomy with incisions of peripheral veins to trigger retino choroidal anastomosis formation. This topic presented and discussed at [place].   Chronic smoldering CME only recurrence at 3 months in the past.  Small CME remains and is controlled at 2 to 63-month interval examinations we will repeat intravitreal Avastin today      ICD-10-CM   1. Central retinal vein occlusion with macular edema of right eye  H34.8110 OCT, Retina - OU - Both Eyes    Intravitreal Injection, Pharmacologic Agent - OD - Right Eye    Bevacizumab (AVASTIN) SOLN 1.25 mg  2. Cystoid macular edema of right eye  H35.351 OCT, Retina - OU - Both Eyes  3. Nuclear sclerotic cataract of both eyes  H25.13     1.  Repeat intravitreal Avastin OD today at 3.52-month interval.  Repeat examination in 3 months.  2.  Per Dr. Quentin Ore regarding nuclear sclerotic cataract, may proceed with  surgery at any time his symptoms to his activities of daily living.  I explained the effect on nighttime vision and dark rainy days with this form of nuclear sclerosis  3.  Ophthalmic Meds Ordered this visit:  Meds ordered this encounter  Medications  . Bevacizumab (AVASTIN) SOLN 1.25 mg       Return in about 3 months (around 12/24/2019) for DILATE  OU, AVASTIN OCT, OD.  There are no Patient Instructions on file for this visit.   Explained the diagnoses, plan, and follow up with the patient and they expressed understanding.  Patient expressed understanding of the importance of proper follow up care.   Clent Demark Sigmund Morera M.D. Diseases & Surgery of the Retina and Vitreous Retina & Diabetic [place] 09/23/19     Abbreviations: M myopia (nearsighted); A astigmatism; H hyperopia (farsighted); P presbyopia; Mrx spectacle prescription;  CTL contact lenses; OD right eye; OS left eye; OU both eyes  XT exotropia; ET esotropia; PEK punctate epithelial keratitis; PEE punctate epithelial erosions; DES dry eye syndrome; MGD meibomian gland dysfunction; ATs artificial tears; PFAT's preservative free artificial tears; Rives nuclear sclerotic cataract; PSC posterior subcapsular cataract; ERM epi-retinal membrane; PVD posterior vitreous detachment; RD retinal detachment; DM diabetes mellitus; DR diabetic retinopathy; NPDR non-proliferative diabetic retinopathy; PDR proliferative diabetic retinopathy; CSME clinically significant macular edema; DME diabetic macular edema; dbh dot blot hemorrhages; CWS cotton wool spot; POAG primary open angle glaucoma; C/D cup-to-disc ratio; HVF humphrey visual field; GVF goldmann visual field; OCT optical coherence tomography; IOP intraocular pressure; BRVO Branch retinal vein occlusion; CRVO central retinal vein occlusion; CRAO central retinal artery occlusion; BRAO branch retinal artery occlusion; RT retinal tear; SB scleral buckle; PPV pars plana vitrectomy; VH Vitreous hemorrhage; PRP panretinal laser photocoagulation; IVK intravitreal kenalog; VMT vitreomacular traction; MH Macular hole;  NVD neovascularization of the disc; NVE neovascularization elsewhere; AREDS age related eye disease study; ARMD age related macular degeneration; POAG primary open angle glaucoma; EBMD epithelial/anterior basement membrane dystrophy; ACIOL  anterior chamber intraocular lens; IOL intraocular lens; PCIOL posterior chamber intraocular lens; Phaco/IOL phacoemulsification with intraocular lens placement; [place] photorefractive keratectomy; LASIK laser assisted in situ keratomileusis; HTN hypertension; DM diabetes mellitus; COPD chronic obstructive pulmonary disease

## 2019-09-23 NOTE — Assessment & Plan Note (Signed)
The nature of central retinal vein occlusion was discussed with the patient including the division of types into nonischemic ischemic. The potential sequelae of ischemic central retinal vein occlusion, including macular edema, neovascularization, rubeosis iridis, and neovascular glaucoma, were discussed, and the need for frequent follow-up.  The nature of macular edema and central retinal vein occlusion was discussed. The following options were considered:  1.Observation for a period to look for spontaneous improvement, is no linger the primary therapy. One-third worsen, one-third stay unchanged, and one-third improves.  2. Anti-VEGF Therapy. ( Lucentis, Avastin or Eylea ) injected  in intravitreal fashion, initially monthly then tailored to clinical response.  3. Intravitreal steroid usage, Kenalog, or Ozurdex, usually a second line therapy or in combination with anti-Vegf therapy noted above.  4. Panretinal laser photocoagulation to cause regression of iris neovascularization, or treat retinal  non-perfusion.  5. Surgical Management may include vitrectomy with incisions of peripheral veins to trigger retino choroidal anastomosis formation. This topic presented and discussed at [place].   Chronic smoldering CME only recurrence at 3 months in the past.  Small CME remains and is controlled at 2 to 59-month interval examinations we will repeat intravitreal Avastin today

## 2019-10-02 ENCOUNTER — Telehealth: Payer: Self-pay

## 2019-10-02 NOTE — Telephone Encounter (Signed)
Pt wore HM to assess PVC's.  Per Dr. Sherron Ales Pt follow up with him in 3 months.  Will send to scheduling.

## 2019-10-02 NOTE — Telephone Encounter (Signed)
-----   Message from Damian Leavell, RN sent at 07/13/2019  8:07 AM EDT ----- Regarding: Jorge [name]

## 2019-11-19 DIAGNOSIS — M0609 Rheumatoid arthritis without rheumatoid factor, multiple sites: Secondary | ICD-10-CM | POA: Diagnosis not present

## 2019-11-19 DIAGNOSIS — Z79899 Other long term (current) drug therapy: Secondary | ICD-10-CM | POA: Diagnosis not present

## 2019-11-19 DIAGNOSIS — M1990 Unspecified osteoarthritis, unspecified site: Secondary | ICD-10-CM | POA: Diagnosis not present

## 2019-11-19 DIAGNOSIS — M5030 Other cervical disc degeneration, unspecified cervical region: Secondary | ICD-10-CM | POA: Diagnosis not present

## 2019-11-27 DIAGNOSIS — S7001XA Contusion of right hip, initial encounter: Secondary | ICD-10-CM | POA: Diagnosis not present

## 2019-11-27 DIAGNOSIS — M25461 Effusion, right knee: Secondary | ICD-10-CM | POA: Diagnosis not present

## 2019-11-27 DIAGNOSIS — M25532 Pain in left wrist: Secondary | ICD-10-CM | POA: Diagnosis not present

## 2019-11-27 DIAGNOSIS — S60212A Contusion of left wrist, initial encounter: Secondary | ICD-10-CM | POA: Diagnosis not present

## 2019-12-04 DIAGNOSIS — H2513 Age-related nuclear cataract, bilateral: Secondary | ICD-10-CM | POA: Diagnosis not present

## 2019-12-04 DIAGNOSIS — H34811 Central retinal vein occlusion, right eye, with macular edema: Secondary | ICD-10-CM | POA: Diagnosis not present

## 2019-12-04 DIAGNOSIS — H40013 Open angle with borderline findings, low risk, bilateral: Secondary | ICD-10-CM | POA: Diagnosis not present

## 2019-12-04 DIAGNOSIS — H25013 Cortical age-related cataract, bilateral: Secondary | ICD-10-CM | POA: Diagnosis not present

## 2019-12-10 DIAGNOSIS — M79604 Pain in right leg: Secondary | ICD-10-CM | POA: Diagnosis not present

## 2019-12-10 DIAGNOSIS — M25532 Pain in left wrist: Secondary | ICD-10-CM | POA: Diagnosis not present

## 2019-12-24 ENCOUNTER — Other Ambulatory Visit: Payer: Self-pay

## 2019-12-24 ENCOUNTER — Ambulatory Visit (INDEPENDENT_AMBULATORY_CARE_PROVIDER_SITE_OTHER): Payer: Medicare Other | Admitting: Ophthalmology

## 2019-12-24 ENCOUNTER — Encounter (INDEPENDENT_AMBULATORY_CARE_PROVIDER_SITE_OTHER): Payer: Self-pay | Admitting: Ophthalmology

## 2019-12-24 DIAGNOSIS — H35351 Cystoid macular degeneration, right eye: Secondary | ICD-10-CM | POA: Diagnosis not present

## 2019-12-24 DIAGNOSIS — H2513 Age-related nuclear cataract, bilateral: Secondary | ICD-10-CM

## 2019-12-24 DIAGNOSIS — H34811 Central retinal vein occlusion, right eye, with macular edema: Secondary | ICD-10-CM

## 2019-12-24 MED ORDER — BEVACIZUMAB CHEMO INJECTION 1.25MG/0.05ML SYRINGE FOR KALEIDOSCOPE
1.2500 mg | INTRAVITREAL | Status: AC | PRN
  Administered 2019-12-24: 1.25 mg via INTRAVITREAL

## 2019-12-24 NOTE — Assessment & Plan Note (Signed)
The nature of central retinal vein occlusion was discussed with the patient including the division of types into nonischemic ischemic. The potential sequelae of ischemic central retinal vein occlusion, including macular edema, neovascularization, rubeosis iridis, and neovascular glaucoma, were discussed, and the need for frequent follow-up.  The nature of macular edema and central retinal vein occlusion was discussed. The following options were considered:  1.Observation for a period to look for spontaneous improvement, is no linger the primary therapy. One-third worsen, one-third stay unchanged, and one-third improves.  2. Anti-VEGF Therapy. ( Lucentis, Avastin or Eylea ) injected  in intravitreal fashion, initially monthly then tailored to clinical response.  3. Intravitreal steroid usage, Kenalog, or Ozurdex, usually a second line therapy or in combination with anti-Vegf therapy noted above.  4. Panretinal laser photocoagulation to cause regression of iris neovascularization, or treat retinal  non-perfusion.  5. Surgical Management may include vitrectomy with incisions of peripheral veins to trigger retino choroidal anastomosis formation. This topic presented and discussed at [place].

## 2019-12-24 NOTE — Progress Notes (Signed)
12/24/2019     CHIEF COMPLAINT Patient presents for Retina Follow Up   HISTORY OF PRESENT ILLNESS: Jorge [name] is a 74 y.o. male who presents to the clinic today for:   HPI    Retina Follow Up    Patient presents with  CRVO/BRVO.  In right eye.  Severity is moderate.  Duration of 3 months.  Since onset it is stable.  I, the attending physician,  performed the HPI with the patient and updated documentation appropriately.          Comments    3 Month f\u OU. Possible Avastin OD. OCT  Pt states no changes in vision. Denies FOL and floaters.       Last edited by Tilda Franco on 12/24/2019 10:15 AM. (History)      Referring physician: Deland Pretty, MD [place],  [place] [id number]  HISTORICAL INFORMATION:   Selected notes from the MEDICAL RECORD NUMBER    Lab Results  Component Value Date   HGBA1C 5.7 (H) 10/29/2013     CURRENT MEDICATIONS: No current outpatient medications on file. (Ophthalmic Drugs)   No current facility-administered medications for this visit. (Ophthalmic Drugs)   Current Outpatient Medications (Other)  Medication Sig  . amoxicillin (AMOXIL) 500 MG capsule Take 1 capsule by mouth every 6 (six) hours.  Marland Kitchen aspirin EC 81 MG tablet Take 81 mg by mouth daily.  Marland Kitchen atorvastatin (LIPITOR) 80 MG tablet Take 40 mg by mouth daily.   Marland Kitchen ezetimibe (ZETIA) 10 MG tablet Take 10 mg by mouth daily with breakfast.  . fish oil-omega-3 fatty acids 1000 MG capsule Take 2 g by mouth daily with breakfast.   . metoprolol succinate (TOPROL-XL) 25 MG 24 hr tablet TAKE 1/2 TABLET BY MOUTH EVERY DAY WITH BREAKFAST  . Multiple Vitamin (MULTIVITAMIN) capsule Take 1 capsule by mouth daily.    Marland Kitchen MYRBETRIQ 50 MG TB24 tablet Take 50 mg by mouth daily.  . pantoprazole (PROTONIX) 40 MG tablet Take 1 tablet (40 mg total) by mouth daily.  . Tofacitinib Citrate (XELJANZ) 5 MG TABS Take 5 mg by mouth daily.   No current facility-administered  medications for this visit. (Other)      REVIEW OF SYSTEMS:    ALLERGIES Allergies  Allergen Reactions  . Niacin Itching and Swelling    PAST MEDICAL HISTORY Past Medical History:  Diagnosis Date  . Arthritis   . CAD (coronary artery disease)    PTCA diagonal 2001, residual 60% LAD /  Nuclear 2006, no ischemia  . Carotid artery disease ([place])    Doppler, October, 2012, 0-39% bilateral stenoses.  . Diverticulitis   . Dyslipidemia   . Ejection fraction    EF normal,  . Eye abnormalities 08-23-2011   right ryr injection for bleed done by dr Zadie Rhine, dr Zadie Rhine told pt ok to have surgery 09-03-2011  . Family history of anesthesia complication    SISTER HAD MEMORY PROBLEMS POST OP  . Preop cardiovascular exam    Patient needs cardiac clearance for hernia surgery May, 2013  . Psoriatic arthritis ([place])    2012  . PVC's (premature ventricular contractions)    Frequent PVCs noted over the years., These always decrease with exercise. This is documented  . Umbilical hernia 11/10/2351   Past Surgical History:  Procedure Laterality Date  . ABLATION  06/09/2019  . ANGIOPLASTY  12/1999 and 03/2000  . CARDIAC CATHETERIZATION  2001  . carpel tunnel  2009/2010  both wrists  . COLONOSCOPY N/A 10/30/2013   Procedure: COLONOSCOPY;  Surgeon: Beryle Beams, MD;  Location: [facility];  Service: Endoscopy;  Laterality: N/A;  . ESOPHAGOGASTRODUODENOSCOPY N/A 10/30/2013   Procedure: ESOPHAGOGASTRODUODENOSCOPY (EGD);  Surgeon: Beryle Beams, MD;  Location: [facility] END[facility]COPY;  Service: Endoscopy;  Laterality: N/A;  . INGUINAL HERNIA REPAIR  09/03/2011   Procedure: HERNIA REPAIR INGUINAL ADULT;  Surgeon: Imogene Burn. Georgette Dover, MD;  Location: [facility];  Service: General;  Laterality: Left;  Left Ingunal Hernia Repair with Mesh  . PVC ABLATION N/A 06/09/2019   Procedure: PVC ABLATION;  Surgeon: Evans Lance, MD;  Location: [facility];  Service: Cardiovascular;  Laterality: N/A;  . right knee meniscus  repair  1990's  . UMBILICAL HERNIA REPAIR  09/03/2011   Procedure: HERNIA REPAIR UMBILICAL ADULT;  Surgeon: Imogene Burn. Georgette Dover, MD;  Location: [facility];  Service: General;  Laterality: N/A;    FAMILY HISTORY Family History  Problem Relation Age of Onset  . Cancer Mother        uterine  . Cancer Sister        Breast  . Crohn's disease Cousin        distant  . Colon cancer Neg Hx   . Ulcerative colitis Neg Hx     SOCIAL HISTORY Social History   Tobacco Use  . Smoking status: Former Smoker    Packs/day: 1.50    Years: 25.00    Pack years: 37.50    Types: Cigarettes    Quit date: 12/08/1994    Years since quitting: 25.0  . Smokeless tobacco: Former Network engineer  . Vaping Use: Never used  Substance Use Topics  . Alcohol use: Yes    Comment: 2 drink per day  . Drug use: No         OPHTHALMIC EXAM: Base Eye Exam    Visual Acuity (Snellen - Linear)      Right Left   Dist cc 20/60 20/25   Dist ph cc 20/30    Correction: Glasses       Tonometry (Tonopen, 10:19 AM)      Right Left   Pressure 16 15       Pupils      Pupils Dark Light Shape React APD   Right PERRL 4 3 Round Slow None   Left PERRL 4 3 Round Slow None       Visual Fields (Counting fingers)      Left Right    Full Full       Neuro/Psych    Oriented x3: Yes   Mood/Affect: Normal       Dilation    Both eyes: 1.0% Mydriacyl, 2.5% Phenylephrine @ 10:19 AM        Slit Lamp and Fundus Exam    External Exam      Right Left   External Normal Normal       Slit Lamp Exam      Right Left   Lids/Lashes Normal Normal   Conjunctiva/Sclera White and quiet White and quiet   Cornea Clear Clear   Anterior Chamber Deep and quiet Deep and quiet   Iris Round and reactive Round and reactive   Lens 2.5+ Nuclear sclerosis 2+ Nuclear sclerosis   Anterior Vitreous Normal Normal       Fundus Exam      Right Left   Posterior Vitreous Posterior vitreous detachment, Central vitreous floaters Normal    Disc Collaterals  Normal   C/D Ratio 0.35 0.4   Macula Cystoid macular edema, Microaneurysms Normal   Vessels Vein occlusion partially compensated with collaterals on the nerve Normal   Periphery Normal Normal          IMAGING AND PROCEDURES  Imaging and Procedures for 12/24/19  OCT, Retina - OU - Both Eyes       Right Eye Quality was good. Scan locations included subfoveal. Central Foveal Thickness: 267. Progression has improved. Findings include cystoid macular edema.   Left Eye Quality was good. Scan locations included subfoveal. Central Foveal Thickness: 286. Progression has been stable. Findings include normal foveal contour.   Notes Perifoveal CME much improved at 37-month interval OD, will repeat injection today and examination again in 4 months       Intravitreal Injection, Pharmacologic Agent - OD - Right Eye       Time Out 12/24/2019. 11:11 AM. Confirmed correct patient, procedure, site, and patient consented.   Anesthesia Topical anesthesia was used. Anesthetic medications included Akten 3.5%.   Procedure Preparation included Ofloxacin , 10% betadine to eyelids, 5% betadine to ocular surface. A 30 gauge needle was used.   Injection:  1.25 mg Bevacizumab (AVASTIN) SOLN   NDC: 70360-001-02, Lot: [id number]   Route: Intravitreal, Site: Right Eye, Waste: 0 mg  Post-op Post injection exam found visual acuity of at least counting fingers. The patient tolerated the procedure well. There were no complications. The patient received written and verbal post procedure care education. Post injection medications were not given.                 ASSESSMENT/PLAN:  Nuclear sclerotic cataract of both eyes The nature of cataract was discussed with the patient as well as the elective nature of surgery. The patient was reassured that surgery at a later date does not put the patient at risk for a worse outcome. It was emphasized that the need for surgery is dictated by the  patient's quality of life as influenced by the cataract. Patient was instructed to maintain close follow up with their general eye care doctor.  Central retinal vein occlusion with macular edema of right eye The nature of central retinal vein occlusion was discussed with the patient including the division of types into nonischemic ischemic. The potential sequelae of ischemic central retinal vein occlusion, including macular edema, neovascularization, rubeosis iridis, and neovascular glaucoma, were discussed, and the need for frequent follow-up.  The nature of macular edema and central retinal vein occlusion was discussed. The following options were considered:  1.Observation for a period to look for spontaneous improvement, is no linger the primary therapy. One-third worsen, one-third stay unchanged, and one-third improves.  2. Anti-VEGF Therapy. ( Lucentis, Avastin or Eylea ) injected  in intravitreal fashion, initially monthly then tailored to clinical response.  3. Intravitreal steroid usage, Kenalog, or Ozurdex, usually a second line therapy or in combination with anti-Vegf therapy noted above.  4. Panretinal laser photocoagulation to cause regression of iris neovascularization, or treat retinal  non-perfusion.  5. Surgical Management may include vitrectomy with incisions of peripheral veins to trigger retino choroidal anastomosis formation. This topic presented and discussed at [facility].       ICD-10-CM   1. Central retinal vein occlusion with macular edema of right eye  H34.8110 OCT, Retina - OU - Both Eyes    Intravitreal Injection, Pharmacologic Agent - OD - Right Eye    Bevacizumab (AVASTIN) SOLN 1.25 mg  2. Cystoid macular edema of  right eye  H35.351   3. Nuclear sclerotic cataract of both eyes  H25.13     1.  2.  3.  Ophthalmic Meds Ordered this visit:  Meds ordered this encounter  Medications  . Bevacizumab (AVASTIN) SOLN 1.25 mg       Return in about 4 months  (around 04/25/2020) for dilate, OD, AVASTIN OCT.  There are no Patient Instructions on file for this visit.   Explained the diagnoses, plan, and follow up with the patient and they expressed understanding.  Patient expressed understanding of the importance of proper follow up care.   Clent Demark Jaydrien Wassenaar M.D. Diseases & Surgery of the Retina and Vitreous [facility]ina [facility] Diabetic [place] 12/24/19     Abbreviations: M myopia (nearsighted); A astigmatism; H hyperopia (farsighted); P presbyopia; Mrx spectacle prescription;  CTL contact lenses; OD right eye; OS left eye; OU both eyes  XT exotropia; ET esotropia; PEK punctate epithelial keratitis; PEE punctate epithelial erosions; DES dry eye syndrome; MGD meibomian gland dysfunction; ATs artificial tears; PFAT's preservative free artificial tears; [place] nuclear sclerotic cataract; PSC posterior subcapsular cataract; ERM epi-retinal membrane; PVD posterior vitreous detachment; RD retinal detachment; DM diabetes mellitus; DR diabetic retinopathy; NPDR non-proliferative diabetic retinopathy; PDR proliferative diabetic retinopathy; CSME clinically significant macular edema; DME diabetic macular edema; dbh dot blot hemorrhages; CWS cotton wool spot; POAG primary open angle glaucoma; C/D cup-to-disc ratio; HVF humphrey visual field; GVF goldmann visual field; OCT optical coherence tomography; IOP intraocular pressure; BRVO Branch retinal vein occlusion; CRVO central retinal vein occlusion; CRAO central retinal artery occlusion; BRAO branch retinal artery occlusion; RT retinal tear; SB scleral buckle; PPV pars plana vitrectomy; VH Vitreous hemorrhage; PRP panretinal laser photocoagulation; IVK intravitreal kenalog; VMT vitreomacular traction; MH Macular hole;  NVD neovascularization of the disc; NVE neovascularization elsewhere; AREDS age related eye disease study; ARMD age related macular degeneration; POAG primary open angle glaucoma; EBMD epithelial/anterior basement  membrane dystrophy; ACIOL anterior chamber intraocular lens; IOL intraocular lens; PCIOL posterior chamber intraocular lens; Phaco/IOL phacoemulsification with intraocular lens placement; [place] photorefractive keratectomy; LASIK laser assisted in situ keratomileusis; HTN hypertension; DM diabetes mellitus; COPD chronic obstructive pulmonary disease

## 2019-12-24 NOTE — Assessment & Plan Note (Signed)

## 2020-01-05 DIAGNOSIS — N411 Chronic prostatitis: Secondary | ICD-10-CM | POA: Diagnosis not present

## 2020-01-05 DIAGNOSIS — R350 Frequency of micturition: Secondary | ICD-10-CM | POA: Diagnosis not present

## 2020-01-08 ENCOUNTER — Encounter: Payer: Self-pay | Admitting: Internal Medicine

## 2020-01-08 ENCOUNTER — Other Ambulatory Visit: Payer: Self-pay

## 2020-01-08 ENCOUNTER — Ambulatory Visit: Payer: Medicare Other | Admitting: Internal Medicine

## 2020-01-08 VITALS — BP 122/78 | HR 87 | Ht 70.0 in | Wt 214.6 lb

## 2020-01-08 DIAGNOSIS — I493 Ventricular premature depolarization: Secondary | ICD-10-CM | POA: Diagnosis not present

## 2020-01-08 DIAGNOSIS — I2510 Atherosclerotic heart disease of native coronary artery without angina pectoris: Secondary | ICD-10-CM | POA: Diagnosis not present

## 2020-01-08 NOTE — Progress Notes (Signed)
HPI [name] returns today for followup. He notes that he has done well in the interim although he states he was almost killed by his bull. He has no palpitations and feels well. He had 30% PVC's and underwent PVC ablation several months ago in the aortic root. Post ablation he has warn a cardiac monitor which demonstrated less than a thousand PVC's in a 24 hour period.  Allergies  Allergen Reactions  . Niacin Itching and Swelling     Current Outpatient Medications  Medication Sig Dispense Refill  . amoxicillin (AMOXIL) 500 MG capsule Take 1 capsule by mouth every 6 (six) hours.    Marland Kitchen aspirin EC 81 MG tablet Take 81 mg by mouth daily.    Marland Kitchen atorvastatin (LIPITOR) 80 MG tablet Take 40 mg by mouth daily.     Marland Kitchen ezetimibe (ZETIA) 10 MG tablet Take 10 mg by mouth daily with breakfast.    . fish oil-omega-3 fatty acids 1000 MG capsule Take 2 g by mouth daily with breakfast.     . metoprolol succinate (TOPROL-XL) 25 MG 24 hr tablet TAKE 1/2 TABLET BY MOUTH EVERY DAY WITH BREAKFAST 45 tablet 3  . Multiple Vitamin (MULTIVITAMIN) capsule Take 1 capsule by mouth daily.      Marland Kitchen MYRBETRIQ 50 MG TB24 tablet Take 50 mg by mouth daily.    . pantoprazole (PROTONIX) 40 MG tablet Take 1 tablet (40 mg total) by mouth daily. 30 tablet 1  . Tofacitinib Citrate (XELJANZ) 5 MG TABS Take 5 mg by mouth daily.     No current facility-administered medications for this visit.     Past Medical History:  Diagnosis Date  . Arthritis   . CAD (coronary artery disease)    PTCA diagonal 2001, residual 60% LAD /  Nuclear 2006, no ischemia  . Carotid artery disease ([place])    Doppler, October, 2012, 0-39% bilateral stenoses.  . Diverticulitis   . Dyslipidemia   . Ejection fraction    EF normal,  . Eye abnormalities 08-23-2011   right ryr injection for bleed done by dr Zadie Rhine, dr Zadie Rhine told pt ok to have surgery 09-03-2011  . Family history of anesthesia complication    SISTER HAD MEMORY PROBLEMS POST OP  .  Preop cardiovascular exam    Patient needs cardiac clearance for hernia surgery May, 2013  . Psoriatic arthritis ([place])    2012  . PVC's (premature ventricular contractions)    Frequent PVCs noted over the years., These always decrease with exercise. This is documented  . Umbilical hernia 5/57/3220    ROS:   All systems reviewed and negative except as noted in the HPI.   Past Surgical History:  Procedure Laterality Date  . ABLATION  06/09/2019  . ANGIOPLASTY  12/1999 and 03/2000  . CARDIAC CATHETERIZATION  2001  . carpel tunnel  2009/2010   both wrists  . COLONOSCOPY N/A 10/30/2013   Procedure: COLONOSCOPY;  Surgeon: Beryle Beams, MD;  Location: [facility];  Service: Endoscopy;  Laterality: N/A;  . ESOPHAGOGASTRODUODENOSCOPY N/A 10/30/2013   Procedure: ESOPHAGOGASTRODUODENOSCOPY (EGD);  Surgeon: Beryle Beams, MD;  Location: [facility] END[facility]COPY;  Service: Endoscopy;  Laterality: N/A;  . INGUINAL HERNIA REPAIR  09/03/2011   Procedure: HERNIA REPAIR INGUINAL ADULT;  Surgeon: Imogene Burn. Georgette Dover, MD;  Location: [facility];  Service: General;  Laterality: Left;  Left Ingunal Hernia Repair with Mesh  . PVC ABLATION N/A 06/09/2019   Procedure: PVC ABLATION;  Surgeon: Evans Lance,  MD;  Location: [facility];  Service: Cardiovascular;  Laterality: N/A;  . right knee meniscus repair  1990's  . UMBILICAL HERNIA REPAIR  09/03/2011   Procedure: HERNIA REPAIR UMBILICAL ADULT;  Surgeon: Imogene Burn. Georgette Dover, MD;  Location: [facility];  Service: General;  Laterality: N/A;     Family History  Problem Relation Age of Onset  . Cancer Mother        uterine  . Cancer Sister        Breast  . Crohn's disease Cousin        distant  . Colon cancer Neg Hx   . Ulcerative colitis Neg Hx      Social History   Socioeconomic History  . Marital status: Married    Spouse name: Not on file  . Number of children: 1  . Years of education: Not on file  . Highest education level: Some college, no degree   Occupational History  . Not on file  Tobacco Use  . Smoking status: Former Smoker    Packs/day: 1.50    Years: 25.00    Pack years: 37.50    Types: Cigarettes    Quit date: 12/08/1994    Years since quitting: 25.1  . Smokeless tobacco: Former Network engineer  . Vaping Use: Never used  Substance and Sexual Activity  . Alcohol use: Yes    Comment: 2 drink per day  . Drug use: No  . Sexual activity: Not on file  Other Topics Concern  . Not on file  Social History Narrative   Does not use cane or walker, independent of IDLs   Right handed    Caffeine 1 cup daily    Lives at home with spouse    Social Determinants of Health   Financial Resource Strain:   . Difficulty of Paying Living Expenses: Not on file  Food Insecurity:   . Worried About Charity fundraiser in the Last Year: Not on file  . Ran Out of Food in the Last Year: Not on file  Transportation Needs:   . Lack of Transportation (Medical): Not on file  . Lack of Transportation (Non-Medical): Not on file  Physical Activity:   . Days of Exercise per Week: Not on file  . Minutes of Exercise per Session: Not on file  Stress:   . Feeling of Stress : Not on file  Social Connections:   . Frequency of Communication with Friends and Family: Not on file  . Frequency of Social Gatherings with Friends and Family: Not on file  . Attends Religious Services: Not on file  . Active Member of Clubs or Organizations: Not on file  . Attends Archivist Meetings: Not on file  . Marital Status: Not on file  Intimate Partner Violence:   . Fear of Current or Ex-Partner: Not on file  . Emotionally Abused: Not on file  . Physically Abused: Not on file  . Sexually Abused: Not on file     BP 122/78   Pulse 87   Ht 5\' 10"  (1.778 m)   Wt 214 lb 9.6 oz (97.3 kg)   SpO2 93%   BMI 30.79 kg/m   Physical Exam:  Well appearing NAD HEENT: Unremarkable Neck:  No JVD, no thyromegally Lymphatics:  No adenopathy Back:  No  CVA tenderness Lungs:  Clear with no wheezes HEART:  Regular rate rhythm, no murmurs, no rubs, no clicks Abd:  soft, positive bowel sounds, no organomegally,  no rebound, no guarding Ext:  2 plus pulses, no edema, no cyanosis, no clubbing Skin:  No rashes no nodules Neuro:  CN II through XII intact, motor grossly intact  EKG - NSR   Assess/Plan: 1. PVC's - he is s/p ablation and is doing well with no symptoms.  2. DVT - he had a DVT after the ablation which appears to be resolved. He is asymptomatic and appears to have healed. 3. HTN - his bp is well controlled. We will follow.  Carleene Overlie Intisar Claudio,MD

## 2020-01-08 NOTE — Patient Instructions (Addendum)
Medication Instructions:  Your physician recommends that you continue on your current medications as directed. Please refer to the Current Medication list given to you today.  Labwork: None ordered.  Testing/Procedures: None ordered.  Follow-Up: Your physician wants you to follow-up in: as needed with Dr. Taylor.      Any Other Special Instructions Will Be Listed Below (If Applicable).  If you need a refill on your cardiac medications before your next appointment, please call your pharmacy.   

## 2020-01-18 DIAGNOSIS — Z0120 Encounter for dental examination and cleaning without abnormal findings: Secondary | ICD-10-CM | POA: Diagnosis not present

## 2020-02-05 DIAGNOSIS — H2513 Age-related nuclear cataract, bilateral: Secondary | ICD-10-CM | POA: Diagnosis not present

## 2020-02-05 DIAGNOSIS — H34811 Central retinal vein occlusion, right eye, with macular edema: Secondary | ICD-10-CM | POA: Diagnosis not present

## 2020-02-05 DIAGNOSIS — H2511 Age-related nuclear cataract, right eye: Secondary | ICD-10-CM | POA: Diagnosis not present

## 2020-02-05 DIAGNOSIS — H25013 Cortical age-related cataract, bilateral: Secondary | ICD-10-CM | POA: Diagnosis not present

## 2020-02-22 DIAGNOSIS — M5030 Other cervical disc degeneration, unspecified cervical region: Secondary | ICD-10-CM | POA: Diagnosis not present

## 2020-02-22 DIAGNOSIS — M1990 Unspecified osteoarthritis, unspecified site: Secondary | ICD-10-CM | POA: Diagnosis not present

## 2020-02-22 DIAGNOSIS — M0609 Rheumatoid arthritis without rheumatoid factor, multiple sites: Secondary | ICD-10-CM | POA: Diagnosis not present

## 2020-02-22 DIAGNOSIS — Z79899 Other long term (current) drug therapy: Secondary | ICD-10-CM | POA: Diagnosis not present

## 2020-03-14 DIAGNOSIS — I10 Essential (primary) hypertension: Secondary | ICD-10-CM | POA: Diagnosis not present

## 2020-03-14 DIAGNOSIS — Z125 Encounter for screening for malignant neoplasm of prostate: Secondary | ICD-10-CM | POA: Diagnosis not present

## 2020-03-15 DIAGNOSIS — R7309 Other abnormal glucose: Secondary | ICD-10-CM | POA: Diagnosis not present

## 2020-03-17 DIAGNOSIS — Z0000 Encounter for general adult medical examination without abnormal findings: Secondary | ICD-10-CM | POA: Diagnosis not present

## 2020-03-17 DIAGNOSIS — Z23 Encounter for immunization: Secondary | ICD-10-CM | POA: Diagnosis not present

## 2020-03-17 DIAGNOSIS — I2510 Atherosclerotic heart disease of native coronary artery without angina pectoris: Secondary | ICD-10-CM | POA: Diagnosis not present

## 2020-03-17 DIAGNOSIS — I10 Essential (primary) hypertension: Secondary | ICD-10-CM | POA: Diagnosis not present

## 2020-04-06 DIAGNOSIS — H25011 Cortical age-related cataract, right eye: Secondary | ICD-10-CM | POA: Diagnosis not present

## 2020-04-06 DIAGNOSIS — H2511 Age-related nuclear cataract, right eye: Secondary | ICD-10-CM | POA: Diagnosis not present

## 2020-04-06 DIAGNOSIS — H25013 Cortical age-related cataract, bilateral: Secondary | ICD-10-CM | POA: Diagnosis not present

## 2020-04-06 DIAGNOSIS — H2513 Age-related nuclear cataract, bilateral: Secondary | ICD-10-CM | POA: Diagnosis not present

## 2020-04-12 DIAGNOSIS — H2512 Age-related nuclear cataract, left eye: Secondary | ICD-10-CM | POA: Diagnosis not present

## 2020-04-12 DIAGNOSIS — H25012 Cortical age-related cataract, left eye: Secondary | ICD-10-CM | POA: Diagnosis not present

## 2020-04-14 DIAGNOSIS — R972 Elevated prostate specific antigen [PSA]: Secondary | ICD-10-CM | POA: Diagnosis not present

## 2020-04-25 ENCOUNTER — Encounter (INDEPENDENT_AMBULATORY_CARE_PROVIDER_SITE_OTHER): Payer: Medicare Other | Admitting: Ophthalmology

## 2020-04-25 ENCOUNTER — Ambulatory Visit (INDEPENDENT_AMBULATORY_CARE_PROVIDER_SITE_OTHER): Payer: Medicare Other | Admitting: Ophthalmology

## 2020-04-25 ENCOUNTER — Other Ambulatory Visit: Payer: Self-pay

## 2020-04-25 ENCOUNTER — Encounter (INDEPENDENT_AMBULATORY_CARE_PROVIDER_SITE_OTHER): Payer: Self-pay | Admitting: Ophthalmology

## 2020-04-25 DIAGNOSIS — H34811 Central retinal vein occlusion, right eye, with macular edema: Secondary | ICD-10-CM

## 2020-04-25 DIAGNOSIS — H2512 Age-related nuclear cataract, left eye: Secondary | ICD-10-CM

## 2020-04-25 MED ORDER — BEVACIZUMAB 2.5 MG/0.1ML IZ SOSY
2.5000 mg | PREFILLED_SYRINGE | INTRAVITREAL | Status: AC | PRN
  Administered 2020-04-25: 2.5 mg via INTRAVITREAL

## 2020-04-25 NOTE — Progress Notes (Signed)
04/25/2020     CHIEF COMPLAINT Patient presents for Retina Follow Up (4 MO FU OU, POSS AVASTIN OD. CAT SX 04/06/20 OD Dr. Kathlen Mody. Pt taking a SX drop BID OD, unsure of what drop. ///Pt reports stable vision OD. Pt reports some pressure OD. Pt denies any F/F OD. )   HISTORY OF PRESENT ILLNESS: Jorge [name] is a 75 y.o. male who presents to the clinic today for:   HPI    Retina Follow Up    Patient presents with  CRVO/BRVO.  In right eye.  This started 4 months ago.  Duration of 4 months.  Since onset it is stable. Additional comments: 4 MO FU OU, POSS AVASTIN OD. CAT SX 04/06/20 OD Dr. Kathlen Mody. Pt taking a SX drop BID OD, unsure of what drop.    Pt reports stable vision OD. Pt reports some pressure OD. Pt denies any F/F OD.        Last edited by Nichola Sizer D on 04/25/2020  2:04 PM. (History)      Referring physician: Deland Pretty, MD [place],  [place] [address]  HISTORICAL INFORMATION:   Selected notes from the MEDICAL RECORD NUMBER    Lab Results  Component Value Date   HGBA1C 5.7 (H) 10/29/2013     CURRENT MEDICATIONS: No current outpatient medications on file. (Ophthalmic Drugs)   No current facility-administered medications for this visit. (Ophthalmic Drugs)   Current Outpatient Medications (Other)  Medication Sig  . amoxicillin (AMOXIL) 500 MG capsule Take 1 capsule by mouth every 6 (six) hours.  Marland Kitchen aspirin EC 81 MG tablet Take 81 mg by mouth daily.  Marland Kitchen atorvastatin (LIPITOR) 80 MG tablet Take 40 mg by mouth daily.   Marland Kitchen ezetimibe (ZETIA) 10 MG tablet Take 10 mg by mouth daily with breakfast.  . fish oil-omega-3 fatty acids 1000 MG capsule Take 2 g by mouth daily with breakfast.   . metoprolol succinate (TOPROL-XL) 25 MG 24 hr tablet TAKE 1/2 TABLET BY MOUTH EVERY DAY WITH BREAKFAST  . Multiple Vitamin (MULTIVITAMIN) capsule Take 1 capsule by mouth daily.    Marland Kitchen MYRBETRIQ 50 MG TB24 tablet Take 50 mg by mouth daily.  .  pantoprazole (PROTONIX) 40 MG tablet Take 1 tablet (40 mg total) by mouth daily.  . Tofacitinib Citrate (XELJANZ) 5 MG TABS Take 5 mg by mouth daily.   No current facility-administered medications for this visit. (Other)      REVIEW OF SYSTEMS:    ALLERGIES Allergies  Allergen Reactions  . Niacin Itching and Swelling    PAST MEDICAL HISTORY Past Medical History:  Diagnosis Date  . Arthritis   . CAD (coronary artery disease)    PTCA diagonal 2001, residual 60% LAD /  Nuclear 2006, no ischemia  . Carotid artery disease ([place])    Doppler, October, 2012, 0-39% bilateral stenoses.  . Diverticulitis   . Dyslipidemia   . Ejection fraction    EF normal,  . Eye abnormalities 08-23-2011   right ryr injection for bleed done by dr Zadie Rhine, dr Zadie Rhine told pt ok to have surgery 09-03-2011  . Family history of anesthesia complication    SISTER HAD MEMORY PROBLEMS POST OP  . Preop cardiovascular exam    Patient needs cardiac clearance for hernia surgery May, 2013  . Psoriatic arthritis ([place])    2012  . PVC's (premature ventricular contractions)    Frequent PVCs noted over the years., These always decrease with exercise. This is  documented  . Umbilical hernia 6/37/8588   Past Surgical History:  Procedure Laterality Date  . ABLATION  06/09/2019  . ANGIOPLASTY  12/1999 and 03/2000  . CARDIAC CATHETERIZATION  2001  . carpel tunnel  2009/2010   both wrists  . COLONOSCOPY N/A 10/30/2013   Procedure: COLONOSCOPY;  Surgeon: Beryle Beams, MD;  Location: [facility];  Service: Endoscopy;  Laterality: N/A;  . ESOPHAGOGASTRODUODENOSCOPY N/A 10/30/2013   Procedure: ESOPHAGOGASTRODUODENOSCOPY (EGD);  Surgeon: Beryle Beams, MD;  Location: [facility];  Service: Endoscopy;  Laterality: N/A;  . INGUINAL HERNIA REPAIR  09/03/2011   Procedure: HERNIA REPAIR INGUINAL ADULT;  Surgeon: Imogene Burn. Georgette Dover, MD;  Location: [facility];  Service: General;  Laterality: Left;  Left Ingunal Hernia Repair with Mesh   . PVC ABLATION N/A 06/09/2019   Procedure: PVC ABLATION;  Surgeon: Evans Lance, MD;  Location: [place] [facility];  Service: Cardiovascular;  Laterality: N/A;  . right knee meniscus repair  1990's  . UMBILICAL HERNIA REPAIR  09/03/2011   Procedure: HERNIA REPAIR UMBILICAL ADULT;  Surgeon: Imogene Burn. Georgette Dover, MD;  Location: [facility];  Service: General;  Laterality: N/A;    FAMILY HISTORY Family History  Problem Relation Age of Onset  . Cancer Mother        uterine  . Cancer Sister        Breast  . Crohn's disease Cousin        distant  . Colon cancer Neg Hx   . Ulcerative colitis Neg Hx     SOCIAL HISTORY Social History   Tobacco Use  . Smoking status: Former Smoker    Packs/day: 1.50    Years: 25.00    Pack years: 37.50    Types: Cigarettes    Quit date: 12/08/1994    Years since quitting: 25.3  . Smokeless tobacco: Former Network engineer  . Vaping Use: Never used  Substance Use Topics  . Alcohol use: Yes    Comment: 2 drink per day  . Drug use: No         OPHTHALMIC EXAM:  Base Eye Exam    Visual Acuity (ETDRS)      Right Left   Dist [place] 20/100 +2    Dist cc  20/30 -2   Dist ph [place] 20/40 -1    Dist ph cc  20/30 -1   Correction: Glasses       Tonometry (Tonopen, 2:10 PM)      Right Left   Pressure 16 11       Pupils      Pupils Dark Light Shape React APD   Right PERRL 4 3 Round Slow None   Left PERRL 4 3 Round Slow None       Visual Fields (Counting fingers)      Left Right    Full Full       Extraocular Movement      Right Left    Full Full       Neuro/Psych    Oriented x3: Yes   Mood/Affect: Normal       Dilation    Right eye: 1.0% Mydriacyl, 2.5% Phenylephrine @ 2:11 PM        Slit Lamp and Fundus Exam    External Exam      Right Left   External Normal Normal       Slit Lamp Exam      Right Left   Lids/Lashes Normal Normal  Conjunctiva/Sclera White and quiet White and quiet   Cornea Clear Clear   Anterior Chamber Deep  and quiet Deep and quiet   Iris Round and reactive Round and reactive   Lens Centered posterior chamber intraocular lens 2+ Nuclear sclerosis   Anterior Vitreous Normal Normal       Fundus Exam      Right Left   Posterior Vitreous Posterior vitreous detachment, Central vitreous floaters Normal   Disc Collaterals Normal   C/D Ratio 0.35 0.4   Macula Cystoid macular edema, Microaneurysms Normal   Vessels Vein occlusion partially compensated with collaterals on the nerve Normal   Periphery Normal Normal          IMAGING AND PROCEDURES  Imaging and Procedures for 04/25/20  OCT, Retina - OU - Both Eyes       Right Eye Quality was good. Scan locations included subfoveal. Central Foveal Thickness: 408. Progression has worsened. Findings include cystoid macular edema.   Left Eye Quality was good. Scan locations included subfoveal. Central Foveal Thickness: 291. Progression has been stable.   Notes Increased and recurrent CME OD status post intravitreal Avastin 4 months previous.         Intravitreal Injection, Pharmacologic Agent - OD - Right Eye       Time Out 04/25/2020. 2:32 PM. Confirmed correct patient, procedure, site, and patient consented.   Anesthesia Topical anesthesia was used. Anesthetic medications included Akten 3.5%.   Procedure Preparation included Ofloxacin , 10% betadine to eyelids, 5% betadine to ocular surface. A 30 gauge needle was used.   Injection:  2.5 mg Bevacizumab (AVASTIN) 2.5mg /0.85mL SOSY   NDC: 70623-762-83, Lot: [id number]   Route: Intravitreal, Site: Right Eye  Post-op Post injection exam found visual acuity of at least counting fingers. The patient tolerated the procedure well. There were no complications. The patient received written and verbal post procedure care education. Post injection medications were not given.                 ASSESSMENT/PLAN:  Central retinal vein occlusion with macular edema of right eye The nature of  central retinal vein occlusion was discussed with the patient including the division of types into nonischemic ischemic. The potential sequelae of ischemic central retinal vein occlusion, including macular edema, neovascularization, rubeosis iridis, and neovascular glaucoma, were discussed, and the need for frequent follow-up.  The nature of macular edema and central retinal vein occlusion was discussed. The following options were considered:  1.Observation for a period to look for spontaneous improvement, is no linger the primary therapy. One-third worsen, one-third stay unchanged, and one-third improves.  2. Anti-VEGF Therapy. ( Lucentis, Avastin or Eylea ) injected  in intravitreal fashion, initially monthly then tailored to clinical response.  3. Intravitreal steroid usage, Kenalog, or Ozurdex, usually a second line therapy or in combination with anti-Vegf therapy noted above.  4. Panretinal laser photocoagulation to cause regression of iris neovascularization, or treat retinal  non-perfusion.  5. Surgical Management may include vitrectomy with incisions of peripheral veins to trigger retino choroidal anastomosis formation. This topic presented and discussed at [place].       ICD-10-CM   1. Central retinal vein occlusion with macular edema of right eye  H34.8110 OCT, Retina - OU - Both Eyes    Intravitreal Injection, Pharmacologic Agent - OD - Right Eye    bevacizumab (AVASTIN) SOSY 2.5 mg  2. Nuclear sclerotic cataract of left eye  H25.12     1.  2.  3.  Ophthalmic Meds Ordered this visit:  Meds ordered this encounter  Medications  . bevacizumab (AVASTIN) SOSY 2.5 mg       Return in about 8 weeks (around 06/20/2020) for dilate, OD, AVASTIN OCT.  There are no Patient Instructions on file for this visit.   Explained the diagnoses, plan, and follow up with the patient and they expressed understanding.  Patient expressed understanding of the importance of proper follow up  care.   Clent Demark Nelline Lio M.D. Diseases & Surgery of the Retina and Vitreous Retina & Diabetic [place] 04/25/20     Abbreviations: M myopia (nearsighted); A astigmatism; H hyperopia (farsighted); P presbyopia; Mrx spectacle prescription;  CTL contact lenses; OD right eye; OS left eye; OU both eyes  XT exotropia; ET esotropia; PEK punctate epithelial keratitis; PEE punctate epithelial erosions; DES dry eye syndrome; MGD meibomian gland dysfunction; ATs artificial tears; PFAT's preservative free artificial tears; [place] nuclear sclerotic cataract; PSC posterior subcapsular cataract; ERM epi-retinal membrane; PVD posterior vitreous detachment; RD retinal detachment; DM diabetes mellitus; DR diabetic retinopathy; NPDR non-proliferative diabetic retinopathy; PDR proliferative diabetic retinopathy; CSME clinically significant macular edema; DME diabetic macular edema; dbh dot blot hemorrhages; CWS cotton wool spot; POAG primary open angle glaucoma; C/D cup-to-disc ratio; HVF humphrey visual field; GVF goldmann visual field; OCT optical coherence tomography; IOP intraocular pressure; BRVO Branch retinal vein occlusion; CRVO central retinal vein occlusion; CRAO central retinal artery occlusion; BRAO branch retinal artery occlusion; RT retinal tear; SB scleral buckle; PPV pars plana vitrectomy; VH Vitreous hemorrhage; PRP panretinal laser photocoagulation; IVK intravitreal kenalog; VMT vitreomacular traction; MH Macular hole;  NVD neovascularization of the disc; NVE neovascularization elsewhere; AREDS age related eye disease study; ARMD age related macular degeneration; POAG primary open angle glaucoma; EBMD epithelial/anterior basement membrane dystrophy; ACIOL anterior chamber intraocular lens; IOL intraocular lens; PCIOL posterior chamber intraocular lens; Phaco/IOL phacoemulsification with intraocular lens placement; [place] photorefractive keratectomy; LASIK laser assisted in situ keratomileusis; HTN hypertension;  DM diabetes mellitus; COPD chronic obstructive pulmonary disease

## 2020-04-25 NOTE — Assessment & Plan Note (Signed)
The nature of central retinal vein occlusion was discussed with the patient including the division of types into nonischemic ischemic. The potential sequelae of ischemic central retinal vein occlusion, including macular edema, neovascularization, rubeosis iridis, and neovascular glaucoma, were discussed, and the need for frequent follow-up.  The nature of macular edema and central retinal vein occlusion was discussed. The following options were considered:  1.Observation for a period to look for spontaneous improvement, is no linger the primary therapy. One-third worsen, one-third stay unchanged, and one-third improves.  2. Anti-VEGF Therapy. ( Lucentis, Avastin or Eylea ) injected  in intravitreal fashion, initially monthly then tailored to clinical response.  3. Intravitreal steroid usage, Kenalog, or Ozurdex, usually a second line therapy or in combination with anti-Vegf therapy noted above.  4. Panretinal laser photocoagulation to cause regression of iris neovascularization, or treat retinal  non-perfusion.  5. Surgical Management may include vitrectomy with incisions of peripheral veins to trigger retino choroidal anastomosis formation. This topic presented and discussed at [place].

## 2020-04-28 DIAGNOSIS — S46011A Strain of muscle(s) and tendon(s) of the rotator cuff of right shoulder, initial encounter: Secondary | ICD-10-CM | POA: Diagnosis not present

## 2020-05-06 DIAGNOSIS — M25511 Pain in right shoulder: Secondary | ICD-10-CM | POA: Diagnosis not present

## 2020-05-18 DIAGNOSIS — H2512 Age-related nuclear cataract, left eye: Secondary | ICD-10-CM | POA: Diagnosis not present

## 2020-05-18 DIAGNOSIS — H25012 Cortical age-related cataract, left eye: Secondary | ICD-10-CM | POA: Diagnosis not present

## 2020-05-18 DIAGNOSIS — H25812 Combined forms of age-related cataract, left eye: Secondary | ICD-10-CM | POA: Diagnosis not present

## 2020-05-30 DIAGNOSIS — M25511 Pain in right shoulder: Secondary | ICD-10-CM | POA: Diagnosis not present

## 2020-06-16 ENCOUNTER — Encounter (INDEPENDENT_AMBULATORY_CARE_PROVIDER_SITE_OTHER): Payer: Self-pay

## 2020-06-20 ENCOUNTER — Encounter (INDEPENDENT_AMBULATORY_CARE_PROVIDER_SITE_OTHER): Payer: Self-pay | Admitting: Ophthalmology

## 2020-06-20 ENCOUNTER — Ambulatory Visit (INDEPENDENT_AMBULATORY_CARE_PROVIDER_SITE_OTHER): Payer: Medicare Other | Admitting: Ophthalmology

## 2020-06-20 ENCOUNTER — Other Ambulatory Visit: Payer: Self-pay

## 2020-06-20 DIAGNOSIS — H35351 Cystoid macular degeneration, right eye: Secondary | ICD-10-CM | POA: Diagnosis not present

## 2020-06-20 DIAGNOSIS — H34811 Central retinal vein occlusion, right eye, with macular edema: Secondary | ICD-10-CM | POA: Diagnosis not present

## 2020-06-20 DIAGNOSIS — M25511 Pain in right shoulder: Secondary | ICD-10-CM | POA: Diagnosis not present

## 2020-06-20 MED ORDER — BEVACIZUMAB 2.5 MG/0.1ML IZ SOSY
2.5000 mg | PREFILLED_SYRINGE | INTRAVITREAL | Status: AC | PRN
  Administered 2020-06-20: 2.5 mg via INTRAVITREAL

## 2020-06-20 NOTE — Assessment & Plan Note (Signed)
Improved s CME OD from CRV O at 8-week follow-up post Avastin.  We will repeat injection today extend interval examination next to 10 weeks

## 2020-06-20 NOTE — Progress Notes (Signed)
06/20/2020     CHIEF COMPLAINT Patient presents for Retina Follow Up (8 Wk F/U OD, poss Avastin OD//Pt sts he recently had CEIOL OU. Pt sts, "hopefully I'll be able to see better next week when I get my glasses." Pt denies new symptoms OU. Pt sts he is still using "yellow cap" drop OS QD x 1 more week s/p CEIOL.)   HISTORY OF PRESENT ILLNESS: Jorge [name] is a 75 y.o. male who presents to the clinic today for:   HPI    Retina Follow Up    Patient presents with  CRVO/BRVO.  In right eye.  This started 8 weeks ago.  Severity is mild.  Duration of 8 weeks.  Since onset it is stable. Additional comments: 8 Wk F/U OD, poss Avastin OD  Pt sts he recently had CEIOL OU. Pt sts, "hopefully I'll be able to see better next week when I get my glasses." Pt denies new symptoms OU. Pt sts he is still using "yellow cap" drop OS QD x 1 more week s/p CEIOL.       Last edited by Rockie Neighbours, [place] on 06/20/2020  1:22 PM. (History)      Referring physician: Deland Pretty, MD [place],  [place] [address]  HISTORICAL INFORMATION:   Selected notes from the MEDICAL RECORD NUMBER    Lab Results  Component Value Date   HGBA1C 5.7 (H) 10/29/2013     CURRENT MEDICATIONS: No current outpatient medications on file. (Ophthalmic Drugs)   No current facility-administered medications for this visit. (Ophthalmic Drugs)   Current Outpatient Medications (Other)  Medication Sig  . amoxicillin (AMOXIL) 500 MG capsule Take 1 capsule by mouth every 6 (six) hours.  Marland Kitchen aspirin EC 81 MG tablet Take 81 mg by mouth daily.  Marland Kitchen atorvastatin (LIPITOR) 80 MG tablet Take 40 mg by mouth daily.   Marland Kitchen ezetimibe (ZETIA) 10 MG tablet Take 10 mg by mouth daily with breakfast.  . fish oil-omega-3 fatty acids 1000 MG capsule Take 2 g by mouth daily with breakfast.   . metoprolol succinate (TOPROL-XL) 25 MG 24 hr tablet TAKE 1/2 TABLET BY MOUTH EVERY DAY WITH BREAKFAST  . Multiple Vitamin  (MULTIVITAMIN) capsule Take 1 capsule by mouth daily.    Marland Kitchen MYRBETRIQ 50 MG TB24 tablet Take 50 mg by mouth daily.  . pantoprazole (PROTONIX) 40 MG tablet Take 1 tablet (40 mg total) by mouth daily.  . Tofacitinib Citrate (XELJANZ) 5 MG TABS Take 5 mg by mouth daily.   No current facility-administered medications for this visit. (Other)      REVIEW OF SYSTEMS:    ALLERGIES Allergies  Allergen Reactions  . Niacin Itching and Swelling    PAST MEDICAL HISTORY Past Medical History:  Diagnosis Date  . Arthritis   . CAD (coronary artery disease)    PTCA diagonal 2001, residual 60% LAD /  Nuclear 2006, no ischemia  . Carotid artery disease ([place])    Doppler, October, 2012, 0-39% bilateral stenoses.  . Diverticulitis   . Dyslipidemia   . Ejection fraction    EF normal,  . Eye abnormalities 08-23-2011   right ryr injection for bleed done by dr Zadie Rhine, dr Zadie Rhine told pt ok to have surgery 09-03-2011  . Family history of anesthesia complication    SISTER HAD MEMORY PROBLEMS POST OP  . Preop cardiovascular exam    Patient needs cardiac clearance for hernia surgery May, 2013  . Psoriatic arthritis ([place])  2012  . PVC's (premature ventricular contractions)    Frequent PVCs noted over the years., These always decrease with exercise. This is documented  . Umbilical hernia 1/61/0960   Past Surgical History:  Procedure Laterality Date  . ABLATION  06/09/2019  . ANGIOPLASTY  12/1999 and 03/2000  . CARDIAC CATHETERIZATION  2001  . carpel tunnel  2009/2010   both wrists  . COLONOSCOPY N/A 10/30/2013   Procedure: COLONOSCOPY;  Surgeon: Beryle Beams, MD;  Location: [facility];  Service: Endoscopy;  Laterality: N/A;  . ESOPHAGOGASTRODUODENOSCOPY N/A 10/30/2013   Procedure: ESOPHAGOGASTRODUODENOSCOPY (EGD);  Surgeon: Beryle Beams, MD;  [facility]: [facility] ENDOSCOPY;  Service: Endoscopy;  Laterality: N/A;  . INGUINAL HERNIA REPAIR  09/03/2011   Procedure: HERNIA REPAIR INGUINAL ADULT;  Surgeon:  Imogene Burn. Georgette Dover, MD;  Location: [facility];  Service: General;  Laterality: Left;  Left Ingunal Hernia Repair with Mesh  . PVC ABLATION N/A 06/09/2019   Procedure: PVC ABLATION;  Surgeon: Evans Lance, MD;  Location: [facility];  Service: Cardiovascular;  Laterality: N/A;  . right knee meniscus repair  1990's  . UMBILICAL HERNIA REPAIR  09/03/2011   Procedure: HERNIA REPAIR UMBILICAL ADULT;  Surgeon: Imogene Burn. Georgette Dover, MD;  Location: [facility];  Service: General;  Laterality: N/A;    FAMILY HISTORY Family History  Problem Relation Age of Onset  . Cancer Mother        uterine  . Cancer Sister        Breast  . Crohn's disease Cousin        distant  . Colon cancer Neg Hx   . Ulcerative colitis Neg Hx     SOCIAL HISTORY Social History   Tobacco Use  . Smoking status: Former Smoker    Packs/day: 1.50    Years: 25.00    Pack years: 37.50    Types: Cigarettes    Quit date: 12/08/1994    Years since quitting: 25.5  . Smokeless tobacco: Former Network engineer  . Vaping Use: Never used  Substance Use Topics  . Alcohol use: Yes    Comment: 2 drink per day  . Drug use: No         OPHTHALMIC EXAM:  Base Eye Exam    Visual Acuity (ETDRS)      Right Left   Dist [place] 20/60 +1 20/30 +1   Dist ph [place] 20/30 -1 20/20 -2       Tonometry (Tonopen, 1:22 PM)      Right Left   Pressure 18 14       Pupils      Pupils Dark Light Shape React APD   Right PERRL 5 4 Round Brisk None   Left PERRL 5 4 Round Brisk None       Visual Fields (Counting fingers)      Left Right    Full Full       Extraocular Movement      Right Left    Full Full       Neuro/Psych    Oriented x3: Yes   Mood/Affect: Normal       Dilation    Right eye: 1.0% Mydriacyl, 2.5% Phenylephrine @ 1:26 PM        Slit Lamp and Fundus Exam    External Exam      Right Left   External Normal Normal       Slit Lamp Exam      Right Left  Lids/Lashes Normal Normal   Conjunctiva/Sclera White and  quiet White and quiet   Cornea Clear Clear   Anterior Chamber Deep and quiet Deep and quiet   Iris Round and reactive Round and reactive   Lens Centered posterior chamber intraocular lens 2+ Nuclear sclerosis   Anterior Vitreous Normal Normal       Fundus Exam      Right Left   Posterior Vitreous Posterior vitreous detachment, Central vitreous floaters    Disc Collaterals    C/D Ratio 0.35    Macula Microaneurysms, no cystoid macular edema    Vessels Vein occlusion partially compensated with collaterals on the nerve    Periphery Normal           IMAGING AND PROCEDURES  Imaging and Procedures for 06/20/20  OCT, Retina - OU - Both Eyes       Right Eye Quality was good. Scan locations included subfoveal. Central Foveal Thickness: 279. Progression has improved. Findings include abnormal foveal contour.   Left Eye Quality was good. Scan locations included subfoveal. Central Foveal Thickness: 297. Progression has been stable. Findings include normal foveal contour.   Notes Much improved CME today OD secondary to Hemi central retinal vein occlusion right eye chronic and recurrent currently at 8-week follow-up.  Repeat injection today and reevaluate in 10 weeks       Intravitreal Injection, Pharmacologic Agent - OD - Right Eye       Time Out 06/20/2020. 2:17 PM. Confirmed correct patient, procedure, site, and patient consented.   Anesthesia Topical anesthesia was used. Anesthetic medications included Akten 3.5%.   Procedure Preparation included Ofloxacin , 10% betadine to eyelids, 5% betadine to ocular surface. A 30 gauge needle was used.   Injection:  2.5 mg Bevacizumab (AVASTIN) 2.5mg /0.8mL SOSY   NDC: 65465-035-46, Lot: [id number]   Route: Intravitreal, Site: Right Eye  Post-op Post injection exam found visual acuity of at least counting fingers. The patient tolerated the procedure well. There were no complications. The patient received written and verbal post  procedure care education. Post injection medications were not given.                 ASSESSMENT/PLAN:  Central retinal vein occlusion with macular edema of right eye Improved s CME OD from CRV O at 8-week follow-up post Avastin.  We will repeat injection today extend interval examination next to 10 weeks  Cystoid macular edema of right eye Secondary to CRV O, improved today week follow-up      ICD-10-CM   1. Central retinal vein occlusion with macular edema of right eye  H34.8110 OCT, Retina - OU - Both Eyes    Intravitreal Injection, Pharmacologic Agent - OD - Right Eye    bevacizumab (AVASTIN) SOSY 2.5 mg  2. Cystoid macular edema of right eye  H35.351     1.  2.  3.  Ophthalmic Meds Ordered this visit:  Meds ordered this encounter  Medications  . bevacizumab (AVASTIN) SOSY 2.5 mg       Return in about 10 weeks (around 08/29/2020) for dilate, OD, AVASTIN OCT.  There are no Patient Instructions on file for this visit.   Explained the diagnoses, plan, and follow up with the patient and they expressed understanding.  Patient expressed understanding of the importance of proper follow up care.   Clent Demark Sangita Zani M.D. Diseases & Surgery of the Retina and Vitreous Retina & Diabetic [place] 06/20/20     Abbreviations: M myopia (nearsighted); A  astigmatism; H hyperopia (farsighted); P presbyopia; Mrx spectacle prescription;  CTL contact lenses; OD right eye; OS left eye; OU both eyes  XT exotropia; ET esotropia; PEK punctate epithelial keratitis; PEE punctate epithelial erosions; DES dry eye syndrome; MGD meibomian gland dysfunction; ATs artificial tears; PFAT's preservative free artificial tears; [place] nuclear sclerotic cataract; PSC posterior subcapsular cataract; ERM epi-retinal membrane; PVD posterior vitreous detachment; RD retinal detachment; DM diabetes mellitus; DR diabetic retinopathy; NPDR non-proliferative diabetic retinopathy; PDR proliferative diabetic  retinopathy; CSME clinically significant macular edema; DME diabetic macular edema; dbh dot blot hemorrhages; CWS cotton wool spot; POAG primary open angle glaucoma; C/D cup-to-disc ratio; HVF humphrey visual field; GVF goldmann visual field; OCT optical coherence tomography; IOP intraocular pressure; BRVO Branch retinal vein occlusion; CRVO central retinal vein occlusion; CRAO central retinal artery occlusion; BRAO branch retinal artery occlusion; RT retinal tear; SB scleral buckle; PPV pars plana vitrectomy; VH Vitreous hemorrhage; PRP panretinal laser photocoagulation; IVK intravitreal kenalog; VMT vitreomacular traction; MH Macular hole;  NVD neovascularization of the disc; NVE neovascularization elsewhere; AREDS age related eye disease study; ARMD age related macular degeneration; POAG primary open angle glaucoma; EBMD epithelial/anterior basement membrane dystrophy; ACIOL anterior chamber intraocular lens; IOL intraocular lens; PCIOL posterior chamber intraocular lens; Phaco/IOL phacoemulsification with intraocular lens placement; [place] photorefractive keratectomy; LASIK laser assisted in situ keratomileusis; HTN hypertension; DM diabetes mellitus; COPD chronic obstructive pulmonary disease

## 2020-06-20 NOTE — Assessment & Plan Note (Signed)
Secondary to CRV O, improved today week follow-up

## 2020-06-27 DIAGNOSIS — M75111 Incomplete rotator cuff tear or rupture of right shoulder, not specified as traumatic: Secondary | ICD-10-CM | POA: Diagnosis not present

## 2020-08-22 DIAGNOSIS — M0609 Rheumatoid arthritis without rheumatoid factor, multiple sites: Secondary | ICD-10-CM | POA: Diagnosis not present

## 2020-08-22 DIAGNOSIS — Z79899 Other long term (current) drug therapy: Secondary | ICD-10-CM | POA: Diagnosis not present

## 2020-08-22 DIAGNOSIS — M25511 Pain in right shoulder: Secondary | ICD-10-CM | POA: Diagnosis not present

## 2020-08-22 DIAGNOSIS — M1990 Unspecified osteoarthritis, unspecified site: Secondary | ICD-10-CM | POA: Diagnosis not present

## 2020-08-29 ENCOUNTER — Ambulatory Visit (INDEPENDENT_AMBULATORY_CARE_PROVIDER_SITE_OTHER): Payer: Medicare Other | Admitting: Ophthalmology

## 2020-08-29 ENCOUNTER — Other Ambulatory Visit: Payer: Self-pay

## 2020-08-29 ENCOUNTER — Encounter (INDEPENDENT_AMBULATORY_CARE_PROVIDER_SITE_OTHER): Payer: Self-pay | Admitting: Ophthalmology

## 2020-08-29 DIAGNOSIS — H34811 Central retinal vein occlusion, right eye, with macular edema: Secondary | ICD-10-CM | POA: Diagnosis not present

## 2020-08-29 DIAGNOSIS — H35371 Puckering of macula, right eye: Secondary | ICD-10-CM | POA: Diagnosis not present

## 2020-08-29 DIAGNOSIS — H43822 Vitreomacular adhesion, left eye: Secondary | ICD-10-CM | POA: Insufficient documentation

## 2020-08-29 MED ORDER — BEVACIZUMAB 2.5 MG/0.1ML IZ SOSY
2.5000 mg | PREFILLED_SYRINGE | INTRAVITREAL | Status: AC | PRN
  Administered 2020-08-29: 2.5 mg via INTRAVITREAL

## 2020-08-29 NOTE — Assessment & Plan Note (Signed)
Minor physiologic OS

## 2020-08-29 NOTE — Assessment & Plan Note (Addendum)
Hemi central retinal vein occlusion right eye with CME, history of recurrences with longer than 67-month evaluation.  We will repeat today at 10 weeks as CME has been controlled.  Follow-up again scheduled in 10 weeks evaluate OD

## 2020-08-29 NOTE — Progress Notes (Signed)
08/29/2020     CHIEF COMPLAINT Patient presents for Retina Follow Up (10 weeks FU OD and Avastin OD/Pt states VA OU stable since last visit. Pt denies FOL, floaters, or ocular pain OU. /)   HISTORY OF PRESENT ILLNESS: Jorge [name] is a 75 y.o. male who presents to the clinic today for:   HPI     Retina Follow Up           Diagnosis: CRVO/BRVO   Laterality: right eye   Onset: 10 weeks ago   Severity: mild   Duration: 10 weeks   Course: stable   Comments: 10 weeks FU OD and Avastin OD Pt states VA OU stable since last visit. Pt denies FOL, floaters, or ocular pain OU.         Last edited by Kendra Opitz, COA on 08/29/2020  1:52 PM.      Referring physician: Deland Pretty, MD [place],  [place] [address]  HISTORICAL INFORMATION:   Selected notes from the MEDICAL RECORD NUMBER    Lab Results  Component Value Date   HGBA1C 5.7 (H) 10/29/2013     CURRENT MEDICATIONS: No current outpatient medications on file. (Ophthalmic Drugs)   No current facility-administered medications for this visit. (Ophthalmic Drugs)   Current Outpatient Medications (Other)  Medication Sig   amoxicillin (AMOXIL) 500 MG capsule Take 1 capsule by mouth every 6 (six) hours.   aspirin EC 81 MG tablet Take 81 mg by mouth daily.   atorvastatin (LIPITOR) 80 MG tablet Take 40 mg by mouth daily.    ezetimibe (ZETIA) 10 MG tablet Take 10 mg by mouth daily with breakfast.   fish oil-omega-3 fatty acids 1000 MG capsule Take 2 g by mouth daily with breakfast.    metoprolol succinate (TOPROL-XL) 25 MG 24 hr tablet TAKE 1/2 TABLET BY MOUTH EVERY DAY WITH BREAKFAST   Multiple Vitamin (MULTIVITAMIN) capsule Take 1 capsule by mouth daily.     MYRBETRIQ 50 MG TB24 tablet Take 50 mg by mouth daily.   pantoprazole (PROTONIX) 40 MG tablet Take 1 tablet (40 mg total) by mouth daily.   Tofacitinib Citrate (XELJANZ) 5 MG TABS Take 5 mg by mouth daily.   No current  facility-administered medications for this visit. (Other)      REVIEW OF SYSTEMS:    ALLERGIES Allergies  Allergen Reactions   Niacin Itching and Swelling    PAST MEDICAL HISTORY Past Medical History:  Diagnosis Date   Arthritis    CAD (coronary artery disease)    PTCA diagonal 2001, residual 60% LAD /  Nuclear 2006, no ischemia   Carotid artery disease ([place])    Doppler, October, 2012, 0-39% bilateral stenoses.   Diverticulitis    Dyslipidemia    Ejection fraction    EF normal,   Eye abnormalities 08-23-2011   right ryr injection for bleed done by dr Zadie Rhine, dr Zadie Rhine told pt ok to have surgery 09-03-2011   Family history of anesthesia complication    SISTER HAD MEMORY PROBLEMS POST OP   Preop cardiovascular exam    Patient needs cardiac clearance for hernia surgery May, 2013   Psoriatic arthritis ([place])    2012   PVC's (premature ventricular contractions)    Frequent PVCs noted over the years., These always decrease with exercise. This is documented   Umbilical hernia 6/96/2952   Past Surgical History:  Procedure Laterality Date   ABLATION  06/09/2019   ANGIOPLASTY  12/1999 and  03/2000   CARDIAC CATHETERIZATION  2001   carpel tunnel  2009/2010   both wrists   COLONOSCOPY N/A 10/30/2013   Procedure: COLONOSCOPY;  Surgeon: Beryle Beams, MD;  Location: [facility];  Service: Endoscopy;  Laterality: N/A;   ESOPHAGOGASTRODUODENOSCOPY N/A 10/30/2013   Procedure: ESOPHAGOGASTRODUODENOSCOPY (EGD);  Surgeon: Beryle Beams, MD;  Location: [facility] ENDOSCOPY;  Service: Endoscopy;  Laterality: N/A;   INGUINAL HERNIA REPAIR  09/03/2011   Procedure: HERNIA REPAIR INGUINAL ADULT;  Surgeon: Imogene Burn. Georgette Dover, MD;  Location: [facility];  Service: General;  Laterality: Left;  Left Ingunal Hernia Repair with Mesh   PVC ABLATION N/A 06/09/2019   Procedure: PVC ABLATION;  Surgeon: Evans Lance, MD;  Location: [facility];  Service: Cardiovascular;  Laterality: N/A;   right knee meniscus  repair  1610'R   UMBILICAL HERNIA REPAIR  09/03/2011   Procedure: HERNIA REPAIR UMBILICAL ADULT;  Surgeon: Imogene Burn. Tsuei, MD;  Location: [facility];  Service: General;  Laterality: N/A;    FAMILY HISTORY Family History  Problem Relation Age of Onset   Cancer Mother        uterine   Cancer Sister        Breast   Crohn's disease Cousin        distant   Colon cancer Neg Hx    Ulcerative colitis Neg Hx     SOCIAL HISTORY Social History   Tobacco Use   Smoking status: Former    Packs/day: 1.50    Years: 25.00    Pack years: 37.50    Types: Cigarettes    Quit date: 12/08/1994    Years since quitting: 25.7   Smokeless tobacco: Former  Scientific laboratory technician Use: Never used  Substance Use Topics   Alcohol use: Yes    Comment: 2 drink per day   Drug use: No         OPHTHALMIC EXAM:  Base Eye Exam     Visual Acuity (ETDRS)       Right Left   Dist cc 20/30 20/20   Dist ph cc 20/25          Tonometry (Tonopen, 2:00 PM)       Right Left   Pressure 11 11         Pupils       Pupils Dark Light Shape React APD   Right PERRL 5 4 Round Brisk None   Left PERRL 5 4 Round Brisk None         Visual Fields (Counting fingers)       Left Right    Full Full         Extraocular Movement       Right Left    Full Full         Neuro/Psych     Oriented x3: Yes   Mood/Affect: Normal         Dilation     Right eye: 1.0% Mydriacyl, 2.5% Phenylephrine @ 2:00 PM           Slit Lamp and Fundus Exam     External Exam       Right Left   External Normal Normal         Slit Lamp Exam       Right Left   Lids/Lashes Normal Normal   Conjunctiva/Sclera White and quiet White and quiet   Cornea Clear Clear   Anterior Chamber Deep and quiet Deep and  quiet   Iris Round and reactive Round and reactive   Lens Centered posterior chamber intraocular lens 2+ Nuclear sclerosis   Anterior Vitreous Normal Normal         Fundus Exam       Right Left    Posterior Vitreous Posterior vitreous detachment, Central vitreous floaters    Disc Collaterals    C/D Ratio 0.35    Macula Microaneurysms, no cystoid macular edema    Vessels Vein occlusion partially compensated with collaterals on the nerve    Periphery Normal             IMAGING AND PROCEDURES  Imaging and Procedures for 08/29/20  OCT, Retina - OU - Both Eyes       Right Eye Quality was good. Scan locations included subfoveal. Central Foveal Thickness: 289. Progression has improved. Findings include abnormal foveal contour, central retinal atrophy.   Left Eye Quality was good. Scan locations included subfoveal. Central Foveal Thickness: 294. Progression has been stable. Findings include normal foveal contour, vitreomacular adhesion .   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  Repeat injection today and reevaluate in 10 weeks        Intravitreal Injection, Pharmacologic Agent - OD - Right Eye       Time Out 08/29/2020. 2:50 PM. Confirmed correct patient, procedure, site, and patient consented.   Anesthesia Topical anesthesia was used. Anesthetic medications included [place] 3.5%.   Procedure Preparation included Ofloxacin , 10% betadine to eyelids, 5% betadine to ocular surface. A 30 gauge needle was used.   Injection: 2.5 mg bevacizumab 2.5 MG/0.1ML   Route: Intravitreal   NDC: [phone], Lot: [id number]   Post-op Post injection exam found visual acuity of at least counting fingers. The patient tolerated the procedure well. There were no complications. The patient received written and verbal post procedure care education. Post injection medications were not given.              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  We will repeat today at 10 weeks as CME has been controlled.  Follow-up again scheduled in 10 weeks evaluate OD     ICD-10-CM   1. 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 Macular puckering, right eye  H35.371     3. Vitreomacular adhesion of left eye  H43.822       1.  OD, recurrent CME in the past from [facility] since retinal vein occlusion.  Currently controlled and stabilized with improved acuity at 10-week interval.  Repeat injection today and examination next again in the right eye in 10 weeks  2.  3.  Ophthalmic Meds Ordered this visit:  Meds ordered this encounter  Medications   bevacizumab (AVASTIN) SOSY 2.5 mg       No follow-ups on file.  There are no Patient Instructions on file for this visit.   Explained the diagnoses, plan, and follow up with the patient and  they expressed understanding.  Patient expressed understanding of the importance of proper follow up care.   Clent Demark Haylea Schlichting M.D. Diseases & Surgery of the Retina and Vitreous Retina [facility] Diabetic [place] 08/29/20     Abbreviations: M myopia (nearsighted); A astigmatism; H hyperopia (farsighted); P presbyopia; Mrx spectacle prescription;  CTL contact lenses; OD right eye; OS left eye; OU both eyes  XT exotropia; ET esotropia; PEK punctate epithelial keratitis; PEE punctate epithelial erosions; DES dry eye syndrome; MGD meibomian gland dysfunction; ATs artificial tears; PFAT's preservative free artificial tears; [place] nuclear sclerotic cataract; PSC posterior subcapsular cataract; ERM epi-retinal membrane; PVD posterior vitreous detachment; RD retinal detachment; DM diabetes mellitus; DR  diabetic retinopathy; NPDR non-proliferative diabetic retinopathy; PDR proliferative diabetic retinopathy; CSME clinically significant macular edema; DME diabetic macular edema; dbh dot blot hemorrhages; CWS cotton wool spot; POAG primary open angle glaucoma; C/D cup-to-disc ratio; HVF humphrey visual field; GVF goldmann visual field; OCT optical coherence tomography; IOP intraocular pressure; BRVO Branch retinal vein occlusion; CRVO central retinal vein occlusion; CRAO central retinal artery occlusion; BRAO branch retinal artery occlusion; RT retinal tear; SB scleral buckle; PPV pars plana vitrectomy; VH Vitreous hemorrhage; PRP panretinal laser photocoagulation; IVK intravitreal kenalog; VMT vitreomacular traction; MH Macular hole;  NVD neovascularization of the disc; NVE neovascularization elsewhere; AREDS age related eye disease study; ARMD age related macular degeneration; POAG primary open angle glaucoma; EBMD epithelial/anterior basement membrane dystrophy; ACIOL anterior chamber intraocular lens; IOL intraocular lens; PCIOL posterior chamber intraocular lens; Phaco/IOL phacoemulsification with intraocular lens placement; [place] photorefractive keratectomy; LASIK laser assisted in situ keratomileusis; HTN hypertension; DM diabetes mellitus; COPD chronic obstructive pulmonary disease

## 2020-08-29 NOTE — Assessment & Plan Note (Signed)
Minor epiretinal membrane, no impact on acuity

## 2020-10-04 DIAGNOSIS — L400 Psoriasis vulgaris: Secondary | ICD-10-CM | POA: Diagnosis not present

## 2020-10-04 DIAGNOSIS — L570 Actinic keratosis: Secondary | ICD-10-CM | POA: Diagnosis not present

## 2020-10-04 DIAGNOSIS — L821 Other seborrheic keratosis: Secondary | ICD-10-CM | POA: Diagnosis not present

## 2020-10-04 DIAGNOSIS — D225 Melanocytic nevi of trunk: Secondary | ICD-10-CM | POA: Diagnosis not present

## 2020-11-07 ENCOUNTER — Ambulatory Visit (INDEPENDENT_AMBULATORY_CARE_PROVIDER_SITE_OTHER): Payer: Medicare Other | Admitting: Ophthalmology

## 2020-11-07 ENCOUNTER — Encounter (INDEPENDENT_AMBULATORY_CARE_PROVIDER_SITE_OTHER): Payer: Self-pay | Admitting: Ophthalmology

## 2020-11-07 ENCOUNTER — Other Ambulatory Visit: Payer: Self-pay

## 2020-11-07 DIAGNOSIS — H34811 Central retinal vein occlusion, right eye, with macular edema: Secondary | ICD-10-CM | POA: Diagnosis not present

## 2020-11-07 DIAGNOSIS — H35371 Puckering of macula, right eye: Secondary | ICD-10-CM | POA: Diagnosis not present

## 2020-11-07 MED ORDER — BEVACIZUMAB 2.5 MG/0.1ML IZ SOSY
2.5000 mg | PREFILLED_SYRINGE | INTRAVITREAL | Status: AC | PRN
  Administered 2020-11-07: 2.5 mg via INTRAVITREAL

## 2020-11-07 NOTE — Assessment & Plan Note (Signed)
The nature of central retinal vein occlusion was discussed with the patient including the division of types into nonischemic ischemic. The potential sequelae of ischemic central retinal vein occlusion, including macular edema, neovascularization, rubeosis iridis, and neovascular glaucoma, were discussed, and the need for frequent follow-up.  The nature of macular edema and central retinal vein occlusion was discussed. The following options were considered:  1.Observation for a period to look for spontaneous improvement, is no linger the primary therapy. One-third worsen, one-third stay unchanged, and one-third improves.  2. Anti-VEGF Therapy. ( Lucentis, Avastin or Eylea ) injected  in intravitreal fashion, initially monthly then tailored to clinical response.  3. Intravitreal steroid usage, Kenalog, or Ozurdex, usually a second line therapy or in combination with anti-Vegf therapy noted above.  4. Panretinal laser photocoagulation to cause regression of iris neovascularization, or treat retinal  non-perfusion.  5. Surgical Management may include vitrectomy with incisions of peripheral veins to trigger retino choroidal anastomosis formation. This topic presented and discussed at [place].

## 2020-11-07 NOTE — Assessment & Plan Note (Signed)
Minor epiretinal membrane no impact on acuity

## 2020-11-07 NOTE — Progress Notes (Signed)
11/07/2020     CHIEF COMPLAINT Patient presents for  Chief Complaint  Patient presents with   Retina Follow Up      HISTORY OF PRESENT ILLNESS: Jorge [name] is a 75 y.o. 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  Now has improved again with stable and improving acuity OD currently at 10-week follow-up Pt states VA OU stable since last visit. Pt denies FOL, floaters, or ocular pain OU.        Last edited by Hurman Horn, MD on 11/07/2020  1:28 PM.      Referring physician: Deland Pretty, MD [place],  [place] [address]  HISTORICAL INFORMATION:   Selected notes from the MEDICAL RECORD NUMBER    Lab Results  Component Value Date   HGBA1C 5.7 (H) 10/29/2013     CURRENT MEDICATIONS: No current outpatient medications on file. (Ophthalmic Drugs)   No current facility-administered medications for this visit. (Ophthalmic Drugs)   Current Outpatient Medications (Other)  Medication Sig   amoxicillin (AMOXIL) 500 MG capsule Take 1 capsule by mouth every 6 (six) hours.   aspirin EC 81 MG tablet Take 81 mg by mouth daily.   atorvastatin (LIPITOR) 80 MG tablet Take 40 mg by mouth daily.    ezetimibe (ZETIA) 10 MG tablet Take 10 mg by mouth daily with breakfast.   fish oil-omega-3 fatty acids 1000 MG capsule Take 2 g by mouth daily with breakfast.    metoprolol succinate (TOPROL-XL) 25 MG 24 hr tablet TAKE 1/2 TABLET BY MOUTH EVERY DAY WITH BREAKFAST   Multiple Vitamin (MULTIVITAMIN) capsule Take 1 capsule by mouth daily.     MYRBETRIQ 50 MG TB24 tablet Take 50 mg by mouth daily.   pantoprazole (PROTONIX) 40 MG tablet Take 1 tablet (40 mg  total) by mouth daily.   Tofacitinib Citrate (XELJANZ) 5 MG TABS Take 5 mg by mouth daily.   No current facility-administered medications for this visit. (Other)      REVIEW OF SYSTEMS:    ALLERGIES Allergies  Allergen Reactions   Niacin Itching and Swelling    PAST MEDICAL HISTORY Past Medical History:  Diagnosis Date   Arthritis    CAD (coronary artery disease)    PTCA diagonal 2001, residual 60% LAD /  Nuclear 2006, no ischemia   Carotid artery disease ([place])    Doppler, October, 2012, 0-39% bilateral stenoses.   Diverticulitis    Dyslipidemia    Ejection fraction    EF normal,   Eye abnormalities 08-23-2011   right ryr injection for bleed done by dr Zadie Rhine, dr Zadie Rhine told pt ok to have surgery 09-03-2011   Family history of anesthesia complication    SISTER HAD MEMORY PROBLEMS POST OP   Preop cardiovascular exam    Patient needs cardiac clearance for hernia surgery May, 2013   Psoriatic arthritis ([place])    2012   PVC's (premature ventricular contractions)    Frequent PVCs noted over the years., These always decrease with exercise. This  is documented   Umbilical hernia [id number]   Past Surgical History:  Procedure Laterality Date   ABLATION  06/09/2019   ANGIOPLASTY  12/1999 and 03/2000   CARDIAC CATHETERIZATION  2001   carpel tunnel  2009/2010   both wrists   COLONOSCOPY N/A 10/30/2013   Procedure: COLONOSCOPY;  Surgeon: Beryle Beams, MD;  Location: [facility];  Service: Endoscopy;  Laterality: N/A;   ESOPHAGOGASTRODUODENOSCOPY N/A 10/30/2013   Procedure: ESOPHAGOGASTRODUODENOSCOPY (EGD);  Surgeon: Beryle Beams, MD;  Location[facility] ENDOSCOPY;  Service: Endoscopy;  Laterality: N/A;   INGUINAL HERNIA REPAIR  09/03/2011   Procedure: HERNIA REPAIR INGUINAL ADULT;  Surgeon: Imogene Burn. Georgette Dover, MD;  Location: [facility];  Service: General;  Laterality: Left;  Left Ingunal Hernia Repair with Mesh   PVC ABLATION N/A 06/09/2019   Procedure: PVC ABLATION;  Surgeon: Evans Lance, MD;  Location: [facility];  Service: Cardiovascular;  Laterality: N/A;   right knee meniscus repair  [id number]   UMBILICAL HERNIA REPAIR  09/03/2011   Procedure: HERNIA REPAIR UMBILICAL ADULT;  Surgeon: Imogene Burn. Tsuei, MD;  Location: [facility];  Service: General;  Laterality: N/A;    FAMILY HISTORY Family History  Problem Relation Age of Onset   Cancer Mother        uterine   Cancer Sister        Breast   Crohn's disease Cousin        distant   Colon cancer Neg Hx    Ulcerative colitis Neg Hx     SOCIAL HISTORY Social History   Tobacco Use   Smoking status: Former    Packs/day: 1.50    Years: 25.00    Pack years: 37.50    Types: Cigarettes    Quit date: 12/08/1994    Years since quitting: 25.9   Smokeless tobacco: Former  Scientific laboratory technician Use: Never used  Substance Use Topics   Alcohol use: Yes    Comment: 2 drink per day   Drug use: No         OPHTHALMIC EXAM:  Base Eye Exam     Visual Acuity (ETDRS)       Right Left   Dist cc 20/50 -2 20/20   Dist ph cc 20/30 -1          Tonometry (Tonopen, 1:13 PM)       Right Left   Pressure 15 13         Pupils       Pupils Dark Light Shape React APD   Right PERRL 5 4 Round Brisk None   Left PERRL 5 4 Round Brisk None         Visual Fields (Counting fingers)       Left Right    Full Full         Extraocular Movement       Right Left    Full Full         Neuro/Psych     Oriented x3: Yes   Mood/Affect: Normal         Dilation     Right eye: 1.0% Mydriacyl, 2.5% Phenylephrine @ 1:13 PM           Slit Lamp and Fundus Exam     External Exam       Right Left   External Normal Normal         Slit Lamp Exam       Right  Left   Lids/Lashes Normal Normal   Conjunctiva/Sclera White and quiet White and quiet   Cornea Clear Clear   Anterior Chamber Deep and quiet Deep and quiet   Iris Round and reactive Round and reactive   Lens Centered posterior chamber  intraocular lens 2+ Nuclear sclerosis   Anterior Vitreous Normal Normal         Fundus Exam       Right Left   Posterior Vitreous Posterior vitreous detachment, Central vitreous floaters    Disc Collaterals    C/D Ratio 0.25    Macula Microaneurysms, no cystoid macular edema    Vessels Vein occlusion partially compensated with collaterals on the nerve    Periphery Normal             IMAGING AND PROCEDURES  Imaging and Procedures for 11/07/20  OCT, Retina - OU - Both Eyes       Right Eye Quality was good. Scan locations included subfoveal. Central Foveal Thickness: 277. Progression has improved. Findings include abnormal foveal contour, central retinal atrophy.   Left Eye Quality was good. Scan locations included subfoveal. Central Foveal Thickness: 295. Progression has been stable. Findings include normal foveal contour, vitreomacular adhesion .   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  Repeat injection today and reevaluate in 12 weeks        Intravitreal Injection, Pharmacologic Agent - OD - Right Eye       Time Out 11/07/2020. 1:26 PM. Confirmed correct patient, procedure, site, and patient consented.   Anesthesia Topical anesthesia was used. Anesthetic medications included [place] 3.5%.   Procedure Preparation included Ofloxacin , 10% betadine to eyelids, 5% betadine to ocular surface. A 30 gauge needle was used.   Injection: 2.5 mg bevacizumab 2.5 MG/0.1ML   Route: Intravitreal, Site: Right Eye   NDC: [phone]   Post-op Post injection exam found visual acuity of at least counting fingers. The patient tolerated the procedure well. There were no complications. The patient received written and verbal post procedure care education. Post injection medications were not given.              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 The potential sequelae of ischemic central retinal vein occlusion, including macular edema, neovascularization, rubeosis iridis, and neovascular glaucoma, were discussed, and the need for frequent follow-up.  The nature of macular edema and central retinal vein occlusion was discussed. The following options were considered:  1.Observation for a period to look for spontaneous improvement, is no linger the primary therapy. One-third worsen, one-third stay unchanged, and one-third improves.  2. Anti-VEGF Therapy. ( Lucentis, Avastin or Eylea ) injected  in intravitreal fashion, initially monthly then tailored to clinical response.  3. Intravitreal steroid usage, Kenalog, or Ozurdex, usually a second line therapy or in combination with anti-Vegf therapy noted above.  4. Panretinal laser photocoagulation to cause regression of iris neovascularization, or treat retinal  non-perfusion.  5. Surgical Management may include vitrectomy with incisions of peripheral veins to trigger retino choroidal anastomosis formation. This topic presented and discussed at [place].     Macular puckering, right eye Minor epiretinal membrane no impact on acuity      ICD-10-CM   1. Central retinal vein occlusion with macular edema  of right eye  H34.8110 OCT, Retina - OU - Both Eyes    Intravitreal Injection, Pharmacologic Agent - OD - Right Eye    bevacizumab (AVASTIN) SOSY 2.5 mg    2. Macular puckering, right eye  H35.371       1.  OD looks great again, much less CME and macular thickening at 10-week follow-up post Avastin we will repeat injection today and follow-up next in 3 months  2.  3.  Ophthalmic Meds Ordered this visit:  Meds ordered this encounter  Medications   bevacizumab (AVASTIN) SOSY 2.5 mg       Return in about 3 months  (around 02/07/2021) for dilate, OD, AVASTIN OCT.  There are no Patient Instructions on file for this visit.   Explained the diagnoses, plan, and follow up with the patient and they expressed understanding.  Patient expressed understanding of the importance of proper follow up care.   Clent Demark Xylina Rhoads M.D. Diseases & Surgery of the Retina and Vitreous Retina & Diabetic [place] 11/07/20     Abbreviations: M myopia (nearsighted); A astigmatism; H hyperopia (farsighted); P presbyopia; Mrx spectacle prescription;  CTL contact lenses; OD right eye; OS left eye; OU both eyes  XT exotropia; ET esotropia; PEK punctate epithelial keratitis; PEE punctate epithelial erosions; DES dry eye syndrome; MGD meibomian gland dysfunction; ATs artificial tears; PFAT's preservative free artificial tears; [place] nuclear sclerotic cataract; PSC posterior subcapsular cataract; ERM epi-retinal membrane; PVD posterior vitreous detachment; RD retinal detachment; DM diabetes mellitus; DR diabetic retinopathy; NPDR non-proliferative diabetic retinopathy; PDR proliferative diabetic retinopathy; CSME clinically significant macular edema; DME diabetic macular edema; dbh dot blot hemorrhages; CWS cotton wool spot; POAG primary open angle glaucoma; C/D cup-to-disc ratio; HVF humphrey visual field; GVF goldmann visual field; OCT optical coherence tomography; IOP intraocular pressure; BRVO Branch retinal vein occlusion; CRVO central retinal vein occlusion; CRAO central retinal artery occlusion; BRAO branch retinal artery occlusion; RT retinal tear; SB scleral buckle; PPV pars plana vitrectomy; VH Vitreous hemorrhage; PRP panretinal laser photocoagulation; IVK intravitreal kenalog; VMT vitreomacular traction; MH Macular hole;  NVD neovascularization of the disc; NVE neovascularization elsewhere; AREDS age related eye disease study; ARMD age related macular degeneration; POAG primary open angle glaucoma; EBMD epithelial/anterior basement  membrane dystrophy; ACIOL anterior chamber intraocular lens; IOL intraocular lens; PCIOL posterior chamber intraocular lens; Phaco/IOL phacoemulsification with intraocular lens placement; [place] photorefractive keratectomy; LASIK laser assisted in situ keratomileusis; HTN hypertension; DM diabetes mellitus; COPD chronic obstructive pulmonary disease

## 2020-12-15 ENCOUNTER — Encounter (INDEPENDENT_AMBULATORY_CARE_PROVIDER_SITE_OTHER): Payer: Self-pay

## 2020-12-15 DIAGNOSIS — H3562 Retinal hemorrhage, left eye: Secondary | ICD-10-CM | POA: Diagnosis not present

## 2020-12-15 DIAGNOSIS — H34811 Central retinal vein occlusion, right eye, with macular edema: Secondary | ICD-10-CM | POA: Diagnosis not present

## 2020-12-15 DIAGNOSIS — H04123 Dry eye syndrome of bilateral lacrimal glands: Secondary | ICD-10-CM | POA: Diagnosis not present

## 2020-12-15 DIAGNOSIS — H40013 Open angle with borderline findings, low risk, bilateral: Secondary | ICD-10-CM | POA: Diagnosis not present

## 2021-01-06 DIAGNOSIS — N411 Chronic prostatitis: Secondary | ICD-10-CM | POA: Diagnosis not present

## 2021-01-06 DIAGNOSIS — R351 Nocturia: Secondary | ICD-10-CM | POA: Diagnosis not present

## 2021-01-23 DIAGNOSIS — Z23 Encounter for immunization: Secondary | ICD-10-CM | POA: Diagnosis not present

## 2021-02-07 ENCOUNTER — Other Ambulatory Visit: Payer: Self-pay

## 2021-02-07 ENCOUNTER — Ambulatory Visit (INDEPENDENT_AMBULATORY_CARE_PROVIDER_SITE_OTHER): Payer: Medicare Other | Admitting: Ophthalmology

## 2021-02-07 ENCOUNTER — Encounter (INDEPENDENT_AMBULATORY_CARE_PROVIDER_SITE_OTHER): Payer: Self-pay | Admitting: Ophthalmology

## 2021-02-07 DIAGNOSIS — H35351 Cystoid macular degeneration, right eye: Secondary | ICD-10-CM

## 2021-02-07 DIAGNOSIS — H34811 Central retinal vein occlusion, right eye, with macular edema: Secondary | ICD-10-CM | POA: Diagnosis not present

## 2021-02-07 MED ORDER — BEVACIZUMAB 2.5 MG/0.1ML IZ SOSY
2.5000 mg | PREFILLED_SYRINGE | INTRAVITREAL | Status: AC | PRN
  Administered 2021-02-07: 2.5 mg via INTRAVITREAL

## 2021-02-07 NOTE — Progress Notes (Signed)
02/07/2021     CHIEF COMPLAINT Patient presents for  Chief Complaint  Patient presents with   Retina Follow Up      HISTORY OF PRESENT ILLNESS: Jorge [name] is a 75 y.o. male who presents to the clinic today for:   HPI     Retina Follow Up   Patient presents with  CRVO/BRVO.  In right eye.  This started 3 months ago.  Duration of 3 months.  Since onset it is stable.        Comments   3 month f/u OD with OCT and possible Avastin injection      Last edited by Reather Littler, COA on 02/07/2021  1:10 PM.      Referring physician: Lisabeth Pick, MD [address] [place],  [place] [address]  HISTORICAL INFORMATION:   Selected notes from the MEDICAL RECORD NUMBER    Lab Results  Component Value Date   HGBA1C 5.7 (H) 10/29/2013     CURRENT MEDICATIONS: No current outpatient medications on file. (Ophthalmic Drugs)   No current facility-administered medications for this visit. (Ophthalmic Drugs)   Current Outpatient Medications (Other)  Medication Sig   amoxicillin (AMOXIL) 500 MG capsule Take 1 capsule by mouth every 6 (six) hours.   aspirin EC 81 MG tablet Take 81 mg by mouth daily.   atorvastatin (LIPITOR) 80 MG tablet Take 40 mg by mouth daily.    ezetimibe (ZETIA) 10 MG tablet Take 10 mg by mouth daily with breakfast.   fish oil-omega-3 fatty acids 1000 MG capsule Take 2 g by mouth daily with breakfast.    metoprolol succinate (TOPROL-XL) 25 MG 24 hr tablet TAKE 1/2 TABLET BY MOUTH EVERY DAY WITH BREAKFAST   Multiple Vitamin (MULTIVITAMIN) capsule Take 1 capsule by mouth daily.     MYRBETRIQ 50 MG TB24 tablet Take 50 mg by mouth daily.   pantoprazole (PROTONIX) 40 MG tablet Take 1 tablet (40 mg total) by mouth daily.   Tofacitinib Citrate (XELJANZ) 5 MG TABS Take 5 mg by mouth daily.   No current facility-administered medications for this visit. (Other)      REVIEW OF SYSTEMS:    ALLERGIES Allergies  Allergen Reactions   Niacin  Itching and Swelling    PAST MEDICAL HISTORY Past Medical History:  Diagnosis Date   Arthritis    CAD (coronary artery disease)    PTCA diagonal 2001, residual 60% LAD /  Nuclear 2006, no ischemia   Carotid artery disease ([place])    Doppler, October, 2012, 0-39% bilateral stenoses.   Diverticulitis    Dyslipidemia    Ejection fraction    EF normal,   Eye abnormalities 08-23-2011   right ryr injection for bleed done by dr Zadie Rhine, dr Zadie Rhine told pt ok to have surgery 09-03-2011   Family history of anesthesia complication    SISTER HAD MEMORY PROBLEMS POST OP   Preop cardiovascular exam    Patient needs cardiac clearance for hernia surgery May, 2013   Psoriatic arthritis ([place])    2012   PVC's (premature ventricular contractions)    Frequent PVCs noted over the years., These always decrease with exercise. This is documented   Umbilical hernia 05/02/863   Past Surgical History:  Procedure Laterality Date   ABLATION  06/09/2019   ANGIOPLASTY  12/1999 and 03/2000   CARDIAC CATHETERIZATION  2001   carpel tunnel  2009/2010   both wrists   COLONOSCOPY N/A 10/30/2013   Procedure: COLONOSCOPY;  Surgeon: Saralyn Pilar  Renee Ramus, MD;  Location: [facility];  Service: Endoscopy;  Laterality: N/A;   ESOPHAGOGASTRODUODENOSCOPY N/A 10/30/2013   Procedure: ESOPHAGOGASTRODUODENOSCOPY (EGD);  Surgeon: Beryle Beams, MD;  Location: [facility] ENDOSCOPY;  Service: Endoscopy;  Laterality: N/A;   INGUINAL HERNIA REPAIR  09/03/2011   Procedure: HERNIA REPAIR INGUINAL ADULT;  Surgeon: Imogene Burn. Georgette Dover, MD;  Location: [facility];  Service: General;  Laterality: Left;  Left Ingunal Hernia Repair with Mesh   PVC ABLATION N/A 06/09/2019   Procedure: PVC ABLATION;  Surgeon: Evans Lance, MD;  Location: [facility];  Service: Cardiovascular;  Laterality: N/A;   right knee meniscus repair  [id number]   UMBILICAL HERNIA REPAIR  09/03/2011   Procedure: HERNIA REPAIR UMBILICAL ADULT;  Surgeon: Imogene Burn. Tsuei, MD;  [facility];  Service: General;  Laterality: N/A;    FAMILY HISTORY Family History  Problem Relation Age of Onset   Cancer Mother        uterine   Cancer Sister        Breast   Crohn's disease Cousin        distant   Colon cancer Neg Hx    Ulcerative colitis Neg Hx     SOCIAL HISTORY Social History   Tobacco Use   Smoking status: Former    Packs/day: 1.50    Years: 25.00    Pack years: 37.50    Types: Cigarettes    Quit date: 12/08/1994    Years since quitting: 26.1   Smokeless tobacco: Former  Scientific laboratory technician Use: Never used  Substance Use Topics   Alcohol use: Yes    Comment: 2 drink per day   Drug use: No         OPHTHALMIC EXAM:  Base Eye Exam     Visual Acuity (ETDRS)       Right Left   Dist cc 20/20 -2 20/20    Correction: Glasses         Tonometry (Tonopen, 1:16 PM)       Right Left   Pressure 15 12         Pupils       Pupils Dark Light Shape React APD   Right PERRL 4 3 Round Brisk None   Left PERRL 4 3 Round Brisk None         Visual Fields (Counting fingers)       Left Right    Full Full         Extraocular Movement       Right Left    Full, Ortho Full, Ortho         Neuro/Psych     Oriented x3: Yes   Mood/Affect: Normal         Dilation     Right eye: 1.0% Mydriacyl, 2.5% Phenylephrine @ 1:14 PM           Slit Lamp and Fundus Exam     External Exam       Right Left   External Normal Normal         Slit Lamp Exam       Right Left   Lids/Lashes Normal Normal   Conjunctiva/Sclera White and quiet White and quiet   Cornea Clear Clear   Anterior Chamber Deep and quiet Deep and quiet   Iris Round and reactive Round and reactive   Lens Centered posterior chamber intraocular lens 2+ Nuclear sclerosis   Anterior Vitreous Normal Normal  Fundus Exam       Right Left   Posterior Vitreous Posterior vitreous detachment, Central vitreous floaters    Disc Collaterals    C/D Ratio 0.25     Macula Microaneurysms, no cystoid macular edema    Vessels Vein occlusion partially compensated with collaterals on the nerve    Periphery Normal             IMAGING AND PROCEDURES  Imaging and Procedures for 02/07/21  OCT, Retina - OU - Both Eyes       Right Eye Quality was good. Scan locations included subfoveal. Central Foveal Thickness: 262. Progression has improved. Findings include abnormal foveal contour, central retinal atrophy.   Left Eye Quality was good. Scan locations included subfoveal. Central Foveal Thickness: 292. Progression has been stable. Findings include normal foveal contour, vitreomacular adhesion .   Notes Much improved CME today OD secondary to Hemi central retinal vein occlusion right eye chronic and recurrent currently at 12-week follow-up.  Repeat injection today and reevaluate in 3 months        Intravitreal Injection, Pharmacologic Agent - OD - Right Eye       Time Out 02/07/2021. 1:38 PM. Confirmed correct patient, procedure, site, and patient consented.   Anesthesia Topical anesthesia was used. Anesthetic medications included Lidocaine 4%.   Procedure Preparation included Ofloxacin , 10% betadine to eyelids, 5% betadine to ocular surface. A 30 gauge needle was used.   Injection: 2.5 mg bevacizumab 2.5 MG/0.1ML   Route: Intravitreal, Site: Right Eye   NDC: [phone], Lot: [id number]   Post-op Post injection exam found visual acuity of at least counting fingers. The patient tolerated the procedure well. There were no complications. The patient received written and verbal post procedure care education. Post injection medications were not given.              ASSESSMENT/PLAN:  Central retinal vein occlusion with macular edema of right eye The nature of central retinal vein occlusion was discussed with the patient including the division of types into nonischemic ischemic. The potential sequelae of ischemic central retinal vein  occlusion, including macular edema, neovascularization, rubeosis iridis, and neovascular glaucoma, were discussed, and the need for frequent follow-up.  The nature of macular edema and central retinal vein occlusion was discussed. The following options were considered:  1.Observation for a period to look for spontaneous improvement, is no linger the primary therapy. One-third worsen, one-third stay unchanged, and one-third improves.  2. Anti-VEGF Therapy. ( Lucentis, Avastin or Eylea ) injected  in intravitreal fashion, initially monthly then tailored to clinical response.  3. Intravitreal steroid usage, Kenalog, or Ozurdex, usually a second line therapy or in combination with anti-Vegf therapy noted above.  4. Panretinal laser photocoagulation to cause regression of iris neovascularization, or treat retinal  non-perfusion.  5. Surgical Management may include vitrectomy with incisions of peripheral veins to trigger retino choroidal anastomosis formation. This topic presented and discussed at [place].   Cystoid macular edema of right eye OD currently at 2-month follow-up, much less CME      ICD-10-CM   1. 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 Cystoid macular edema of right eye  H35.351       OD with center involved CME vastly improved today still at 91-month  follow-up from [place] O.  Good acuity preserved.  Repeat injection today we will maintain 34-month follow-up with a recent history of recurrences  2.  3.  Ophthalmic Meds Ordered this visit:  Meds ordered this encounter  Medications   bevacizumab (AVASTIN) SOSY 2.5 mg       Return in about 3 months (around 05/10/2021) for DILATE OU, AVASTIN OCT, OD.  There are no Patient Instructions on file for this visit.   Explained the diagnoses, plan, and follow up  with the patient and they expressed understanding.  Patient expressed understanding of the importance of proper follow up care.   Clent Demark Lilyauna Miedema M.D. Diseases & Surgery of the Retina and Vitreous Retina & Diabetic [place] 02/07/21     Abbreviations: M myopia (nearsighted); A astigmatism; H hyperopia (farsighted); P presbyopia; Mrx spectacle prescription;  CTL contact lenses; OD right eye; OS left eye; OU both eyes  XT exotropia; ET esotropia; PEK punctate epithelial keratitis; PEE punctate epithelial erosions; DES dry eye syndrome; MGD meibomian gland dysfunction; ATs artificial tears; PFAT's preservative free artificial tears; [place] nuclear sclerotic cataract; PSC posterior subcapsular cataract; ERM epi-retinal membrane; PVD posterior vitreous detachment; RD retinal detachment; DM diabetes mellitus; DR diabetic retinopathy; NPDR non-proliferative diabetic retinopathy; PDR proliferative diabetic retinopathy; CSME clinically significant macular edema; DME diabetic macular edema; dbh dot blot hemorrhages; CWS cotton wool spot; POAG primary open angle glaucoma; C/D cup-to-disc ratio; HVF humphrey visual field; GVF goldmann visual field; OCT optical coherence tomography; IOP intraocular pressure; BRVO Branch retinal vein occlusion; CRVO central retinal vein occlusion; CRAO central retinal artery occlusion; BRAO branch retinal artery occlusion; RT retinal tear; SB scleral buckle; PPV pars plana vitrectomy; VH Vitreous hemorrhage; PRP panretinal laser photocoagulation; IVK intravitreal kenalog; VMT vitreomacular traction; MH Macular hole;  NVD neovascularization of the disc; NVE neovascularization elsewhere; AREDS age related eye disease study; ARMD age related macular degeneration; POAG primary open angle glaucoma; EBMD epithelial/anterior basement membrane dystrophy; ACIOL anterior chamber intraocular lens; IOL intraocular lens; PCIOL posterior chamber intraocular lens; Phaco/IOL phacoemulsification with  intraocular lens placement; [place] photorefractive keratectomy; LASIK laser assisted in situ keratomileusis; HTN hypertension; DM diabetes mellitus; COPD chronic obstructive pulmonary disease

## 2021-02-07 NOTE — Assessment & Plan Note (Signed)
OD currently at 27-month follow-up, much less CME

## 2021-02-07 NOTE — Assessment & Plan Note (Signed)
The nature of central retinal vein occlusion was discussed with the patient including the division of types into nonischemic ischemic. The potential sequelae of ischemic central retinal vein occlusion, including macular edema, neovascularization, rubeosis iridis, and neovascular glaucoma, were discussed, and the need for frequent follow-up.  The nature of macular edema and central retinal vein occlusion was discussed. The following options were considered:  1.Observation for a period to look for spontaneous improvement, is no linger the primary therapy. One-third worsen, one-third stay unchanged, and one-third improves.  2. Anti-VEGF Therapy. ( Lucentis, Avastin or Eylea ) injected  in intravitreal fashion, initially monthly then tailored to clinical response.  3. Intravitreal steroid usage, Kenalog, or Ozurdex, usually a second line therapy or in combination with anti-Vegf therapy noted above.  4. Panretinal laser photocoagulation to cause regression of iris neovascularization, or treat retinal  non-perfusion.  5. Surgical Management may include vitrectomy with incisions of peripheral veins to trigger retino choroidal anastomosis formation. This topic presented and discussed at [place].

## 2021-02-15 DIAGNOSIS — M1990 Unspecified osteoarthritis, unspecified site: Secondary | ICD-10-CM | POA: Diagnosis not present

## 2021-02-15 DIAGNOSIS — E785 Hyperlipidemia, unspecified: Secondary | ICD-10-CM | POA: Diagnosis not present

## 2021-02-15 DIAGNOSIS — I10 Essential (primary) hypertension: Secondary | ICD-10-CM | POA: Diagnosis not present

## 2021-02-15 DIAGNOSIS — M0609 Rheumatoid arthritis without rheumatoid factor, multiple sites: Secondary | ICD-10-CM | POA: Diagnosis not present

## 2021-02-21 DIAGNOSIS — M1990 Unspecified osteoarthritis, unspecified site: Secondary | ICD-10-CM | POA: Diagnosis not present

## 2021-02-21 DIAGNOSIS — M5030 Other cervical disc degeneration, unspecified cervical region: Secondary | ICD-10-CM | POA: Diagnosis not present

## 2021-02-21 DIAGNOSIS — M0609 Rheumatoid arthritis without rheumatoid factor, multiple sites: Secondary | ICD-10-CM | POA: Diagnosis not present

## 2021-02-21 DIAGNOSIS — Z79899 Other long term (current) drug therapy: Secondary | ICD-10-CM | POA: Diagnosis not present

## 2021-03-24 DIAGNOSIS — I10 Essential (primary) hypertension: Secondary | ICD-10-CM | POA: Diagnosis not present

## 2021-03-24 DIAGNOSIS — Z125 Encounter for screening for malignant neoplasm of prostate: Secondary | ICD-10-CM | POA: Diagnosis not present

## 2021-03-27 DIAGNOSIS — I2510 Atherosclerotic heart disease of native coronary artery without angina pectoris: Secondary | ICD-10-CM | POA: Diagnosis not present

## 2021-03-27 DIAGNOSIS — I779 Disorder of arteries and arterioles, unspecified: Secondary | ICD-10-CM | POA: Diagnosis not present

## 2021-03-27 DIAGNOSIS — Z1212 Encounter for screening for malignant neoplasm of rectum: Secondary | ICD-10-CM | POA: Diagnosis not present

## 2021-03-27 DIAGNOSIS — I10 Essential (primary) hypertension: Secondary | ICD-10-CM | POA: Diagnosis not present

## 2021-03-27 DIAGNOSIS — Z0000 Encounter for general adult medical examination without abnormal findings: Secondary | ICD-10-CM | POA: Diagnosis not present

## 2021-04-06 ENCOUNTER — Encounter: Payer: Self-pay | Admitting: Family Medicine

## 2021-04-17 ENCOUNTER — Ambulatory Visit: Payer: Medicare Other | Admitting: Family Medicine

## 2021-04-17 NOTE — Progress Notes (Signed)
I, [name], LAT, ATC, am serving as scribe for Dr. Lynne Leader.  Jorge [name] is a 76 y.o. male who presents to [place] at [facility] today for R shoulder pain x one year after slipping and falling on some ice, landing on his R side.  He locates his pain to his R superior-lateral shoulder.  He has been seen at [facility] in the past for his R shoulder and had an injection (thought to be subacromial). The injection was only helpful for a few weeks.  The pain started after he slipped on ice in January 2022.  He then got knocked over by a bull a few months later and then followed up with Dr. Delilah Shan at [facility] where he received his care.  Radiating pain: no Neck pain: yes R shoulder mechanical symptoms: no Aggravating factors: end-range overhead AROM; R shoulder functional IR and horiz aDd Treatments tried:oral NSAIDs; prior steroid injection; accupunture    Pertinent review of systems: No fevers or chills  Relevant historical information: Either rheumatoid arthritis or psoriatic arthritis treated with rheumatology.   Exam:  BP (!) 148/82 (BP Location: Left Arm, Patient Position: Sitting, Cuff Size: Normal)    Pulse 76    Ht 5\' 10"  (1.778 m)    Wt 228 lb 6.4 oz (103.6 kg)    SpO2 95%    BMI 32.77 kg/m  General: Well Developed, well nourished, and in no acute distress.   MSK:  Right shoulder normal-appearing Range of motion: Abduction full.  Internal rotation posterior iliac crest.  External rotation full. Strength intact. Mildly positive Hawkins and Neer's test. Negative empty can test. Strongly positive crossover arm compression test. Negative Yergason's and speeds test. Pulses cap refill and sensation are intact distally. Tender palpation at [facility] joint.    Lab and Radiology Results  Procedure: Real-time Ultrasound Guided Injection of right AC joint superior approach Device: Philips Affiniti 50G Images permanently stored and available  for review in PACS Ultrasound evaluation prior to injection reveals intact rotator cuff tendons with AC joint effusion and degeneration of AC joint present. Verbal informed consent obtained.  Discussed risks and benefits of procedure. Warned about infection bleeding damage to structures skin hypopigmentation and fat atrophy among others. Patient expresses understanding and agreement Time-out conducted.   Noted no overlying erythema, induration, or other signs of local infection.   Skin prepped in a sterile fashion.   Local anesthesia: Topical Ethyl chloride.   With sterile technique and under real time ultrasound guidance: 20 mg of Kenalog and 0.5 mL of Marcaine injected into AC joint. Fluid seen entering the joint capsule.   Completed without difficulty   Pain moderately resolved suggesting accurate placement of the medication.   Advised to call if fevers/chills, erythema, induration, drainage, or persistent bleeding.   Images permanently stored and available for review in the ultrasound unit.  Impression: Technically successful ultrasound guided injection.    X-ray images right shoulder obtained today personally and independently interpreted Mild glenohumeral DJD.  Moderate AC DJD.  No acute fractures are present. Await formal radiology review     Assessment and Plan: 76 y.o. male with right shoulder pain multifactorial.  Patient has had pain for over a year now since a slip and fall injury on the ice in January 2022.  This was compounded when he got knocked over by a bull.  His shoulder pain today and I think is due to [facility] DJD but given his incomplete response to a  diagnostic and therapeutic AC injection I think some of his pain is probably subacromial related and probably glenohumeral related.  Plan for [facility] injection and physical therapy referral and check back in a month. He lives in [place] so when asked where he should go to physical therapy given list of options he picked  [facility].   PDMP not reviewed this encounter. Orders Placed This Encounter  Procedures   [place] LIMITED JOINT SPACE STRUCTURES UP RIGHT(NO LINKED CHARGES)    Order Specific Question:   Reason for Exam (SYMPTOM  OR DIAGNOSIS REQUIRED)    Answer:   R shoulder pain    Order Specific Question:   Preferred imaging location?    Answer:   [place]   DG Shoulder Right    Standing Status:   Future    Number of Occurrences:   1    Standing Expiration Date:   05/16/2021    Order Specific Question:   Reason for Exam (SYMPTOM  OR DIAGNOSIS REQUIRED)    Answer:   R shoulder pain    Order Specific Question:   Preferred imaging location?    Answer:   [facility]   Ambulatory referral to Physical Therapy    Referral Priority:   Routine    Referral Type:   Physical Medicine    Referral Reason:   Specialty Services Required    Requested Specialty:   Physical Therapy    Number of Visits Requested:   1   No orders of the defined types were placed in this encounter.    Discussed warning signs or symptoms. Please see discharge instructions. Patient expresses understanding.   The above documentation has been reviewed and is accurate and complete Lynne Leader, M.D.

## 2021-04-18 ENCOUNTER — Encounter: Payer: Self-pay | Admitting: Family Medicine

## 2021-04-18 ENCOUNTER — Ambulatory Visit (INDEPENDENT_AMBULATORY_CARE_PROVIDER_SITE_OTHER): Payer: Medicare Other

## 2021-04-18 ENCOUNTER — Ambulatory Visit: Payer: Self-pay

## 2021-04-18 ENCOUNTER — Other Ambulatory Visit: Payer: Self-pay | Admitting: Physical Therapy

## 2021-04-18 ENCOUNTER — Other Ambulatory Visit: Payer: Self-pay

## 2021-04-18 ENCOUNTER — Ambulatory Visit: Payer: Medicare Other | Admitting: Family Medicine

## 2021-04-18 VITALS — BP 148/82 | HR 76 | Ht 70.0 in | Wt 228.4 lb

## 2021-04-18 DIAGNOSIS — M25511 Pain in right shoulder: Secondary | ICD-10-CM

## 2021-04-18 DIAGNOSIS — G8929 Other chronic pain: Secondary | ICD-10-CM

## 2021-04-18 NOTE — Patient Instructions (Addendum)
Nice to meet you today.  You had a R shoulder (AC joint) injection.  Call or go to the ER if you develop a large red swollen joint with extreme pain or oozing puss.   I've referred you to Physical Therapy.  Their office will call you to schedule but please let us know if you don't hear from them in one week regarding scheduling.  Follow-up: 4 weeks

## 2021-04-19 NOTE — Progress Notes (Signed)
Right shoulder x-ray shows some arthritis changes but otherwise looks okay to radiology.

## 2021-05-01 ENCOUNTER — Encounter (INDEPENDENT_AMBULATORY_CARE_PROVIDER_SITE_OTHER): Payer: Medicare Other | Admitting: Ophthalmology

## 2021-05-08 ENCOUNTER — Other Ambulatory Visit: Payer: Self-pay

## 2021-05-08 ENCOUNTER — Ambulatory Visit (INDEPENDENT_AMBULATORY_CARE_PROVIDER_SITE_OTHER): Payer: Medicare Other | Admitting: Ophthalmology

## 2021-05-08 ENCOUNTER — Encounter (INDEPENDENT_AMBULATORY_CARE_PROVIDER_SITE_OTHER): Payer: Self-pay | Admitting: Ophthalmology

## 2021-05-08 DIAGNOSIS — Z961 Presence of intraocular lens: Secondary | ICD-10-CM

## 2021-05-08 DIAGNOSIS — H34811 Central retinal vein occlusion, right eye, with macular edema: Secondary | ICD-10-CM | POA: Diagnosis not present

## 2021-05-08 DIAGNOSIS — H35371 Puckering of macula, right eye: Secondary | ICD-10-CM | POA: Diagnosis not present

## 2021-05-08 DIAGNOSIS — H43822 Vitreomacular adhesion, left eye: Secondary | ICD-10-CM | POA: Diagnosis not present

## 2021-05-08 DIAGNOSIS — H35351 Cystoid macular degeneration, right eye: Secondary | ICD-10-CM

## 2021-05-08 MED ORDER — BEVACIZUMAB 2.5 MG/0.1ML IZ SOSY
2.5000 mg | PREFILLED_SYRINGE | INTRAVITREAL | Status: AC | PRN
  Administered 2021-05-08: 2.5 mg via INTRAVITREAL

## 2021-05-08 NOTE — Assessment & Plan Note (Signed)
Minor OD, no impact on acuity

## 2021-05-08 NOTE — Assessment & Plan Note (Signed)
Hemi CRV O, with chronic recurrent CME yet out to 12.6 weeks at this date.  Repeat injection today and follow-up next in 14 weeks to confirm that CME will not recur significantly

## 2021-05-08 NOTE — Assessment & Plan Note (Signed)
Stable OU, no PC opacity noted

## 2021-05-08 NOTE — Assessment & Plan Note (Signed)
Minor physiologic and progression to PVD soon appears

## 2021-05-08 NOTE — Progress Notes (Signed)
05/08/2021     CHIEF COMPLAINT Patient presents for  Chief Complaint  Patient presents with   Retina Evaluation      HISTORY OF PRESENT ILLNESS: Jorge [name] is a 76 y.o. male who presents to the clinic today for:   HPI   CME.  Today at 66-month follow-up.  No interval change in acuity   Last edited by Hurman Horn, MD on 05/08/2021  1:26 PM.      Referring physician: Deland Pretty, MD [address] [place],  [place] [address]  HISTORICAL INFORMATION:   Selected notes from the MEDICAL RECORD NUMBER    Lab Results  Component Value Date   HGBA1C 5.7 (H) 10/29/2013     CURRENT MEDICATIONS: No current outpatient medications on file. (Ophthalmic Drugs)   No current facility-administered medications for this visit. (Ophthalmic Drugs)   Current Outpatient Medications (Other)  Medication Sig   amoxicillin (AMOXIL) 500 MG capsule Take 1 capsule by mouth every 6 (six) hours.   aspirin EC 81 MG tablet Take 81 mg by mouth daily.   atorvastatin (LIPITOR) 80 MG tablet Take 40 mg by mouth daily.    ezetimibe (ZETIA) 10 MG tablet Take 10 mg by mouth daily with breakfast.   fish oil-omega-3 fatty acids 1000 MG capsule Take 2 g by mouth daily with breakfast.    metoprolol succinate (TOPROL-XL) 25 MG 24 hr tablet TAKE 1/2 TABLET BY MOUTH EVERY DAY WITH BREAKFAST   Multiple Vitamin (MULTIVITAMIN) capsule Take 1 capsule by mouth daily.     MYRBETRIQ 50 MG TB24 tablet Take 50 mg by mouth daily.   pantoprazole (PROTONIX) 40 MG tablet Take 1 tablet (40 mg total) by mouth daily.   Tofacitinib Citrate 5 MG TABS Take 5 mg by mouth daily.   No current facility-administered medications for this visit. (Other)      REVIEW OF SYSTEMS: ROS   Negative for: Constitutional, Gastrointestinal, Neurological, Skin, Genitourinary, Musculoskeletal, HENT, Endocrine, Cardiovascular, Eyes, Respiratory, Psychiatric, Allergic/Imm, Heme/Lymph Last edited by Hurman Horn, MD on  05/08/2021  1:26 PM.       ALLERGIES Allergies  Allergen Reactions   Niacin Itching and Swelling    PAST MEDICAL HISTORY Past Medical History:  Diagnosis Date   Arthritis    CAD (coronary artery disease)    PTCA diagonal 2001, residual 60% LAD /  Nuclear 2006, no ischemia   Carotid artery disease ([place])    Doppler, October, 2012, 0-39% bilateral stenoses.   Diverticulitis    Dyslipidemia    Ejection fraction    EF normal,   Eye abnormalities 08-23-2011   right ryr injection for bleed done by dr Zadie Rhine, dr Zadie Rhine told pt ok to have surgery 09-03-2011   Family history of anesthesia complication    SISTER HAD MEMORY PROBLEMS POST OP   Nuclear sclerotic cataract of left eye 09/23/2019   The nature of cataract was discussed with the patient as well as the elective nature of surgery. The patient was reassured that surgery at a later date does not put the patient at risk for a worse outcome. It was emphasized that the need for surgery is dictated by the patient's quality of life as influenced by the cataract. Patient was instructed to maintain close follow up with their general eye    Preop cardiovascular exam    Patient needs cardiac clearance for hernia surgery May, 2013   Psoriatic arthritis ([place])    2012   PVC's (premature ventricular  contractions)    Frequent PVCs noted over the years., These always decrease with exercise. This is documented   Umbilical hernia 3/82/5053   Past Surgical History:  Procedure Laterality Date   ABLATION  06/09/2019   ANGIOPLASTY  12/1999 and 03/2000   CARDIAC CATHETERIZATION  2001   carpel tunnel  2009/2010   both wrists   COLONOSCOPY N/A 10/30/2013   Procedure: COLONOSCOPY;  Surgeon: Beryle Beams, MD;  Location: [place];  Service: Endoscopy;  Laterality: N/A;   ESOPHAGOGASTRODUODENOSCOPY N/A 10/30/2013   Procedure: ESOPHAGOGASTRODUODENOSCOPY (EGD);  Surgeon: Beryle Beams, MD;  Location: [facility];  Service: Endoscopy;  Laterality: N/A;    INGUINAL HERNIA REPAIR  09/03/2011   Procedure: HERNIA REPAIR INGUINAL ADULT;  Surgeon: Imogene Burn. Georgette Dover, MD;  Location: [facility];  Service: General;  Laterality: Left;  Left Ingunal Hernia Repair with Mesh   PVC ABLATION N/A 06/09/2019   Procedure: PVC ABLATION;  Surgeon: Evans Lance, MD;  Location: [facility];  Service: Cardiovascular;  Laterality: N/A;   right knee meniscus repair  9767'H   UMBILICAL HERNIA REPAIR  09/03/2011   Procedure: HERNIA REPAIR UMBILICAL ADULT;  Surgeon: Imogene Burn. Tsuei, MD;  Location: [facility];  Service: General;  Laterality: N/A;    FAMILY HISTORY Family History  Problem Relation Age of Onset   Cancer Mother        uterine   Cancer Sister        Breast   Crohn's disease Cousin        distant   Colon cancer Neg Hx    Ulcerative colitis Neg Hx     SOCIAL HISTORY Social History   Tobacco Use   Smoking status: Former    Packs/day: 1.50    Years: 25.00    Pack years: 37.50    Types: Cigarettes    Quit date: 12/08/1994    Years since quitting: 26.4   Smokeless tobacco: Former  Scientific laboratory technician Use: Never used  Substance Use Topics   Alcohol use: Yes    Comment: 2 drink per day   Drug use: No         OPHTHALMIC EXAM:  Base Eye Exam     Visual Acuity (ETDRS)       Right Left   Dist [place] 20/40 20/25   Dist cc 20/40 20/25   Dist ph cc 20/30 +2          Tonometry (Tonopen, 1:28 PM)       Right Left   Pressure 11 13         Pupils       Pupils   Right PERRL   Left PERRL         Visual Fields       Left Right    Full Full         Extraocular Movement       Right Left    Full, Ortho Full, Ortho         Neuro/Psych     Oriented x3: Yes   Mood/Affect: Normal         Dilation     Right eye: 1.0% Mydriacyl, 2.5% Phenylephrine @ 1:26 PM           Slit Lamp and Fundus Exam     External Exam       Right Left   External Normal Normal         Slit Lamp Exam  Right Left    Lids/Lashes Normal Normal   Conjunctiva/Sclera White and quiet White and quiet   Cornea Clear Clear   Anterior Chamber Deep and quiet Deep and quiet   Iris Round and reactive Round and reactive   Lens Centered posterior chamber intraocular lens 2+ Nuclear sclerosis   Anterior Vitreous Normal Normal         Fundus Exam       Right Left   Posterior Vitreous Posterior vitreous detachment, Central vitreous floaters    Disc Collaterals    C/D Ratio 0.25    Macula Microaneurysms, no cystoid macular edema    Vessels Vein occlusion partially compensated with collaterals on the nerve    Periphery Normal             IMAGING AND PROCEDURES  Imaging and Procedures for 05/08/21  OCT, Retina - OU - Both Eyes       Right Eye Quality was good. Scan locations included subfoveal. Central Foveal Thickness: 280. Progression has improved. Findings include abnormal foveal contour, central retinal atrophy, cystoid macular edema.   Left Eye Quality was good. Scan locations included subfoveal. Central Foveal Thickness: 287. Progression has been stable. Findings include normal foveal contour, vitreomacular adhesion .   Notes Much improved CME today OD secondary to Hemi central retinal vein occlusion right eye chronic and slightly slightly recurrent currently at 12-week follow-up.  Repeat injection today and reevaluate 12 weeks +2 weeks extra to 14 weeks        Intravitreal Injection, Pharmacologic Agent - OD - Right Eye       Time Out 05/08/2021. 1:55 PM. Confirmed correct patient, procedure, site, and patient consented.   Anesthesia Topical anesthesia was used. Anesthetic medications included Lidocaine 4%.   Procedure Preparation included Ofloxacin , 10% betadine to eyelids, 5% betadine to ocular surface. A 30 gauge needle was used.   Injection: 2.5 mg bevacizumab 2.5 MG/0.1ML   Route: Intravitreal, Site: Right Eye   NDC: [phone], Lot: [id number]   Post-op Post injection exam  found visual acuity of at least counting fingers. The patient tolerated the procedure well. There were no complications. The patient received written and verbal post procedure care education. Post injection medications were not given.              ASSESSMENT/PLAN:  Central retinal vein occlusion with macular edema of right eye Hemi CRV O, with chronic recurrent CME yet out to 12.6 weeks at this date.  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 Pseudophakia, both eyes  Z96.1     3. Macular puckering, right eye  H35.371     4. Vitreomacular adhesion of left eye  H43.822     5. Cystoid macular edema of right eye  H35.351       1.  OD with minor recurrent CME at 41-month intervals  specifically 12 weeks and 6 days.  Repeat injection today and will follow-up in 14 weeks.  I am hopeful that the extensive collateralization of vessels at the optic nerve OD are significant signs that the CME may be involutional at this point as enough collateralization exist  2.  3.  Ophthalmic Meds Ordered this visit:  Meds ordered this encounter  Medications   bevacizumab (AVASTIN) SOSY 2.5 mg       Return in about 14 weeks (around 08/14/2021) for DILATE OU, AVASTIN OCT, OD.  There are no Patient Instructions on file for this visit.   Explained the diagnoses, plan, and follow up with the patient and they expressed  understanding.  Patient expressed understanding of the importance of proper follow up care.   Clent Demark Rifka Ramey M.D. Diseases & Surgery of the Retina and Vitreous Retina & Diabetic [place] 05/08/21     Abbreviations: M myopia (nearsighted); A astigmatism; H hyperopia (farsighted); P presbyopia; Mrx spectacle prescription;  CTL contact lenses; OD right eye; OS left eye; OU both eyes  XT exotropia; ET esotropia; PEK punctate epithelial keratitis; PEE punctate epithelial erosions; DES dry eye syndrome; MGD meibomian gland dysfunction; ATs artificial tears; PFAT's preservative free artificial tears; [place] nuclear sclerotic cataract; PSC posterior subcapsular cataract; ERM epi-retinal membrane; PVD posterior vitreous detachment; RD retinal detachment; DM diabetes mellitus; DR diabetic retinopathy; NPDR non-proliferative diabetic retinopathy; PDR proliferative diabetic retinopathy; CSME clinically significant macular edema; DME diabetic macular edema; dbh dot blot hemorrhages; CWS cotton wool spot; POAG primary open angle glaucoma; C/D cup-to-disc ratio; HVF humphrey visual field; GVF goldmann visual field; OCT optical coherence tomography; IOP intraocular pressure; BRVO Branch retinal vein occlusion; CRVO central retinal vein occlusion; CRAO central retinal artery occlusion; BRAO branch retinal artery occlusion; RT retinal tear; SB scleral buckle; PPV pars plana vitrectomy; VH Vitreous hemorrhage; PRP panretinal laser photocoagulation; IVK intravitreal kenalog; VMT vitreomacular traction; MH Macular hole;  NVD neovascularization of the disc; NVE neovascularization elsewhere; AREDS age related eye disease study; ARMD age related macular degeneration; POAG primary open angle glaucoma; EBMD epithelial/anterior basement membrane dystrophy; ACIOL anterior chamber intraocular lens; IOL intraocular lens; PCIOL posterior chamber intraocular lens; Phaco/IOL phacoemulsification with intraocular lens placement; [place]  photorefractive keratectomy; LASIK laser assisted in situ keratomileusis; HTN hypertension; DM diabetes mellitus; COPD chronic obstructive pulmonary disease

## 2021-05-08 NOTE — Assessment & Plan Note (Signed)
Compounded to Hemi CRV O and CME OD

## 2021-05-15 NOTE — Progress Notes (Signed)
° °  I, Jorge [name], LAT, ATC, am serving as scribe for Dr. Lynne Leader.  Jorge [name] is a 76 y.o. male who presents to [place] at [facility] today for f/u of chronic R shoulder pain thought to be due to both [facility] DJD and subacromial/GHJ DJD.  He was last seen by Dr. Georgina Snell on 04/18/21 and had an ACJ steroid injection that only provided limited relief.  He was also referred to PT/OT at [facility] but never scheduled any visits. Today, pt reports R shoulder did improve from injection and was not painful until last week. Pt received communication from PT, but did not feel like he needed to schedule any visits. Pt suffered a fall last week when trying to bend over and pick a fork up off the ground. Pt fell with arms straight out in front.  His pain did worsen after the fall last week but is feeling a lot better now.  He denies any weakness.  Diagnostic testing: R shoulder XR- 04/18/21  Pertinent review of systems: No fevers or chills  Relevant historical information: Heart disease.  CKD.   Exam:  BP (!) 152/88    Pulse 61    Ht 5\' 10"  (1.778 m)    Wt 221 lb 9.6 oz (100.5 kg)    SpO2 96%    BMI 31.80 kg/m  General: Well Developed, well nourished, and in no acute distress.   MSK: Right shoulder normal-appearing Nontender. Normal motion. Some pain with abduction beyond 100 degrees. Intact strength. Mildly positive Hawkins and Neer's test.    Assessment and Plan: 76 y.o. male with right shoulder pain.  Did well after an injection at the last visit about a month ago and never did need to go to physical therapy.  However he had a slow motion low impact fall a few days ago and had worsening shoulder pain as result.  His physical exam today is reassuring and his pain is improving so I think it is unlikely that he is going to require any special care.  However if his pain is not rapidly improving I do think physical therapy would be the next step.  Provided him with a phone  number to the physical therapy referral to [facility] in [place] that he can call and schedule.  Happy to see him back as needed.  Recheck as needed.   Discussed warning signs or symptoms. Please see discharge instructions. Patient expresses understanding.   The above documentation has been reviewed and is accurate and complete Lynne Leader, M.D.

## 2021-05-16 ENCOUNTER — Ambulatory Visit (INDEPENDENT_AMBULATORY_CARE_PROVIDER_SITE_OTHER): Payer: Medicare Other | Admitting: Family Medicine

## 2021-05-16 ENCOUNTER — Other Ambulatory Visit: Payer: Self-pay

## 2021-05-16 VITALS — BP 152/88 | HR 61 | Ht 70.0 in | Wt 221.6 lb

## 2021-05-16 DIAGNOSIS — M25511 Pain in right shoulder: Secondary | ICD-10-CM | POA: Diagnosis not present

## 2021-05-16 DIAGNOSIS — G8929 Other chronic pain: Secondary | ICD-10-CM | POA: Diagnosis not present

## 2021-05-16 NOTE — Patient Instructions (Addendum)
Thank you for coming in today.   If not improving call and schedule PT   Where: [facility] Address: [id number] [place] [address] Phone: [phone] Expires: 04/18/2022 (requested)

## 2021-08-15 ENCOUNTER — Encounter (INDEPENDENT_AMBULATORY_CARE_PROVIDER_SITE_OTHER): Payer: Medicare Other | Admitting: Ophthalmology

## 2021-08-16 DIAGNOSIS — M0609 Rheumatoid arthritis without rheumatoid factor, multiple sites: Secondary | ICD-10-CM | POA: Diagnosis not present

## 2021-08-16 DIAGNOSIS — M5030 Other cervical disc degeneration, unspecified cervical region: Secondary | ICD-10-CM | POA: Diagnosis not present

## 2021-08-16 DIAGNOSIS — Z79899 Other long term (current) drug therapy: Secondary | ICD-10-CM | POA: Diagnosis not present

## 2021-08-16 DIAGNOSIS — M1990 Unspecified osteoarthritis, unspecified site: Secondary | ICD-10-CM | POA: Diagnosis not present

## 2021-08-21 ENCOUNTER — Ambulatory Visit (INDEPENDENT_AMBULATORY_CARE_PROVIDER_SITE_OTHER): Payer: Medicare Other | Admitting: Ophthalmology

## 2021-08-21 ENCOUNTER — Encounter (INDEPENDENT_AMBULATORY_CARE_PROVIDER_SITE_OTHER): Payer: Medicare Other | Admitting: Ophthalmology

## 2021-08-21 ENCOUNTER — Encounter (INDEPENDENT_AMBULATORY_CARE_PROVIDER_SITE_OTHER): Payer: Self-pay | Admitting: Ophthalmology

## 2021-08-21 DIAGNOSIS — H35371 Puckering of macula, right eye: Secondary | ICD-10-CM

## 2021-08-21 DIAGNOSIS — H34811 Central retinal vein occlusion, right eye, with macular edema: Secondary | ICD-10-CM

## 2021-08-21 MED ORDER — BEVACIZUMAB 2.5 MG/0.1ML IZ SOSY
2.5000 mg | PREFILLED_SYRINGE | INTRAVITREAL | Status: AC | PRN
  Administered 2021-08-21: 2.5 mg via INTRAVITREAL

## 2021-08-21 NOTE — Assessment & Plan Note (Signed)
Vastly improved overall with residual CME temporally, with no impact on acuity yet at 14 weeks doing very well.  Repeat Avastin today and maintain 47-monthor 14-week interval

## 2021-08-21 NOTE — Progress Notes (Signed)
08/21/2021     CHIEF COMPLAINT Patient presents for  Chief Complaint  Patient presents with   Central Retinal Vein Occlusion      HISTORY OF PRESENT ILLNESS: Jorge [name] is a 76 y.o. male who presents to the clinic today for:   HPI   14 weeks for DILATE OU, AVASTIN OCT, OD. Pt stated no changes in vision.  Pt denies new floaters and FOL.  Last edited by Silvestre Moment on 08/21/2021  1:24 PM.      Referring physician: Deland Pretty, MD [address],  [place] [address]  HISTORICAL INFORMATION:   Selected notes from the MEDICAL RECORD NUMBER    Lab Results  Component Value Date   HGBA1C 5.7 (H) 10/29/2013     CURRENT MEDICATIONS: No current outpatient medications on file. (Ophthalmic Drugs)   No current facility-administered medications for this visit. (Ophthalmic Drugs)   Current Outpatient Medications (Other)  Medication Sig   amoxicillin (AMOXIL) 500 MG capsule Take 1 capsule by mouth every 6 (six) hours. (Patient not taking: Reported on 05/16/2021)   aspirin EC 81 MG tablet Take 81 mg by mouth daily.   atorvastatin (LIPITOR) 80 MG tablet Take 40 mg by mouth daily.    ezetimibe (ZETIA) 10 MG tablet Take 10 mg by mouth daily with breakfast.   fish oil-omega-3 fatty acids 1000 MG capsule Take 2 g by mouth daily with breakfast.    metoprolol succinate (TOPROL-XL) 25 MG 24 hr tablet TAKE 1/2 TABLET BY MOUTH EVERY DAY WITH BREAKFAST   Multiple Vitamin (MULTIVITAMIN) capsule Take 1 capsule by mouth daily.     MYRBETRIQ 50 MG TB24 tablet Take 50 mg by mouth daily.   pantoprazole (PROTONIX) 40 MG tablet Take 1 tablet (40 mg total) by mouth daily.   Tofacitinib Citrate 5 MG TABS Take 5 mg by mouth daily.   No current facility-administered medications for this visit. (Other)      REVIEW OF SYSTEMS: ROS   Negative for: Constitutional, Gastrointestinal, Neurological, Skin, Genitourinary, Musculoskeletal, HENT, Endocrine, Cardiovascular, Eyes,  Respiratory, Psychiatric, Allergic/Imm, Heme/Lymph Last edited by Silvestre Moment on 08/21/2021  1:24 PM.       ALLERGIES Allergies  Allergen Reactions   Niacin Itching and Swelling    PAST MEDICAL HISTORY Past Medical History:  Diagnosis Date   Arthritis    CAD (coronary artery disease)    PTCA diagonal 2001, residual 60% LAD /  Nuclear 2006, no ischemia   Carotid artery disease ([place])    Doppler, October, 2012, 0-39% bilateral stenoses.   Diverticulitis    Dyslipidemia    Ejection fraction    EF normal,   Eye abnormalities 08-23-2011   right ryr injection for bleed done by dr Zadie Rhine, dr Zadie Rhine told pt ok to have surgery 09-03-2011   Family history of anesthesia complication    SISTER HAD MEMORY PROBLEMS POST OP   Nuclear sclerotic cataract of left eye 09/23/2019   The nature of cataract was discussed with the patient as well as the elective nature of surgery. The patient was reassured that surgery at a later date does not put the patient at risk for a worse outcome. It was emphasized that the need for surgery is dictated by the patient's quality of life as influenced by the cataract. Patient was instructed to maintain close follow up with their general eye    Preop cardiovascular exam    Patient needs cardiac clearance for hernia surgery May, 2013  Psoriatic arthritis ([place])    2012   PVC's (premature ventricular contractions)    Frequent PVCs noted over the years., These always decrease with exercise. This is documented   Umbilical hernia 8/46/9629   Past Surgical History:  Procedure Laterality Date   ABLATION  06/09/2019   ANGIOPLASTY  12/1999 and 03/2000   CARDIAC CATHETERIZATION  2001   carpel tunnel  2009/2010   both wrists   COLONOSCOPY N/A 10/30/2013   Procedure: COLONOSCOPY;  Surgeon: Beryle Beams, MD;  Location: [facility];  Service: Endoscopy;  Laterality: N/A;   ESOPHAGOGASTRODUODENOSCOPY N/A 10/30/2013   Procedure: ESOPHAGOGASTRODUODENOSCOPY (EGD);  Surgeon: Beryle Beams, MD;  Location: [facility] ENDOSCOPY;  Service: Endoscopy;  Laterality: N/A;   INGUINAL HERNIA REPAIR  09/03/2011   Procedure: HERNIA REPAIR INGUINAL ADULT;  Surgeon: Imogene Burn. Georgette Dover, MD;  Location: [facility];  Service: General;  Laterality: Left;  Left Ingunal Hernia Repair with Mesh   PVC ABLATION N/A 06/09/2019   Procedure: PVC ABLATION;  Surgeon: Evans Lance, MD;  Location: [facility];  Service: Cardiovascular;  Laterality: N/A;   right knee meniscus repair  5284'X   UMBILICAL HERNIA REPAIR  09/03/2011   Procedure: HERNIA REPAIR UMBILICAL ADULT;  Surgeon: Imogene Burn. Georgette Dover, MD;  Location: [facility];  Service: General;  Laterality: N/A;    FAMILY HISTORY Family History  Problem Relation Age of Onset   Cancer Mother        uterine   Cancer Sister        Breast   Crohn's disease Cousin        distant   Colon cancer Neg Hx    Ulcerative colitis Neg Hx     SOCIAL HISTORY Social History   Tobacco Use   Smoking status: Former    Packs/day: 1.50    Years: 25.00    Pack years: 37.50    Types: Cigarettes    Quit date: 12/08/1994    Years since quitting: 26.7   Smokeless tobacco: Former  Scientific laboratory technician Use: Never used  Substance Use Topics   Alcohol use: Yes    Comment: 2 drink per day   Drug use: No         OPHTHALMIC EXAM:  Base Eye Exam     Visual Acuity (ETDRS)       Right Left   Dist cc 20/40 20/20   Dist ph cc 20/30 -1     Correction: Glasses         Tonometry (Tonopen, 1:29 PM)       Right Left   Pressure 14 17         Pupils       Pupils APD   Right PERRL None   Left PERRL None         Visual Fields       Left Right    Full Full         Extraocular Movement       Right Left    Full Full         Neuro/Psych     Oriented x3: Yes   Mood/Affect: Normal         Dilation     Both eyes: 1.0% Mydriacyl, 2.5% Phenylephrine @ 1:29 PM           Slit Lamp and Fundus Exam     External Exam       Right Left    External Normal  Normal         Slit Lamp Exam       Right Left   Lids/Lashes Normal Normal   Conjunctiva/Sclera White and quiet White and quiet   Cornea Clear Clear   Anterior Chamber Deep and quiet Deep and quiet   Iris Round and reactive Round and reactive   Lens Centered posterior chamber intraocular lens Centered posterior chamber intraocular lens   Anterior Vitreous Normal Normal         Fundus Exam       Right Left   Posterior Vitreous Posterior vitreous detachment, Central vitreous floaters Normal   Disc Collaterals, very extensive on the nerve Normal   C/D Ratio 0.25 0.4   Macula Microaneurysms, no cystoid macular edema Normal   Vessels Vein occlusion partially compensated with collaterals on the nerve Normal   Periphery Normal Normal            IMAGING AND PROCEDURES  Imaging and Procedures for 08/21/21  OCT, Retina - OU - Both Eyes       Right Eye Quality was good. Scan locations included subfoveal. Central Foveal Thickness: 285. Progression has improved. Findings include abnormal foveal contour, central retinal atrophy, cystoid macular edema.   Left Eye Quality was good. Scan locations included subfoveal. Central Foveal Thickness: 285. Progression has been stable. Findings include normal foveal contour, vitreomacular adhesion .   Notes Much improved CME today OD secondary to Hemi central retinal vein occlusion right eye chronic and slightly slightly recurrent currently at 13-week follow-up.  Repeat injection today and reevaluate 13 weeks, stable overall        Intravitreal Injection, Pharmacologic Agent - OD - Right Eye       Time Out 08/21/2021. 1:58 PM. Confirmed correct patient, procedure, site, and patient consented.   Anesthesia Topical anesthesia was used. Anesthetic medications included Lidocaine 4%.   Procedure Preparation included Ofloxacin , 10% betadine to eyelids, 5% betadine to ocular surface. A 30 gauge needle was used.    Injection: 2.5 mg bevacizumab 2.5 MG/0.1ML   Route: Intravitreal, Site: Right Eye   NDC: [id number], Lot: [id number], Expiration date: 10/15/2021   Post-op Post injection exam found visual acuity of at least counting fingers. The patient tolerated the procedure well. There were no complications. The patient received written and verbal post procedure care education. Post injection medications were not given.              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  Repeat Avastin today and maintain 73-monthor 14-week interval  Macular puckering, right eye Minor OD not impactful     ICD-10-CM   1. 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 Macular puckering, right eye  H35.371       1.  OD stable acuity and stable macular findings from CRV O with secondary CME.  Center involvement is spared today at 14-week interval post most recent Avastin.  History of recurrence in the past despite collateralization seen on the optic nerve.  We will repeat injection today and reevaluate next in 14 weeks or 3 months  2.  3.  Ophthalmic Meds Ordered this visit:  Meds ordered this encounter  Medications   bevacizumab (AVASTIN) SOSY 2.5 mg       Return in about 14 weeks (around 11/27/2021) for dilate, OD, AVASTIN OCT.  There are no Patient Instructions on file for this visit.   Explained the diagnoses, plan, and follow up with the patient and they expressed understanding.  Patient expressed understanding of the importance of proper follow up care.   Clent Demark Yuvraj Pfeifer M.D. Diseases & Surgery of the Retina and Vitreous Retina & Diabetic [place] 08/21/21     Abbreviations: M myopia (nearsighted); A astigmatism; H hyperopia (farsighted); P presbyopia; Mrx spectacle prescription;  CTL contact lenses; OD right eye; OS left eye; OU both eyes  XT exotropia; ET esotropia; PEK punctate epithelial keratitis; PEE punctate epithelial erosions; DES dry eye syndrome; MGD meibomian gland dysfunction; ATs artificial tears; PFAT's preservative free artificial tears; [place] nuclear sclerotic cataract; PSC posterior subcapsular cataract; ERM epi-retinal membrane; PVD posterior vitreous detachment; RD retinal detachment; DM diabetes mellitus; DR diabetic retinopathy; NPDR non-proliferative diabetic retinopathy; PDR proliferative diabetic retinopathy; CSME clinically significant macular edema; DME diabetic macular edema; dbh dot blot hemorrhages; CWS cotton wool spot; POAG primary open angle glaucoma; C/D cup-to-disc ratio; HVF humphrey visual field; GVF goldmann visual field; OCT optical coherence tomography; IOP intraocular pressure; BRVO Branch retinal vein occlusion; CRVO central retinal vein occlusion; CRAO central retinal artery occlusion; BRAO branch retinal artery occlusion; RT retinal tear; SB scleral buckle; PPV pars plana vitrectomy; VH Vitreous hemorrhage; PRP panretinal laser photocoagulation; IVK intravitreal kenalog; VMT vitreomacular traction; MH Macular hole;  NVD neovascularization of the disc; NVE neovascularization elsewhere; AREDS age related eye disease study; ARMD age related macular degeneration; POAG primary open angle glaucoma; EBMD epithelial/anterior basement membrane dystrophy; ACIOL anterior chamber intraocular lens; IOL intraocular lens; PCIOL posterior chamber intraocular lens; Phaco/IOL phacoemulsification with intraocular lens placement; [place] photorefractive keratectomy; LASIK laser assisted in situ keratomileusis; HTN hypertension; DM diabetes mellitus; COPD chronic obstructive pulmonary disease

## 2021-08-21 NOTE — Assessment & Plan Note (Signed)
Minor OD not impactful

## 2021-08-22 ENCOUNTER — Encounter (INDEPENDENT_AMBULATORY_CARE_PROVIDER_SITE_OTHER): Payer: Medicare Other | Admitting: Ophthalmology

## 2021-09-15 DIAGNOSIS — I10 Essential (primary) hypertension: Secondary | ICD-10-CM | POA: Diagnosis not present

## 2021-09-15 DIAGNOSIS — E785 Hyperlipidemia, unspecified: Secondary | ICD-10-CM | POA: Diagnosis not present

## 2021-11-28 ENCOUNTER — Ambulatory Visit (INDEPENDENT_AMBULATORY_CARE_PROVIDER_SITE_OTHER): Payer: Medicare Other | Admitting: Ophthalmology

## 2021-11-28 ENCOUNTER — Encounter (INDEPENDENT_AMBULATORY_CARE_PROVIDER_SITE_OTHER): Payer: Self-pay | Admitting: Ophthalmology

## 2021-11-28 DIAGNOSIS — H34811 Central retinal vein occlusion, right eye, with macular edema: Secondary | ICD-10-CM | POA: Diagnosis not present

## 2021-11-28 DIAGNOSIS — H35371 Puckering of macula, right eye: Secondary | ICD-10-CM | POA: Diagnosis not present

## 2021-11-28 MED ORDER — BEVACIZUMAB CHEMO INJECTION 1.25MG/0.05ML SYRINGE FOR KALEIDOSCOPE
1.2500 mg | INTRAVITREAL | Status: AC | PRN
  Administered 2021-11-28: 1.25 mg via INTRAVITREAL

## 2021-11-28 NOTE — Assessment & Plan Note (Signed)
Minor OD, Not impactful on acuity

## 2021-11-28 NOTE — Progress Notes (Signed)
11/28/2021     CHIEF COMPLAINT Patient presents for  Chief Complaint  Patient presents with   Central Retinal Vein Occlusion      HISTORY OF PRESENT ILLNESS: Jorge [name] is a 76 y.o. male who presents to the clinic today for:   HPI   14 weeks for DILATE OD, AVASTIN OCT. Pt stated vision has remained stable since last visit.  Last edited by Silvestre Moment on 11/28/2021 10:43 AM.      Referring physician: Deland Pretty, MD [place],  [place] [address]  HISTORICAL INFORMATION:   Selected notes from the MEDICAL RECORD NUMBER    Lab Results  Component Value Date   HGBA1C 5.7 (H) 10/29/2013     CURRENT MEDICATIONS: No current outpatient medications on file. (Ophthalmic Drugs)   No current facility-administered medications for this visit. (Ophthalmic Drugs)   Current Outpatient Medications (Other)  Medication Sig   amoxicillin (AMOXIL) 500 MG capsule Take 1 capsule by mouth every 6 (six) hours. (Patient not taking: Reported on 05/16/2021)   aspirin EC 81 MG tablet Take 81 mg by mouth daily.   atorvastatin (LIPITOR) 80 MG tablet Take 40 mg by mouth daily.    ezetimibe (ZETIA) 10 MG tablet Take 10 mg by mouth daily with breakfast.   fish oil-omega-3 fatty acids 1000 MG capsule Take 2 g by mouth daily with breakfast.    metoprolol succinate (TOPROL-XL) 25 MG 24 hr tablet TAKE 1/2 TABLET BY MOUTH EVERY DAY WITH BREAKFAST   Multiple Vitamin (MULTIVITAMIN) capsule Take 1 capsule by mouth daily.     MYRBETRIQ 50 MG TB24 tablet Take 50 mg by mouth daily.   pantoprazole (PROTONIX) 40 MG tablet Take 1 tablet (40 mg total) by mouth daily.   Tofacitinib Citrate 5 MG TABS Take 5 mg by mouth daily.   No current facility-administered medications for this visit. (Other)      REVIEW OF SYSTEMS: ROS   Negative for: Constitutional, Gastrointestinal, Neurological, Skin, Genitourinary, Musculoskeletal, HENT, Endocrine, Cardiovascular, Eyes, Respiratory,  Psychiatric, Allergic/Imm, Heme/Lymph Last edited by Silvestre Moment on 11/28/2021 10:43 AM.       ALLERGIES Allergies  Allergen Reactions   Niacin Itching and Swelling    PAST MEDICAL HISTORY Past Medical History:  Diagnosis Date   Arthritis    CAD (coronary artery disease)    PTCA diagonal 2001, residual 60% LAD /  Nuclear 2006, no ischemia   Carotid artery disease ([place])    Doppler, October, 2012, 0-39% bilateral stenoses.   Diverticulitis    Dyslipidemia    Ejection fraction    EF normal,   Eye abnormalities 08-23-2011   right ryr injection for bleed done by dr Zadie Rhine, dr Zadie Rhine told pt ok to have surgery 09-03-2011   Family history of anesthesia complication    SISTER HAD MEMORY PROBLEMS POST OP   Nuclear sclerotic cataract of left eye 09/23/2019   The nature of cataract was discussed with the patient as well as the elective nature of surgery. The patient was reassured that surgery at a later date does not put the patient at risk for a worse outcome. It was emphasized that the need for surgery is dictated by the patient's quality of life as influenced by the cataract. Patient was instructed to maintain close follow up with their general eye    Preop cardiovascular exam    Patient needs cardiac clearance for hernia surgery May, 2013   Psoriatic arthritis [facility])    2012  PVC's (premature ventricular contractions)    Frequent PVCs noted over the years., These always decrease with exercise. This is documented   Umbilical hernia 7/74/1287   Past Surgical History:  Procedure Laterality Date   ABLATION  06/09/2019   ANGIOPLASTY  12/1999 and 03/2000   CARDIAC CATHETERIZATION  2001   carpel tunnel  2009/2010   both wrists   COLONOSCOPY N/A 10/30/2013   Procedure: COLONOSCOPY;  Surgeon: Beryle Beams, MD;  Location: [facility];  Service: Endoscopy;  Laterality: N/A;   ESOPHAGOGASTRODUODENOSCOPY N/A 10/30/2013   Procedure: ESOPHAGOGASTRODUODENOSCOPY (EGD);  Surgeon: Beryle Beams, MD;   Location: [facility] ENDOSCOPY;  Service: Endoscopy;  Laterality: N/A;   INGUINAL HERNIA REPAIR  09/03/2011   Procedure: HERNIA REPAIR INGUINAL ADULT;  Surgeon: Imogene Burn. Georgette Dover, MD;  Location: [facility];  Service: General;  Laterality: Left;  Left Ingunal Hernia Repair with Mesh   PVC ABLATION N/A 06/09/2019   Procedure: PVC ABLATION;  Surgeon: Evans Lance, MD;  Location: [facility];  Service: Cardiovascular;  Laterality: N/A;   right knee meniscus repair  8676'H   UMBILICAL HERNIA REPAIR  09/03/2011   Procedure: HERNIA REPAIR UMBILICAL ADULT;  Surgeon: Imogene Burn. Georgette Dover, MD;  Location: [facility];  Service: General;  Laterality: N/A;    FAMILY HISTORY Family History  Problem Relation Age of Onset   Cancer Mother        uterine   Cancer Sister        Breast   Crohn's disease Cousin        distant   Colon cancer Neg Hx    Ulcerative colitis Neg Hx     SOCIAL HISTORY Social History   Tobacco Use   Smoking status: Former    Packs/day: 1.50    Years: 25.00    Total pack years: 37.50    Types: Cigarettes    Quit date: 12/08/1994    Years since quitting: 26.9   Smokeless tobacco: Former  Scientific laboratory technician Use: Never used  Substance Use Topics   Alcohol use: Yes    Comment: 2 drink per day   Drug use: No         OPHTHALMIC EXAM:  Base Eye Exam     Visual Acuity (ETDRS)       Right Left   Dist cc 20/40 -1 20/20   Dist ph cc 20/30 -1     Correction: Glasses         Tonometry (Tonopen, 10:47 AM)       Right Left   Pressure 15 16         Pupils       Pupils APD   Right PERRL None   Left PERRL None         Visual Fields       Left Right    Full Full         Extraocular Movement       Right Left    Full, Ortho Full, Ortho         Neuro/Psych     Oriented x3: Yes   Mood/Affect: Normal         Dilation     Right eye: 2.5% Phenylephrine, 1.0% Mydriacyl @ 10:47 AM           Slit Lamp and Fundus Exam     External Exam        Right Left   External Normal Normal  Slit Lamp Exam       Right Left   Lids/Lashes Normal Normal   Conjunctiva/Sclera White and quiet White and quiet   Cornea Clear Clear   Anterior Chamber Deep and quiet Deep and quiet   Iris Round and reactive Round and reactive   Lens Centered posterior chamber intraocular lens Centered posterior chamber intraocular lens   Anterior Vitreous Normal Normal         Fundus Exam       Right Left   Posterior Vitreous Posterior vitreous detachment, Central vitreous floaters    Disc Collaterals, very extensive on the nerve    C/D Ratio 0.25    Macula Microaneurysms, no cystoid macular edema    Vessels Vein occlusion partially compensated with collaterals on the nerve    Periphery Normal             IMAGING AND PROCEDURES  Imaging and Procedures for 11/28/21  OCT, Retina - OU - Both Eyes       Right Eye Quality was good. Scan locations included subfoveal. Central Foveal Thickness: 281. Progression has improved. Findings include abnormal foveal contour, cystoid macular edema, central retinal atrophy.   Left Eye Quality was good. Scan locations included subfoveal. Central Foveal Thickness: 279. Progression has been stable. Findings include normal foveal contour, vitreomacular adhesion .   Notes Much improved CME today OD secondary to Hemi central retinal vein occlusion right eye chronic and slightly slightly recurrent currently at 13-week follow-up.  Repeat injection today and reevaluate 14 weeks, stable overall         Intravitreal Injection, Pharmacologic Agent - OD - Right Eye       Time Out 11/28/2021. 11:33 AM. Confirmed correct patient, procedure, site, and patient consented.   Anesthesia Topical anesthesia was used. Anesthetic medications included Lidocaine 4%.   Procedure Preparation included 5% betadine to ocular surface, 10% betadine to eyelids, Ofloxacin . A 30 gauge needle was used.   Injection: 1.25 mg  Bevacizumab 1.'25mg'$ /0.33m   Route: Intravitreal, Site: Right Eye   NDC: [id number] Lot: [id number] Expiration date: 01/31/2022   Post-op Post injection exam found visual acuity of at least counting fingers. The patient tolerated the procedure well. There were no complications. The patient received written and verbal post procedure care education. Post injection medications were not given.              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  However at 14-week interval between examination and delivery Avastin, CME is controlled and stable acuity.  We will maintain 14 to 15-week interval examination right eye  Macular puckering, right eye Minor OD, Not impactful on acuity     ICD-10-CM   1. 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 Macular puckering, right eye  H35.371       1.  OD center involved CME controlled today maintained at 14-week interval post Avastin.  We will repeat injection today reevaluate again in 14 weeks  2.  Instructed to call the office 2 weeks prior to evaluation [facility]  Ophthalmic Meds Ordered this visit:  Meds ordered this encounter  Medications   Bevacizumab (AVASTIN) SOLN  1.25 mg       Return in about 14 weeks (around 03/06/2022) for dilate, OD, AVASTIN OCT.  There are no Patient Instructions on file for this visit.   Explained the diagnoses, plan, and follow up with the patient and they expressed understanding.  Patient expressed understanding of the importance of proper follow up care.   Clent Demark Niti Leisure M.D. Diseases & Surgery of the Retina and Vitreous Retina & Diabetic [place] 11/28/21     Abbreviations: M myopia  (nearsighted); A astigmatism; H hyperopia (farsighted); P presbyopia; Mrx spectacle prescription;  CTL contact lenses; OD right eye; OS left eye; OU both eyes  XT exotropia; ET esotropia; PEK punctate epithelial keratitis; PEE punctate epithelial erosions; DES dry eye syndrome; MGD meibomian gland dysfunction; ATs artificial tears; PFAT's preservative free artificial tears; [place] nuclear sclerotic cataract; PSC posterior subcapsular cataract; ERM epi-retinal membrane; PVD posterior vitreous detachment; RD retinal detachment; DM diabetes mellitus; DR diabetic retinopathy; NPDR non-proliferative diabetic retinopathy; PDR proliferative diabetic retinopathy; CSME clinically significant macular edema; DME diabetic macular edema; dbh dot blot hemorrhages; CWS cotton wool spot; POAG primary open angle glaucoma; C/D cup-to-disc ratio; HVF humphrey visual field; GVF goldmann visual field; OCT optical coherence tomography; IOP intraocular pressure; BRVO Branch retinal vein occlusion; CRVO central retinal vein occlusion; CRAO central retinal artery occlusion; BRAO branch retinal artery occlusion; RT retinal tear; SB scleral buckle; PPV pars plana vitrectomy; VH Vitreous hemorrhage; PRP panretinal laser photocoagulation; IVK intravitreal kenalog; VMT vitreomacular traction; MH Macular hole;  NVD neovascularization of the disc; NVE neovascularization elsewhere; AREDS age related eye disease study; ARMD age related macular degeneration; POAG primary open angle glaucoma; EBMD epithelial/anterior basement membrane dystrophy; ACIOL anterior chamber intraocular lens; IOL intraocular lens; PCIOL posterior chamber intraocular lens; Phaco/IOL phacoemulsification with intraocular lens placement; [place] photorefractive keratectomy; LASIK laser assisted in situ keratomileusis; HTN hypertension; DM diabetes mellitus; COPD chronic obstructive pulmonary disease

## 2021-11-28 NOTE — Assessment & Plan Note (Signed)
OD with multiple recurrences of CME secondary to CRV O.  However at 14-week interval between examination and delivery Avastin, CME is controlled and stable acuity.  We will maintain 14 to 15-week interval examination right eye

## 2021-12-18 DIAGNOSIS — H35033 Hypertensive retinopathy, bilateral: Secondary | ICD-10-CM | POA: Diagnosis not present

## 2021-12-18 DIAGNOSIS — H34811 Central retinal vein occlusion, right eye, with macular edema: Secondary | ICD-10-CM | POA: Diagnosis not present

## 2021-12-18 DIAGNOSIS — H3562 Retinal hemorrhage, left eye: Secondary | ICD-10-CM | POA: Diagnosis not present

## 2021-12-18 DIAGNOSIS — H40013 Open angle with borderline findings, low risk, bilateral: Secondary | ICD-10-CM | POA: Diagnosis not present

## 2022-01-09 DIAGNOSIS — R350 Frequency of micturition: Secondary | ICD-10-CM | POA: Diagnosis not present

## 2022-01-09 DIAGNOSIS — N411 Chronic prostatitis: Secondary | ICD-10-CM | POA: Diagnosis not present

## 2022-03-06 ENCOUNTER — Encounter (INDEPENDENT_AMBULATORY_CARE_PROVIDER_SITE_OTHER): Payer: Medicare Other | Admitting: Ophthalmology

## 2022-03-23 IMAGING — DX DG SHOULDER 2+V*R*
3 series · 3 of 3 positions shown · non-contrast
Comparison: None.

CLINICAL DATA: Fall 1 year ago with persistent right shoulder pain,
initial encounter

EXAM:
RIGHT SHOULDER - 2+ VIEW

[shoulder ap (1 of 2)]
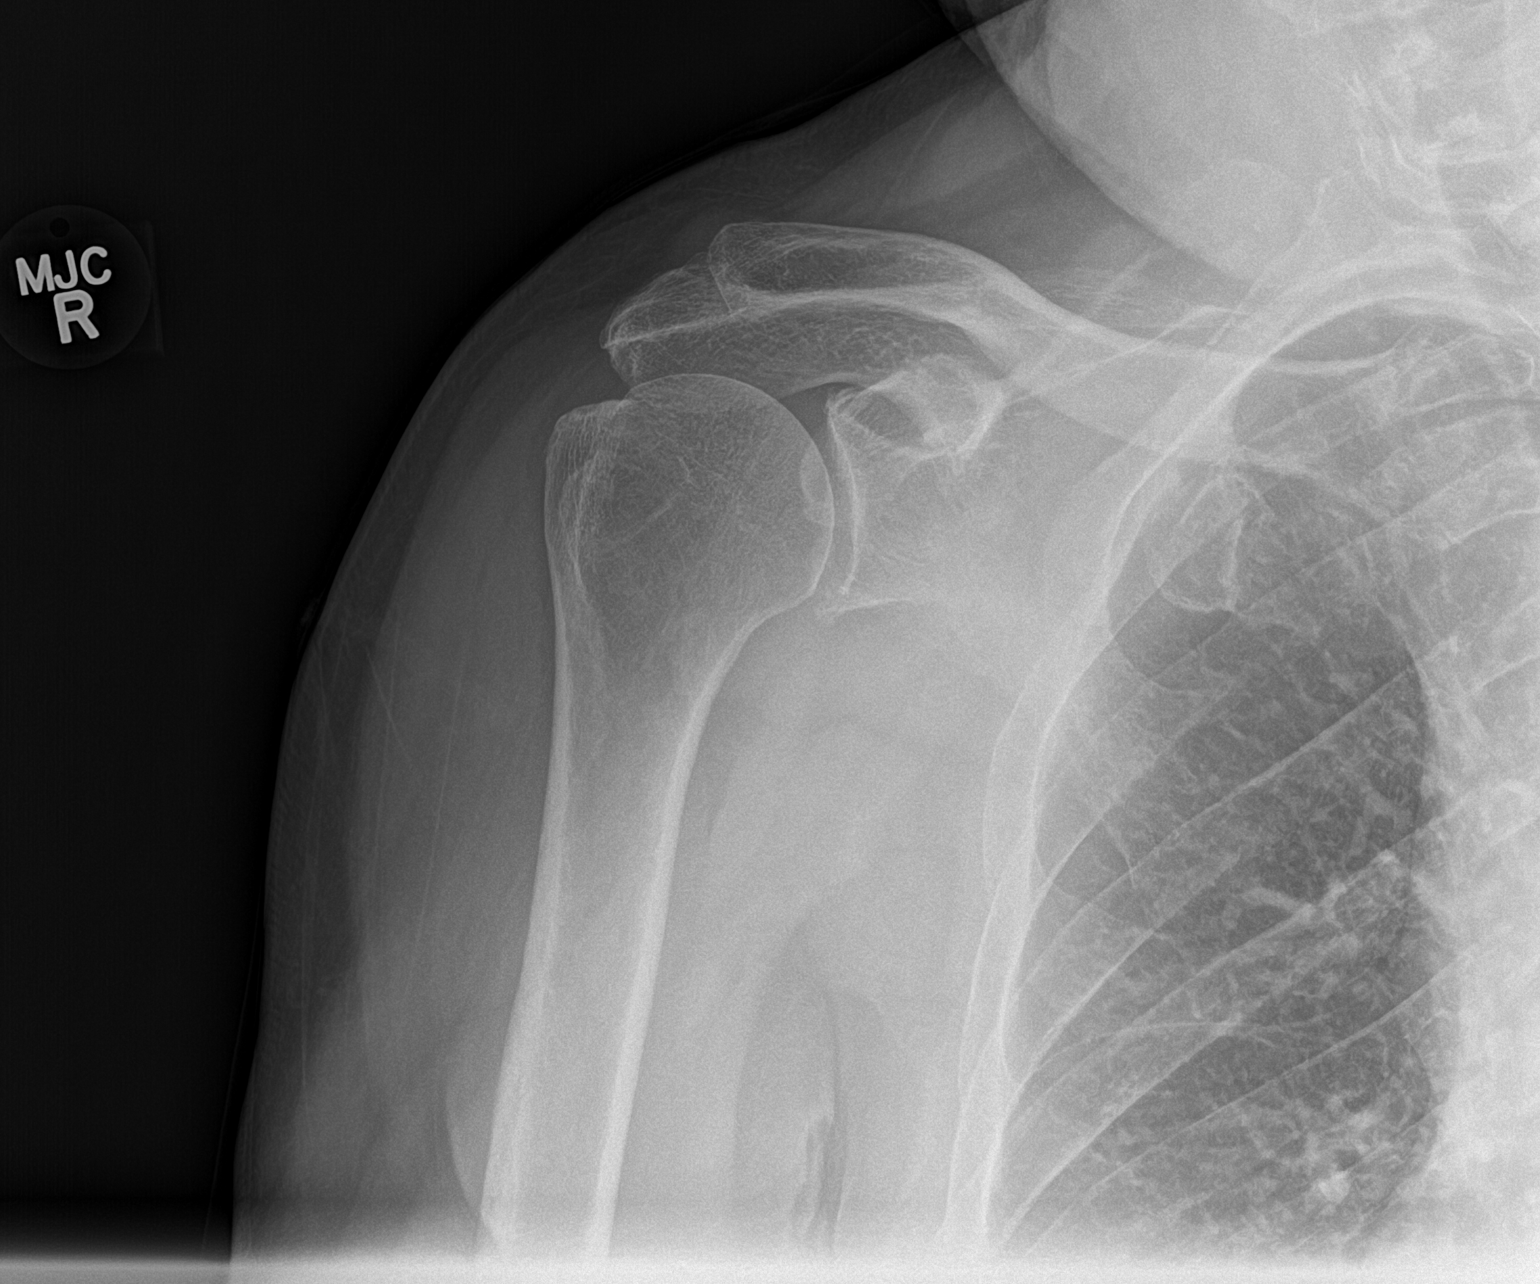

[shoulder ap (2 of 2)]
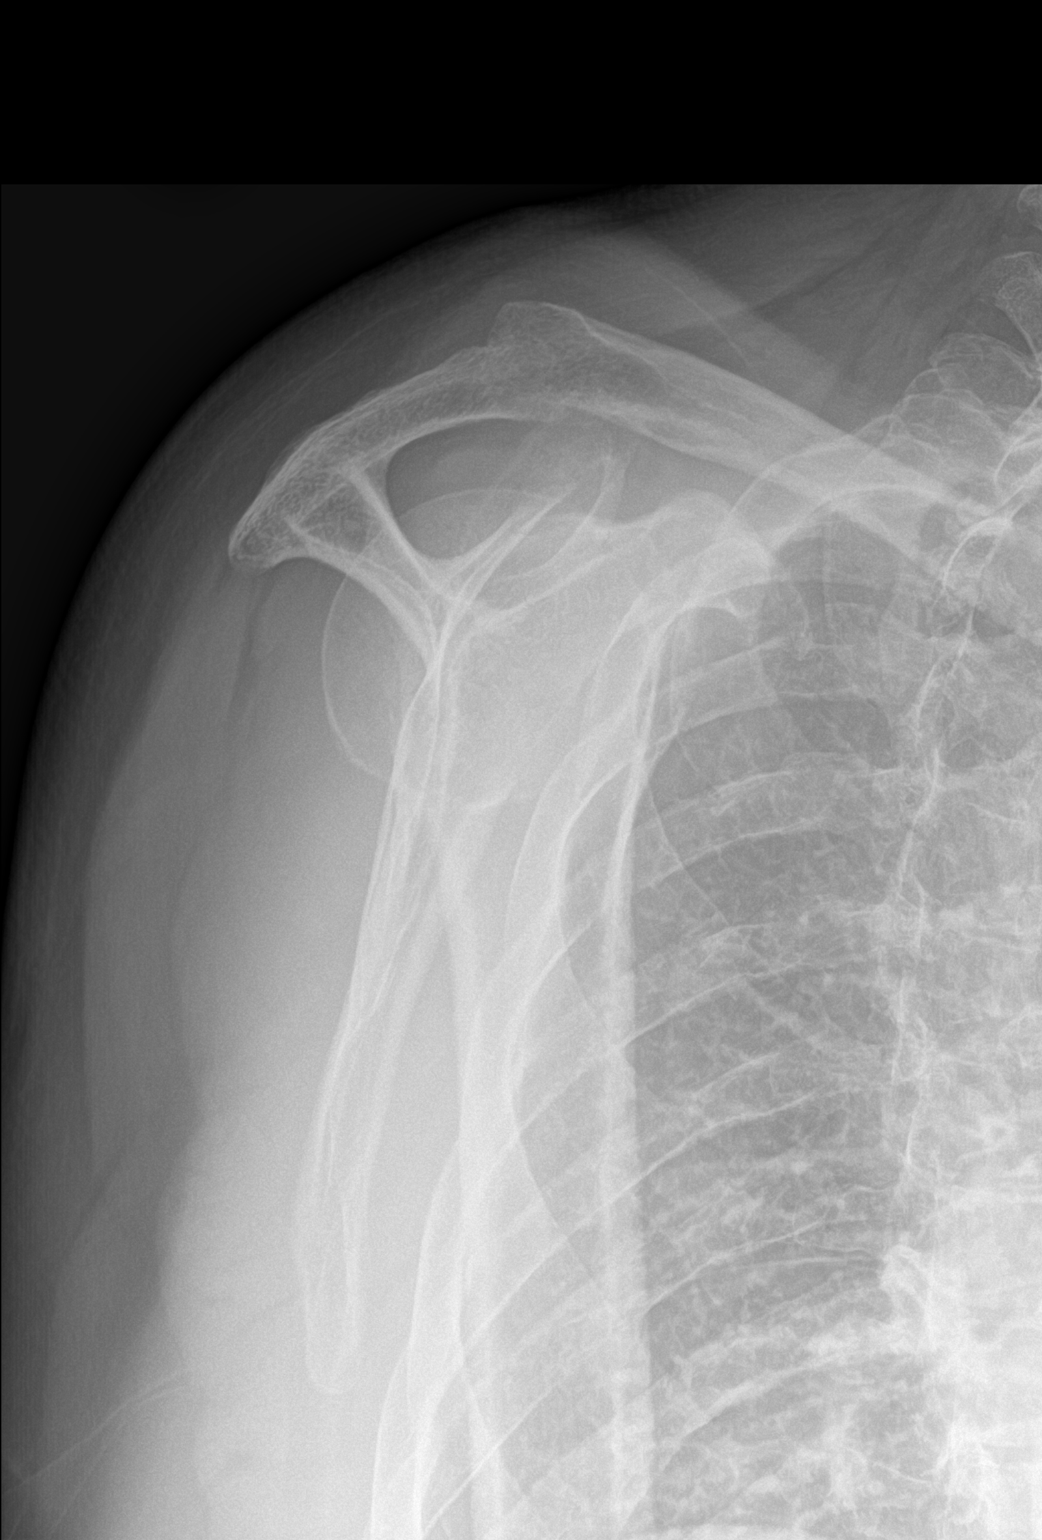

[shoulder axial]
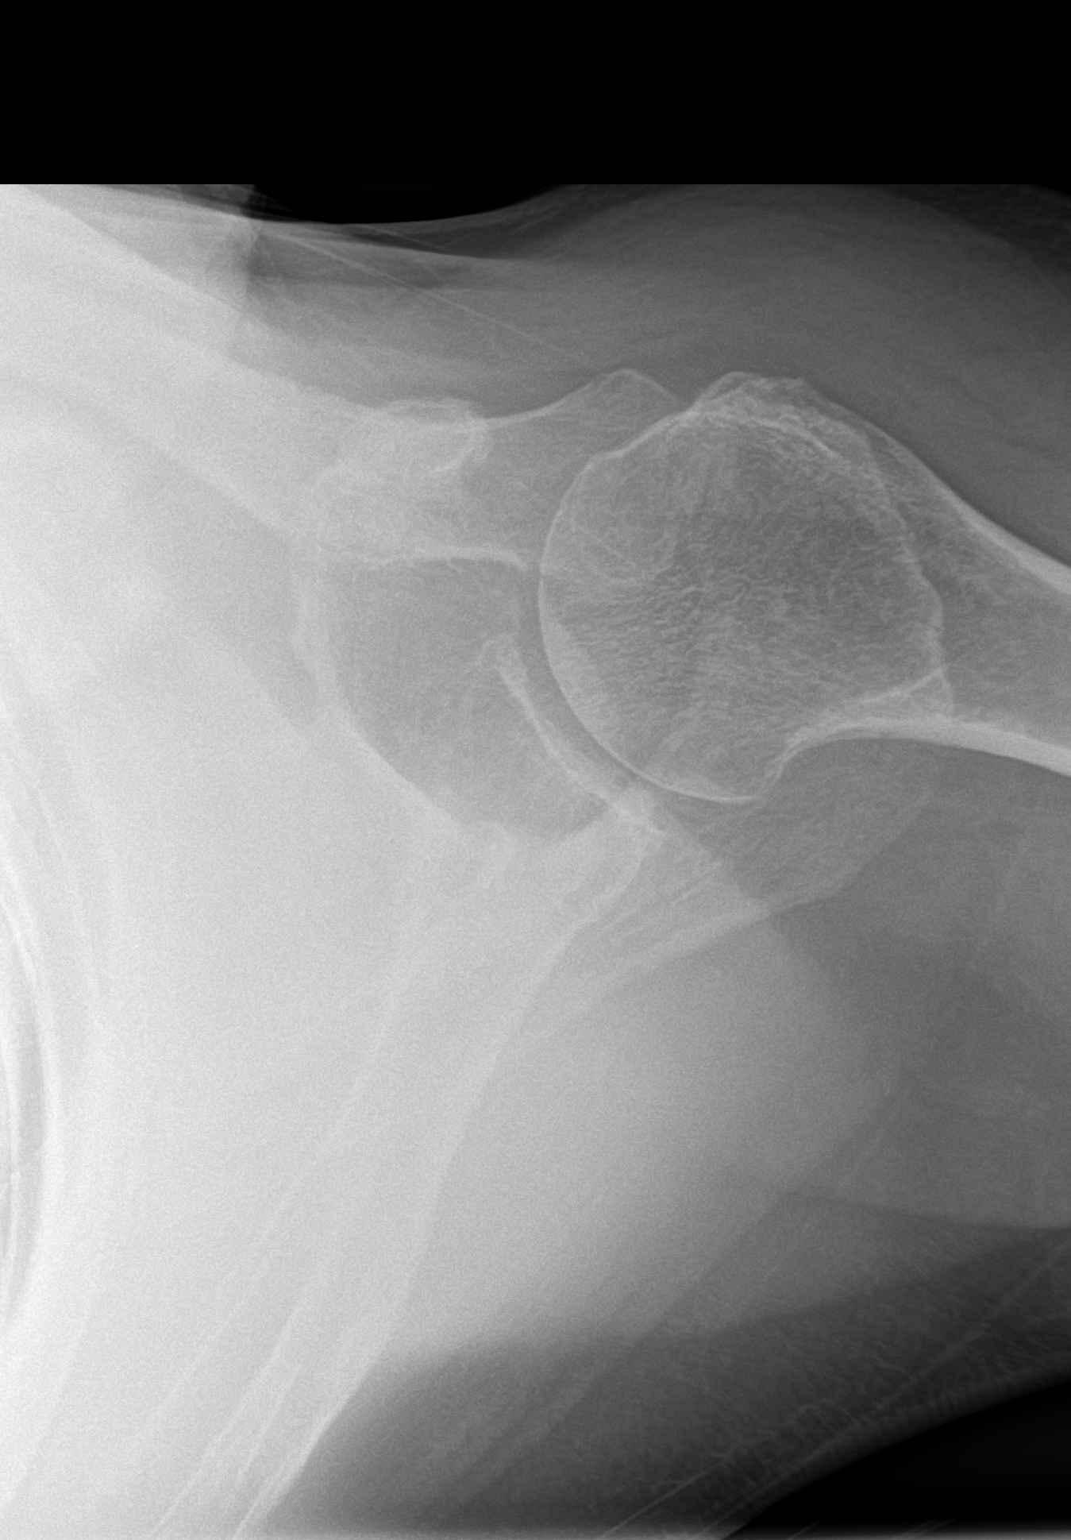

[3 of 3 positions shown; findings below may reference images not displayed]

FINDINGS: Degenerative changes of the acromioclavicular joint and glenohumeral
joint are seen. No acute fracture or dislocation is noted. No soft
tissue abnormality is seen. Bony thorax appears within normal
limits.
IMPRESSION: Degenerative change without acute abnormality.

## 2022-03-27 DIAGNOSIS — Z125 Encounter for screening for malignant neoplasm of prostate: Secondary | ICD-10-CM | POA: Diagnosis not present

## 2022-03-27 DIAGNOSIS — I10 Essential (primary) hypertension: Secondary | ICD-10-CM | POA: Diagnosis not present

## 2022-03-30 DIAGNOSIS — I2510 Atherosclerotic heart disease of native coronary artery without angina pectoris: Secondary | ICD-10-CM | POA: Diagnosis not present

## 2022-03-30 DIAGNOSIS — I779 Disorder of arteries and arterioles, unspecified: Secondary | ICD-10-CM | POA: Diagnosis not present

## 2022-03-30 DIAGNOSIS — J4541 Moderate persistent asthma with (acute) exacerbation: Secondary | ICD-10-CM | POA: Diagnosis not present

## 2022-03-30 DIAGNOSIS — I10 Essential (primary) hypertension: Secondary | ICD-10-CM | POA: Diagnosis not present

## 2022-03-30 DIAGNOSIS — Z0000 Encounter for general adult medical examination without abnormal findings: Secondary | ICD-10-CM | POA: Diagnosis not present

## 2022-03-30 DIAGNOSIS — Z23 Encounter for immunization: Secondary | ICD-10-CM | POA: Diagnosis not present

## 2022-04-05 ENCOUNTER — Ambulatory Visit (INDEPENDENT_AMBULATORY_CARE_PROVIDER_SITE_OTHER): Payer: Medicare Other | Admitting: Ophthalmology

## 2022-04-05 ENCOUNTER — Encounter (INDEPENDENT_AMBULATORY_CARE_PROVIDER_SITE_OTHER): Payer: Self-pay | Admitting: Ophthalmology

## 2022-04-05 DIAGNOSIS — Z961 Presence of intraocular lens: Secondary | ICD-10-CM | POA: Diagnosis not present

## 2022-04-05 DIAGNOSIS — H34811 Central retinal vein occlusion, right eye, with macular edema: Secondary | ICD-10-CM | POA: Diagnosis not present

## 2022-04-05 DIAGNOSIS — I10 Essential (primary) hypertension: Secondary | ICD-10-CM

## 2022-04-05 DIAGNOSIS — H35033 Hypertensive retinopathy, bilateral: Secondary | ICD-10-CM | POA: Diagnosis not present

## 2022-04-05 MED ORDER — BEVACIZUMAB CHEMO INJECTION 1.25MG/0.05ML SYRINGE FOR KALEIDOSCOPE
1.2500 mg | INTRAVITREAL | Status: AC | PRN
  Administered 2022-04-05: 1.25 mg via INTRAVITREAL

## 2022-04-05 NOTE — Progress Notes (Signed)
[facility] Note  04/05/2022     CHIEF COMPLAINT Patient presents for Retina Evaluation   HISTORY OF PRESENT ILLNESS: Jorge [name] is a 77 y.o. male who presents to the clinic today for:   HPI     Retina Evaluation   In left eye.  I, the attending physician,  performed the HPI with the patient and updated documentation appropriately.        Comments   Patient here for Retina Evaluation. Dr Zadie Rhine Patient. H/o CRVO OD. Patient states vision about the same. No eye pain. Not using drops.      Last edited by Bernarda Caffey, MD on 04/05/2022 11:58 AM.      Referring physician: Deland Pretty, MD [place],  [place] [id number]  HISTORICAL INFORMATION:   Selected notes from the MEDICAL RECORD NUMBER Pt transferring care from Dr. 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 (Ophthalmic Drugs)   No current facility-administered medications for this visit. (Ophthalmic Drugs)   Current Outpatient Medications (Other)  Medication Sig   aspirin EC 81 MG tablet Take 81 mg by mouth daily.   atorvastatin (LIPITOR) 80 MG tablet Take 40 mg by mouth daily.    ezetimibe (ZETIA) 10 MG tablet Take 10 mg by mouth daily with breakfast.   fish oil-omega-3 fatty acids 1000 MG capsule Take 2 g by mouth daily with breakfast.    metoprolol succinate (TOPROL-XL) 25 MG 24 hr tablet TAKE 1/2 TABLET BY MOUTH EVERY DAY WITH BREAKFAST   Multiple Vitamin (MULTIVITAMIN) capsule Take 1 capsule by mouth daily.     MYRBETRIQ 50 MG TB24 tablet Take 50 mg by mouth daily.   pantoprazole (PROTONIX) 40 MG tablet Take 1 tablet (40 mg total) by mouth daily.   Tofacitinib Citrate 5 MG TABS Take 5 mg by mouth daily.   amoxicillin (AMOXIL) 500 MG capsule Take 1 capsule by mouth every 6  (six) hours. (Patient not taking: Reported on 05/16/2021)   No current facility-administered medications for this visit. (Other)   REVIEW OF SYSTEMS: ROS   Positive for: Eyes Negative for: Constitutional, Gastrointestinal, Neurological, Skin, Genitourinary, Musculoskeletal, HENT, Endocrine, Cardiovascular, Respiratory, Psychiatric, Allergic/Imm, Heme/Lymph Last edited by Theodore Demark, COA on 04/05/2022  8:36 AM.     ALLERGIES Allergies  Allergen Reactions   Niacin Itching and Swelling   PAST MEDICAL HISTORY Past Medical History:  Diagnosis Date   Arthritis    CAD (coronary artery disease)    PTCA diagonal 2001, residual 60% LAD /  Nuclear 2006, no ischemia   Carotid artery disease ([place])    Doppler, October, 2012, 0-39% bilateral stenoses.   Diverticulitis    Dyslipidemia    Ejection fraction    EF normal,   Eye abnormalities 08-23-2011   right ryr injection for bleed done by dr Zadie Rhine, dr Zadie Rhine told pt ok to have surgery 09-03-2011   Family history of anesthesia complication    SISTER HAD MEMORY PROBLEMS POST OP   Nuclear sclerotic cataract of left eye 09/23/2019   The nature of cataract was discussed with the patient as well as the elective nature of surgery. The patient was reassured that surgery at a later date does not put the patient at risk for a worse outcome. It  was emphasized that the need for surgery is dictated by the patient's quality of life as influenced by the cataract. Patient was instructed to maintain close follow up with their general eye    Preop cardiovascular exam    Patient needs cardiac clearance for hernia surgery May, 2013   Psoriatic arthritis ([place])    2012   PVC's (premature ventricular contractions)    Frequent PVCs noted over the years., These always decrease with exercise. This is documented   Umbilical hernia 8/65/7846   Past Surgical History:  Procedure Laterality Date   ABLATION  06/09/2019   ANGIOPLASTY  12/1999 and 03/2000   CARDIAC  CATHETERIZATION  2001   carpel tunnel  2009/2010   both wrists   COLONOSCOPY N/A 10/30/2013   Procedure: COLONOSCOPY;  Surgeon: Beryle Beams, MD;  Location: [facility];  Service: Endoscopy;  Laterality: N/A;   ESOPHAGOGASTRODUODENOSCOPY N/A 10/30/2013   Procedure: ESOPHAGOGASTRODUODENOSCOPY (EGD);  Surgeon: Beryle Beams, MD;  [facility];  Service: Endoscopy;  Laterality: N/A;   INGUINAL HERNIA REPAIR  09/03/2011   Procedure: HERNIA REPAIR INGUINAL ADULT;  Surgeon: Imogene Burn. Georgette Dover, MD;  Location: [facility];  Service: General;  Laterality: Left;  Left Ingunal Hernia Repair with Mesh   PVC ABLATION N/A 06/09/2019   Procedure: PVC ABLATION;  Surgeon: Evans Lance, MD;  Location: [facility];  Service: Cardiovascular;  Laterality: N/A;   right knee meniscus repair  9629'B   UMBILICAL HERNIA REPAIR  09/03/2011   Procedure: HERNIA REPAIR UMBILICAL ADULT;  Surgeon: Imogene Burn. Tsuei, MD;  Location: [facility];  Service: General;  Laterality: N/A;   FAMILY HISTORY Family History  Problem Relation Age of Onset   Cancer Mother        uterine   Cancer Sister        Breast   Crohn's disease Cousin        distant   Colon cancer Neg Hx    Ulcerative colitis Neg Hx    SOCIAL HISTORY Social History   Tobacco Use   Smoking status: Former    Packs/day: 1.50    Years: 25.00    Total pack years: 37.50    Types: Cigarettes    Quit date: 12/08/1994    Years since quitting: 27.3   Smokeless tobacco: Former  Scientific laboratory technician Use: Never used  Substance Use Topics   Alcohol use: Yes    Comment: 2 drink per day   Drug use: No       OPHTHALMIC EXAM:  Base Eye Exam     Visual Acuity (Snellen - Linear)       Right Left   Dist cc 20/30 -2 20/20 -1   Dist ph cc 20/25 -1     Correction: Glasses         Tonometry (Tonopen, 8:31 AM)       Right Left   Pressure 13 13         Pupils       Dark Light Shape React APD   Right 3 2 Round Brisk None   Left 3 2 Round  Brisk None         Visual Fields (Counting fingers)       Left Right    Full Full         Extraocular Movement       Right Left    Full, Ortho Full, Ortho         Neuro/Psych  Oriented x3: Yes   Mood/Affect: Normal         Dilation     Both eyes: 1.0% Mydriacyl, 2.5% Phenylephrine @ 8:31 AM           Slit Lamp and Fundus Exam     Slit Lamp Exam       Right Left   Lids/Lashes Dermatochalasis - upper lid Dermatochalasis - upper lid   Conjunctiva/Sclera nasal pingeucula Mild nasal and temporal pinguecula   Cornea arcus, well healed cataract wound, trace PEE, mild tear film debris, small sub epi scar at 0400 paracentral arcus, tear film debris, 1+ Punctate epithelial erosions, well healed cataract wound   Anterior Chamber deep and clear deep and clear   Iris Round and dilated Round and dilated   Lens PC IOL in good position PC IOL in good position   Anterior Vitreous Vitreous syneresis, +silicone micro bubbles Mild syeresis         Fundus Exam       Right Left   Posterior Vitreous Posterior vitreous detachment Posterior vitreous detachment   Disc Pink and Sharp, vascular loops. Focal hyperemia nasally Pink and Sharp, mild PPA   C/D Ratio 0.2 0.3   Macula Flat, Blunted foveal reflex, focal IRF and edema temporal fovea and mac Flat, Good foveal reflex, RPE mottling, No heme or edema   Vessels attenuated, Tortuous, AV crossing changes, old CRVO mild attenuation, mild tortuosity, AV crossing changes   Periphery Attached, No heme Attached, No heme           Refraction     Wearing Rx       Sphere Cylinder Axis Add   Right +0.00 +2.25 001 +2.00   Left -0.50 +2.25 165 +2.00           IMAGING AND PROCEDURES  Imaging and Procedures for 04/05/2022  OCT, Retina - OU - Both Eyes       Right Eye Quality was good. Scan locations included subfoveal. Central Foveal Thickness: 300. Progression has no prior data. Findings include no SRF, abnormal  foveal contour, intraretinal fluid, central retinal atrophy (Mild IRF/edema temporal fovea and macula).   Left Eye Quality was good. Scan locations included subfoveal. Central Foveal Thickness: 279. Progression has no prior data. Findings include normal foveal contour, no IRF, no SRF.   Notes *Images captured and stored on drive  Diagnosis / Impression:  OD: CRVO w/ mild IRF/edema temporal fovea and macula OS: NFP, no IRF/SRF  Clinical management:  See below  Abbreviations: NFP - Normal foveal profile. CME - cystoid macular edema. PED - pigment epithelial detachment. IRF - intraretinal fluid. SRF - subretinal fluid. EZ - ellipsoid zone. ERM - epiretinal membrane. ORA - outer retinal atrophy. ORT - outer retinal tubulation. SRHM - subretinal hyper-reflective material. IRHM - intraretinal hyper-reflective material        Intravitreal Injection, Pharmacologic Agent - OD - Right Eye       Time Out 04/05/2022. 9:18 AM. Confirmed correct patient, procedure, site, and patient consented.   Anesthesia Topical anesthesia was used. Anesthetic medications included Lidocaine 2%, Proparacaine 0.5%.   Procedure Preparation included 5% betadine to ocular surface, eyelid speculum. A (32g) needle was used.   Injection: 1.25 mg Bevacizumab 1.40m/0.05ml   Route: Intravitreal, Site: Right Eye   NDC: 50242-060-01, Lot:: [id number] Expiration date: 04/17/2022   Post-op Post injection exam found visual acuity of at least counting fingers. The patient tolerated the procedure well. There were no complications. The patient received written  and verbal post procedure care education.            ASSESSMENT/PLAN:    ICD-10-CM   1. 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 Essential hypertension  I10     3. Hypertensive retinopathy  of both eyes  H35.033     4. Pseudophakia, both eyes  Z96.1      CRVO w/ CME, OD  - Dr. Zadie Rhine pt here due to insurance reasons  - onset of CRVO ~8 yrs ago per pt report (~2015-2016)  - history of multiple IVA OD -- on 14 wk maintenance schedule  - last injxn, IVA OD on 09.12.23 (18 wks since last injxn)  - BCVA 20/25 from 20/30 at [facility]'s last visit  - OCT shows mild IRF/cystic changes temporal mac and fovea at 18 wks  - recommend IVA OD #1 today, 01.18.24 w/ f/u in 12 wks  - pt wishes to proceed with injection  - RBA of procedure discussed, questions answered - informed consent obtained and signed  - see procedure note - f/u 12 wks -- DFE/OCT, possible injection  2,3. Hypertensive retinopathy OU - discussed importance of tight BP control - monitor  4. Pseudophakia OU  - s/p CE/IOL OU (Dr. Kathlen Mody)  - IOL in good position, doing well  - monitor  Ophthalmic Meds Ordered this visit:  Meds ordered this encounter  Medications   Bevacizumab (AVASTIN) SOLN 1.25 mg     Return in about 12 weeks (around 06/28/2022) for f/u CRVO OD, DFE, OCT.  There are no Patient Instructions on file for this visit.   Explained the diagnoses, plan, and follow up with the patient and they expressed understanding.  Patient expressed understanding of the importance of proper follow up care.   This document serves as a record of services personally performed by Gardiner Sleeper, MD, PhD. It was created on their behalf by San Jetty. Owens Shark, OA an ophthalmic technician. The creation of this record is the provider's dictation and/or activities during the visit.    Electronically signed by: San Jetty. Owens Shark, [place] 01.18.2024 11:59 AM  Gardiner Sleeper, M.D., Ph.D. Diseases & Surgery of the Retina and Vitreous [facility]  I have reviewed the above documentation for accuracy and completeness, and I agree with the above. Gardiner Sleeper, M.D., Ph.D. 04/05/22 12:08 PM  Abbreviations: M  myopia (nearsighted); A astigmatism; H hyperopia (farsighted); P presbyopia; Mrx spectacle prescription;  CTL contact lenses; OD right eye; OS left eye; OU both eyes  XT exotropia; ET esotropia; PEK punctate epithelial keratitis; PEE punctate epithelial erosions; DES dry eye syndrome; MGD meibomian gland dysfunction; ATs artificial tears; PFAT's preservative free artificial tears; [place] nuclear sclerotic cataract; PSC posterior subcapsular cataract; ERM epi-retinal membrane; PVD posterior vitreous detachment; RD retinal detachment; DM diabetes mellitus; DR diabetic retinopathy; NPDR non-proliferative diabetic retinopathy; PDR proliferative diabetic retinopathy; CSME clinically significant macular edema; DME diabetic macular edema; dbh dot blot hemorrhages; CWS cotton wool spot; POAG primary open angle glaucoma; C/D cup-to-disc ratio; HVF humphrey visual field; GVF goldmann visual field; OCT optical coherence tomography; IOP intraocular pressure; BRVO Branch retinal vein occlusion; CRVO central retinal vein occlusion; CRAO central retinal artery occlusion; BRAO branch retinal artery occlusion; RT retinal tear; SB scleral buckle; PPV pars plana vitrectomy;  VH Vitreous hemorrhage; PRP panretinal laser photocoagulation; IVK intravitreal kenalog; VMT vitreomacular traction; MH Macular hole;  NVD neovascularization of the disc; NVE neovascularization elsewhere; AREDS age related eye disease study; ARMD age related macular degeneration; POAG primary open angle glaucoma; EBMD epithelial/anterior basement membrane dystrophy; ACIOL anterior chamber intraocular lens; IOL intraocular lens; PCIOL posterior chamber intraocular lens; Phaco/IOL phacoemulsification with intraocular lens placement; [place] photorefractive keratectomy; LASIK laser assisted in situ keratomileusis; HTN hypertension; DM diabetes mellitus; COPD chronic obstructive pulmonary disease

## 2022-04-18 DIAGNOSIS — K5730 Diverticulosis of large intestine without perforation or abscess without bleeding: Secondary | ICD-10-CM | POA: Diagnosis not present

## 2022-04-18 DIAGNOSIS — I2510 Atherosclerotic heart disease of native coronary artery without angina pectoris: Secondary | ICD-10-CM | POA: Diagnosis not present

## 2022-04-18 DIAGNOSIS — Z23 Encounter for immunization: Secondary | ICD-10-CM | POA: Diagnosis not present

## 2022-04-18 DIAGNOSIS — Z86010 Personal history of colonic polyps: Secondary | ICD-10-CM | POA: Diagnosis not present

## 2022-06-11 ENCOUNTER — Other Ambulatory Visit (INDEPENDENT_AMBULATORY_CARE_PROVIDER_SITE_OTHER): Payer: Self-pay

## 2022-06-11 DIAGNOSIS — I10 Essential (primary) hypertension: Secondary | ICD-10-CM

## 2022-06-11 DIAGNOSIS — Z961 Presence of intraocular lens: Secondary | ICD-10-CM

## 2022-06-11 DIAGNOSIS — H35033 Hypertensive retinopathy, bilateral: Secondary | ICD-10-CM

## 2022-06-11 DIAGNOSIS — H34811 Central retinal vein occlusion, right eye, with macular edema: Secondary | ICD-10-CM

## 2022-06-11 NOTE — Progress Notes (Shared)
[facility] Note  06/11/2022     CHIEF COMPLAINT Patient presents for No chief complaint on file.   HISTORY OF PRESENT ILLNESS: Jorge [name] is a 77 y.o. male who presents to the clinic today for:      Referring physician: No referring provider defined for this encounter.  HISTORICAL INFORMATION:   Selected notes from the MEDICAL RECORD NUMBER Pt transferring care from Dr. 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 (Ophthalmic Drugs)   No current facility-administered medications for this visit. (Ophthalmic Drugs)   Current Outpatient Medications (Other)  Medication Sig   amoxicillin (AMOXIL) 500 MG capsule Take 1 capsule by mouth every 6 (six) hours. (Patient not taking: Reported on 05/16/2021)   aspirin EC 81 MG tablet Take 81 mg by mouth daily.   atorvastatin (LIPITOR) 80 MG tablet Take 40 mg by mouth daily.    ezetimibe (ZETIA) 10 MG tablet Take 10 mg by mouth daily with breakfast.   fish oil-omega-3 fatty acids 1000 MG capsule Take 2 g by mouth daily with breakfast.    metoprolol succinate (TOPROL-XL) 25 MG 24 hr tablet TAKE 1/2 TABLET BY MOUTH EVERY DAY WITH BREAKFAST   Multiple Vitamin (MULTIVITAMIN) capsule Take 1 capsule by mouth daily.     MYRBETRIQ 50 MG TB24 tablet Take 50 mg by mouth daily.   pantoprazole (PROTONIX) 40 MG tablet Take 1 tablet (40 mg total) by mouth daily.   Tofacitinib Citrate 5 MG TABS Take 5 mg by mouth daily.   No current facility-administered medications for this visit. (Other)   REVIEW OF SYSTEMS:   ALLERGIES Allergies  Allergen Reactions   Niacin Itching and Swelling   PAST MEDICAL HISTORY Past Medical History:  Diagnosis Date   Arthritis    CAD (coronary artery disease)    PTCA diagonal 2001, residual 60% LAD /   Nuclear 2006, no ischemia   Carotid artery disease ([place])    Doppler, October, 2012, 0-39% bilateral stenoses.   Diverticulitis    Dyslipidemia    Ejection fraction    EF normal,   Eye abnormalities 08-23-2011   right ryr injection for bleed done by dr Zadie Rhine, dr Zadie Rhine told pt ok to have surgery 09-03-2011   Family history of anesthesia complication    SISTER HAD MEMORY PROBLEMS POST OP   Nuclear sclerotic cataract of left eye 09/23/2019   The nature of cataract was discussed with the patient as well as the elective nature of surgery. The patient was reassured that surgery at a later date does not put the patient at risk for a worse outcome. It was emphasized that the need for surgery is dictated by the patient's quality of life as influenced by the cataract. Patient was instructed to maintain close follow up with their general eye    Preop cardiovascular exam    Patient needs cardiac clearance for hernia surgery May, 2013   Psoriatic arthritis ([place])    2012   PVC's (premature ventricular contractions)    Frequent PVCs noted over the years., These always decrease with exercise. This is documented   Umbilical hernia [id number]   Past Surgical History:  Procedure Laterality Date   ABLATION  06/09/2019   ANGIOPLASTY  12/1999 and 03/2000   CARDIAC CATHETERIZATION  2001  carpel tunnel  2009/2010   both wrists   COLONOSCOPY N/A 10/30/2013   Procedure: COLONOSCOPY;  Surgeon: Beryle Beams, MD;  Location: [place];  Service: Endoscopy;  Laterality: N/A;   ESOPHAGOGASTRODUODENOSCOPY N/A 10/30/2013   Procedure: ESOPHAGOGASTRODUODENOSCOPY (EGD);  Surgeon: Beryle Beams, MD;  [facility];  Service: Endoscopy;  Laterality: N/A;   INGUINAL HERNIA REPAIR  09/03/2011   Procedure: HERNIA REPAIR INGUINAL ADULT;  Surgeon: Imogene Burn. Georgette Dover, MD;  Location: [facility];  Service: General;  Laterality: Left;  Left Ingunal Hernia Repair with Mesh   PVC ABLATION N/A 06/09/2019   Procedure: PVC ABLATION;   Surgeon: Evans Lance, MD;  Location: [facility];  Service: Cardiovascular;  Laterality: N/A;   right knee meniscus repair  [id number]   UMBILICAL HERNIA REPAIR  09/03/2011   Procedure: HERNIA REPAIR UMBILICAL ADULT;  Surgeon: Imogene Burn. Georgette Dover, MD;  Location: [facility];  Service: General;  Laterality: N/A;   FAMILY HISTORY Family History  Problem Relation Age of Onset   Cancer Mother        uterine   Cancer Sister        Breast   Crohn's disease Cousin        distant   Colon cancer Neg Hx    Ulcerative colitis Neg Hx    SOCIAL HISTORY Social History   Tobacco Use   Smoking status: Former    Packs/day: 1.50    Years: 25.00    Additional pack years: 0.00    Total pack years: 37.50    Types: Cigarettes    Quit date: 12/08/1994    Years since quitting: 27.5   Smokeless tobacco: Former  Scientific laboratory technician Use: Never used  Substance Use Topics   Alcohol use: Yes    Comment: 2 drink per day   Drug use: No       OPHTHALMIC EXAM:  Not recorded    IMAGING AND PROCEDURES  Imaging and Procedures for 06/11/2022          ASSESSMENT/PLAN:    ICD-10-CM   1. Central retinal vein occlusion with macular edema of right eye  H34.8110     2. Essential hypertension  I10     3. Hypertensive retinopathy of both eyes  H35.033     4. Pseudophakia, both eyes  Z96.1     5. Macular puckering, right eye  H35.371     6. Vitreomacular adhesion of left eye  H43.822      CRVO w/ CME, OD  - Dr. Zadie Rhine pt here due to insurance reasons  - onset of CRVO ~8 yrs ago per pt report (~2015-2016)  - history of multiple IVA OD -- on 14 wk maintenance schedule  - last injxn, IVA OD on 09.12.23 (18 wks since last injxn)  - s/p IVA OD #1 (01.18.24)  - BCVA 20/25 from 20/30 at [facility]'s last visit  - OCT shows mild IRF/cystic changes temporal mac and fovea at 18 wks  - recommend IVA OD #2 today, 04.11.24 w/ f/u in 12 wks  - pt wishes to proceed with injection  - RBA of procedure  discussed, questions answered - informed consent obtained and signed  - see procedure note - f/u 12 wks -- DFE/OCT, possible injection  2,3. Hypertensive retinopathy OU - discussed importance of tight BP control - monitor  4. Pseudophakia OU  - s/p CE/IOL OU (Dr. Kathlen Mody)  - IOL in good position, doing well  - monitor  Ophthalmic Meds Ordered this visit:  No orders of the defined types were placed in this encounter.    No follow-ups on file.  There are no Patient Instructions on file for this visit.   Explained the diagnoses, plan, and follow up with the patient and they expressed understanding.  Patient expressed understanding of the importance of proper follow up care.   This document serves as a record of services personally performed by Gardiner Sleeper, MD, PhD. It was created on their behalf by San Jetty. Owens Shark, OA an ophthalmic technician. The creation of this record is the provider's dictation and/or activities during the visit.    Electronically signed by: San Jetty. Owens Shark, [place] 03.25.2024 12:31 PM   Gardiner Sleeper, M.D., Ph.D. Diseases & Surgery of the Retina and Vitreous [facility]: M myopia (nearsighted); A astigmatism; H hyperopia (farsighted); P presbyopia; Mrx spectacle prescription;  CTL contact lenses; OD right eye; OS left eye; OU both eyes  XT exotropia; ET esotropia; PEK punctate epithelial keratitis; PEE punctate epithelial erosions; DES dry eye syndrome; MGD meibomian gland dysfunction; ATs artificial tears; PFAT's preservative free artificial tears; [place] nuclear sclerotic cataract; PSC posterior subcapsular cataract; ERM epi-retinal membrane; PVD posterior vitreous detachment; RD retinal detachment; DM diabetes mellitus; DR diabetic retinopathy; NPDR non-proliferative diabetic retinopathy; PDR proliferative diabetic retinopathy; CSME clinically significant macular edema; DME diabetic macular edema; dbh dot blot hemorrhages;  CWS cotton wool spot; POAG primary open angle glaucoma; C/D cup-to-disc ratio; HVF humphrey visual field; GVF goldmann visual field; OCT optical coherence tomography; IOP intraocular pressure; BRVO Branch retinal vein occlusion; CRVO central retinal vein occlusion; CRAO central retinal artery occlusion; BRAO branch retinal artery occlusion; RT retinal tear; SB scleral buckle; PPV pars plana vitrectomy; VH Vitreous hemorrhage; PRP panretinal laser photocoagulation; IVK intravitreal kenalog; VMT vitreomacular traction; MH Macular hole;  NVD neovascularization of the disc; NVE neovascularization elsewhere; AREDS age related eye disease study; ARMD age related macular degeneration; POAG primary open angle glaucoma; EBMD epithelial/anterior basement membrane dystrophy; ACIOL anterior chamber intraocular lens; IOL intraocular lens; PCIOL posterior chamber intraocular lens; Phaco/IOL phacoemulsification with intraocular lens placement; [place] photorefractive keratectomy; LASIK laser assisted in situ keratomileusis; HTN hypertension; DM diabetes mellitus; COPD chronic obstructive pulmonary disease

## 2022-06-22 DIAGNOSIS — H02831 Dermatochalasis of right upper eyelid: Secondary | ICD-10-CM | POA: Diagnosis not present

## 2022-06-22 DIAGNOSIS — H40013 Open angle with borderline findings, low risk, bilateral: Secondary | ICD-10-CM | POA: Diagnosis not present

## 2022-06-22 DIAGNOSIS — H02403 Unspecified ptosis of bilateral eyelids: Secondary | ICD-10-CM | POA: Diagnosis not present

## 2022-06-22 DIAGNOSIS — H02834 Dermatochalasis of left upper eyelid: Secondary | ICD-10-CM | POA: Diagnosis not present

## 2022-06-27 NOTE — Progress Notes (Shared)
[facility] - Clinic Note  06/28/2022     CHIEF COMPLAINT Patient presents for Retina Follow Up   HISTORY OF PRESENT ILLNESS: Jorge [name] is a 77 y.o. male who presents to the clinic today for:   HPI     Retina Follow Up   Patient presents with  CRVO/BRVO.  In right eye.  This started 12 weeks ago.  Duration of 12 days.  Since onset it is stable.  I, the attending physician,  performed the HPI with the patient and updated documentation appropriately.        Comments   12 week retina follow up CRVO OD and IVA OD pt is reporting no vision changes noticed he denies any flashes or floaters       Last edited by Rennis Chris, MD on 06/28/2022  9:59 AM.       Referring physician: [name], MD [address] [place],  [place] [address]  HISTORICAL INFORMATION:   Selected notes from the MEDICAL RECORD NUMBER Pt transferring care from Dr. 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 (Ophthalmic Drugs)   No current facility-administered medications for this visit. (Ophthalmic Drugs)   Current Outpatient Medications (Other)  Medication Sig   amoxicillin (AMOXIL) 500 MG capsule Take 1 capsule by mouth every 6 (six) hours. (Patient not taking: Reported on 05/16/2021)   aspirin EC 81 MG tablet Take 81 mg by mouth daily.   atorvastatin (LIPITOR) 80 MG tablet Take 40 mg by mouth daily.    ezetimibe (ZETIA) 10 MG tablet Take 10 mg by mouth daily with breakfast.   fish oil-omega-3 fatty acids 1000 MG capsule Take 2 g by mouth daily with breakfast.    metoprolol succinate (TOPROL-XL) 25 MG 24 hr tablet TAKE 1/2 TABLET BY MOUTH EVERY DAY WITH BREAKFAST   Multiple Vitamin (MULTIVITAMIN) capsule Take 1 capsule by mouth daily.     MYRBETRIQ 50 MG TB24 tablet Take 50 mg by  mouth daily.   pantoprazole (PROTONIX) 40 MG tablet Take 1 tablet (40 mg total) by mouth daily.   Tofacitinib Citrate 5 MG TABS Take 5 mg by mouth daily.   No current facility-administered medications for this visit. (Other)   REVIEW OF SYSTEMS: ROS   Positive for: Eyes Negative for: Constitutional, Gastrointestinal, Neurological, Skin, Genitourinary, Musculoskeletal, HENT, Endocrine, Cardiovascular, Respiratory, Psychiatric, Allergic/Imm, Heme/Lymph Last edited by Etheleen Mayhew, COT on 06/28/2022  8:29 AM.      ALLERGIES Allergies  Allergen Reactions   Niacin Itching and Swelling   PAST MEDICAL HISTORY Past Medical History:  Diagnosis Date   Arthritis    CAD (coronary artery disease)    PTCA diagonal 2001, residual 60% LAD /  Nuclear 2006, no ischemia   Carotid artery disease    Doppler, October, 2012, 0-39% bilateral stenoses.   Diverticulitis    Dyslipidemia    Ejection fraction    EF normal,   Eye abnormalities 08-23-2011   right ryr injection for bleed done by dr Luciana Axe, dr Luciana Axe told pt ok to have surgery 09-03-2011   Family history of anesthesia complication    SISTER HAD MEMORY PROBLEMS POST OP   Nuclear sclerotic cataract of left eye 09/23/2019   The nature of cataract was discussed with the patient as well  as the elective nature of surgery. The patient was reassured that surgery at a later date does not put the patient at risk for a worse outcome. It was emphasized that the need for surgery is dictated by the patient's quality of life as influenced by the cataract. Patient was instructed to maintain close follow up with their general eye    Preop cardiovascular exam    Patient needs cardiac clearance for hernia surgery May, 2013   Psoriatic arthritis    2012   PVC's (premature ventricular contractions)    Frequent PVCs noted over the years., These always decrease with exercise. This is documented   Umbilical hernia 07/31/2011   Past Surgical History:   Procedure Laterality Date   ABLATION  06/09/2019   ANGIOPLASTY  12/1999 and 03/2000   CARDIAC CATHETERIZATION  2001   carpel tunnel  2009/2010   both wrists   COLONOSCOPY N/A 10/30/2013   Procedure: COLONOSCOPY;  Surgeon: Theda Belfast, MD;  Location: [facility] ENDOSCOPY;  Service: Endoscopy;  Laterality: N/A;   ESOPHAGOGASTRODUODENOSCOPY N/A 10/30/2013   Procedure: ESOPHAGOGASTRODUODENOSCOPY (EGD);  Surgeon: Theda Belfast, MD;  Location: [facility] ENDOSCOPY;  Service: Endoscopy;  Laterality: N/A;   INGUINAL HERNIA REPAIR  09/03/2011   Procedure: HERNIA REPAIR INGUINAL ADULT;  Surgeon: [name], MD;  Location: [facility] ORS;  Service: General;  Laterality: Left;  Left Ingunal Hernia Repair with Mesh   PVC ABLATION N/A 06/09/2019   Procedure: PVC ABLATION;  Surgeon: Marinus Maw, MD;  Location: [facility] INVASIVE CV LAB;  Service: Cardiovascular;  Laterality: N/A;   right knee meniscus repair  1990's   UMBILICAL HERNIA REPAIR  09/03/2011   Procedure: HERNIA REPAIR UMBILICAL ADULT;  Surgeon: Wilmon Arms. Tsuei, MD;  Location: [facility] ORS;  Service: General;  Laterality: N/A;   FAMILY HISTORY Family History  Problem Relation Age of Onset   Cancer Mother        uterine   Cancer Sister        Breast   Crohn's disease Cousin        distant   Colon cancer Neg Hx    Ulcerative colitis Neg Hx    SOCIAL HISTORY Social History   Tobacco Use   Smoking status: Former    Packs/day: 1.50    Years: 25.00    Additional pack years: 0.00    Total pack years: 37.50    Types: Cigarettes    Quit date: 12/08/1994    Years since quitting: 27.5   Smokeless tobacco: Former  Building services engineer Use: Never used  Substance Use Topics   Alcohol use: Yes    Comment: 2 drink per day   Drug use: No       OPHTHALMIC EXAM:  Base Eye Exam     Visual Acuity (Snellen - Linear)       Right Left   Dist cc 20/30 20/20 -3   Dist ph cc NI     Correction: Glasses         Tonometry (Tonopen, 8:33 AM)       Right Left    Pressure 13 13         Pupils       Pupils Dark Light Shape React APD   Right PERRL 3 2 Round Brisk None   Left PERRL 3 2 Round Brisk None         Visual Fields       Left Right    Full Full  Extraocular Movement       Right Left    Full, Ortho Full, Ortho         Neuro/Psych     Oriented x3: Yes   Mood/Affect: Normal         Dilation     Both eyes: 2.5% Phenylephrine @ 8:33 AM           Slit Lamp and Fundus Exam     Slit Lamp Exam       Right Left   Lids/Lashes Dermatochalasis - upper lid Dermatochalasis - upper lid   Conjunctiva/Sclera nasal pingeucula Mild nasal and temporal pinguecula   Cornea arcus, well healed cataract wound, trace PEE, mild tear film debris, small sub epi scar at 0400 paracentral arcus, tear film debris, 1+ Punctate epithelial erosions, well healed cataract wound   Anterior Chamber deep and clear deep and clear   Iris Round and dilated Round and dilated   Lens PC IOL in good position PC IOL in good position   Anterior Vitreous Vitreous syneresis, +silicone micro bubbles Mild syeresis         Fundus Exam       Right Left   Posterior Vitreous Posterior vitreous detachment Posterior vitreous detachment   Disc Pink and Sharp, vascular loops, focal hyperemia nasally Pink and Sharp, mild PPA   C/D Ratio 0.2 0.3   Macula Flat, Blunted foveal reflex, focal IRF and edema temporal fovea and mac -- slightly increased Flat, Good foveal reflex, RPE mottling, No heme or edema   Vessels attenuated, Tortuous, AV crossing changes, old CRVO attenuated, mild tortuosity   Periphery Attached, No heme Attached, No heme           Refraction     Wearing Rx       Sphere Cylinder Axis Add   Right +0.00 +2.25 001 +2.00   Left -0.50 +2.25 165 +2.00           IMAGING AND PROCEDURES  Imaging and Procedures for 06/28/2022  OCT, Retina - OU - Both Eyes       Right Eye Quality was good. Scan locations included  subfoveal. Central Foveal Thickness: 303. Progression has worsened. Findings include no SRF, abnormal foveal contour, intraretinal fluid, central retinal atrophy (Mild interval increase in IRF temporal fovea and macula).   Left Eye Quality was good. Scan locations included subfoveal. Central Foveal Thickness: 279. Progression has been stable. Findings include normal foveal contour, no IRF, no SRF.   Notes *Images captured and stored on drive  Diagnosis / Impression:  OD: CRVO w/ mild interval increase in IRF temporal fovea and macula OS: NFP, no IRF/SRF  Clinical management:  See below  Abbreviations: NFP - Normal foveal profile. CME - cystoid macular edema. PED - pigment epithelial detachment. IRF - intraretinal fluid. SRF - subretinal fluid. EZ - ellipsoid zone. ERM - epiretinal membrane. ORA - outer retinal atrophy. ORT - outer retinal tubulation. SRHM - subretinal hyper-reflective material. IRHM - intraretinal hyper-reflective material        Intravitreal Injection, Pharmacologic Agent - OD - Right Eye       Time Out 06/28/2022. 8:40 AM. Confirmed correct patient, procedure, site, and patient consented.   Anesthesia Topical anesthesia was used. Anesthetic medications included Lidocaine 2%, Proparacaine 0.5%.   Procedure Preparation included 5% betadine to ocular surface, eyelid speculum. A supplied (32g) needle was used.   Injection: 1.25 mg Bevacizumab 1.25mg /0.58ml   Route: Intravitreal, Site: Right Eye   NDC: [id number], Lot: [id number],  Expiration date: 08/05/2022   Post-op Post injection exam found visual acuity of at least counting fingers. The patient tolerated the procedure well. There were no complications. The patient received written and verbal post procedure care education.             ASSESSMENT/PLAN:    ICD-10-CM   1. Central retinal vein occlusion with macular edema of right eye  H34.8110 OCT, Retina - OU - Both Eyes    Intravitreal Injection,  Pharmacologic Agent - OD - Right Eye    Bevacizumab (AVASTIN) SOLN 1.25 mg    2. Essential hypertension  I10     3. Hypertensive retinopathy of both eyes  H35.033     4. Pseudophakia, both eyes  Z96.1      CRVO w/ CME, OD  - Dr. Luciana Axe pt here due to insurance reasons  - onset of CRVO ~8 yrs ago per pt report (~2015-2016)  - history of multiple IVA OD -- on 14 wk maintenance schedule w/ [facility]  - last injxn w [facility] -- IVA OD on 09.12.23 (18 wks prior to 1st visit/injxn here)  - s/p IVA OD #1 (01.18.24)  - BCVA 20/30 from 20/25  - OCT shows mild interval increase in IRF temporal fovea and macula at 12 wks  - recommend IVA OD #2 today, 04.11.24 w/ f/u decreased to 6 wks  - pt wishes to proceed with injection  - RBA of procedure discussed, questions answered - informed consent obtained and signed  - see procedure note - will check [facility] auth for next visit - f/u 6 wks -- DFE/OCT, possible injection  2,3. Hypertensive retinopathy OU - discussed importance of tight BP control - monitor  4. Pseudophakia OU  - s/p CE/IOL OU (Dr. Alben Spittle)  - IOL in good position, doing well  - monitor  Ophthalmic Meds Ordered this visit:  Meds ordered this encounter  Medications   Bevacizumab (AVASTIN) SOLN 1.25 mg     Return in about 6 weeks (around 08/09/2022) for f/u CRVO OD, DFE, OCT.  There are no Patient Instructions on file for this visit.   Explained the diagnoses, plan, and follow up with the patient and they expressed understanding.  Patient expressed understanding of the importance of proper follow up care.   This document serves as a record of services personally performed by Karie Chimera, MD, PhD. It was created on their behalf by Glee Arvin. Manson Passey, OA an ophthalmic technician. The creation of this record is the provider's dictation and/or activities during the visit.    Electronically signed by: Glee Arvin. [place], [place] 03.25.2024 2:11 PM   Karie Chimera, M.D., Ph.D. Diseases  & Surgery of the Retina and Vitreous [facility]  I have reviewed the above documentation for accuracy and completeness, and I agree with the above. Karie Chimera, M.D., Ph.D. 06/28/22 2:11 PM  Abbreviations: M myopia (nearsighted); A astigmatism; H hyperopia (farsighted); P presbyopia; Mrx spectacle prescription;  CTL contact lenses; OD right eye; OS left eye; OU both eyes  XT exotropia; ET esotropia; PEK punctate epithelial keratitis; PEE punctate epithelial erosions; DES dry eye syndrome; MGD meibomian gland dysfunction; ATs artificial tears; PFAT's preservative free artificial tears; NSC nuclear sclerotic cataract; PSC posterior subcapsular cataract; ERM epi-retinal membrane; PVD posterior vitreous detachment; RD retinal detachment; DM diabetes mellitus; DR diabetic retinopathy; NPDR non-proliferative diabetic retinopathy; PDR proliferative diabetic retinopathy; CSME clinically significant macular edema; DME diabetic macular edema; dbh dot blot hemorrhages; CWS cotton wool  spot; POAG primary open angle glaucoma; C/D cup-to-disc ratio; HVF humphrey visual field; GVF goldmann visual field; OCT optical coherence tomography; IOP intraocular pressure; BRVO Branch retinal vein occlusion; CRVO central retinal vein occlusion; CRAO central retinal artery occlusion; BRAO branch retinal artery occlusion; RT retinal tear; SB scleral buckle; PPV pars plana vitrectomy; VH Vitreous hemorrhage; PRP panretinal laser photocoagulation; IVK intravitreal kenalog; VMT vitreomacular traction; MH Macular hole;  NVD neovascularization of the disc; NVE neovascularization elsewhere; AREDS age related eye disease study; ARMD age related macular degeneration; POAG primary open angle glaucoma; EBMD epithelial/anterior basement membrane dystrophy; ACIOL anterior chamber intraocular lens; IOL intraocular lens; PCIOL posterior chamber intraocular lens; Phaco/IOL phacoemulsification with intraocular lens placement;  PRK photorefractive keratectomy; LASIK laser assisted in situ keratomileusis; HTN hypertension; DM diabetes mellitus; COPD chronic obstructive pulmonary disease

## 2022-06-28 ENCOUNTER — Ambulatory Visit (INDEPENDENT_AMBULATORY_CARE_PROVIDER_SITE_OTHER): Payer: Medicare Other | Admitting: Ophthalmology

## 2022-06-28 ENCOUNTER — Encounter (INDEPENDENT_AMBULATORY_CARE_PROVIDER_SITE_OTHER): Payer: Self-pay | Admitting: Ophthalmology

## 2022-06-28 DIAGNOSIS — H35033 Hypertensive retinopathy, bilateral: Secondary | ICD-10-CM | POA: Diagnosis not present

## 2022-06-28 DIAGNOSIS — H34811 Central retinal vein occlusion, right eye, with macular edema: Secondary | ICD-10-CM

## 2022-06-28 DIAGNOSIS — H43822 Vitreomacular adhesion, left eye: Secondary | ICD-10-CM

## 2022-06-28 DIAGNOSIS — Z961 Presence of intraocular lens: Secondary | ICD-10-CM

## 2022-06-28 DIAGNOSIS — I10 Essential (primary) hypertension: Secondary | ICD-10-CM

## 2022-06-28 DIAGNOSIS — H35371 Puckering of macula, right eye: Secondary | ICD-10-CM

## 2022-06-28 MED ORDER — BEVACIZUMAB CHEMO INJECTION 1.25MG/0.05ML SYRINGE FOR KALEIDOSCOPE
1.2500 mg | INTRAVITREAL | Status: AC | PRN
Start: 2022-06-28 — End: 2022-06-28
  Administered 2022-06-28: 1.25 mg via INTRAVITREAL

## 2022-07-26 NOTE — Progress Notes (Signed)
[facility] - Clinic Note  08/09/2022     CHIEF COMPLAINT Patient presents for Retina Follow Up   HISTORY OF PRESENT ILLNESS: Jorge [name] is a 77 y.o. male who presents to the clinic today for:   HPI     Retina Follow Up   Patient presents with  CRVO/BRVO.  In right eye.  Severity is moderate.  Duration of 6 weeks.  Since onset it is stable.  I, the attending physician,  performed the HPI with the patient and updated documentation appropriately.        Comments   Pt here for 6 wk ret f/u CRVO OD. PT states VA is the same, no changes.       Last edited by Rennis Chris, MD on 08/09/2022  3:23 PM.     Referring physician: [name], MD [address] [place],  [place] [address]  HISTORICAL INFORMATION:   Selected notes from the MEDICAL RECORD NUMBER Pt transferring care from Dr. 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 (Ophthalmic Drugs)   No current facility-administered medications for this visit. (Ophthalmic Drugs)   Current Outpatient Medications (Other)  Medication Sig   amoxicillin (AMOXIL) 500 MG capsule Take 1 capsule by mouth every 6 (six) hours.   aspirin EC 81 MG tablet Take 81 mg by mouth daily.   atorvastatin (LIPITOR) 80 MG tablet Take 40 mg by mouth daily.    ezetimibe (ZETIA) 10 MG tablet Take 10 mg by mouth daily with breakfast.   fish oil-omega-3 fatty acids 1000 MG capsule Take 2 g by mouth daily with breakfast.    metoprolol succinate (TOPROL-XL) 25 MG 24 hr tablet TAKE 1/2 TABLET BY MOUTH EVERY DAY WITH BREAKFAST   Multiple Vitamin (MULTIVITAMIN) capsule Take 1 capsule by mouth daily.     MYRBETRIQ 50 MG TB24 tablet Take 50 mg by mouth daily.   pantoprazole (PROTONIX) 40 MG tablet Take 1 tablet (40 mg total) by mouth daily.    Tofacitinib Citrate 5 MG TABS Take 5 mg by mouth daily.   No current facility-administered medications for this visit. (Other)   REVIEW OF SYSTEMS: ROS   Positive for: Gastrointestinal, Cardiovascular, Eyes Negative for: Constitutional, Neurological, Skin, Genitourinary, Musculoskeletal, HENT, Endocrine, Respiratory, Psychiatric, Allergic/Imm, Heme/Lymph Last edited by Thompson Grayer, COT on 08/09/2022  8:25 AM.     ALLERGIES Allergies  Allergen Reactions   Niacin Itching and Swelling   PAST MEDICAL HISTORY Past Medical History:  Diagnosis Date   Arthritis    CAD (coronary artery disease)    PTCA diagonal 2001, residual 60% LAD /  Nuclear 2006, no ischemia   Carotid artery disease (HCC)    Doppler, October, 2012, 0-39% bilateral stenoses.   Diverticulitis    Dyslipidemia    Ejection fraction    EF normal,   Eye abnormalities 08-23-2011   right ryr injection for bleed done by dr Luciana Axe, dr Luciana Axe told pt ok to have surgery 09-03-2011   Family history of anesthesia complication    SISTER HAD MEMORY PROBLEMS POST OP   Nuclear sclerotic cataract of left eye 09/23/2019   The nature of cataract was discussed with the patient as well as the elective nature of surgery. The patient was reassured that surgery at a later date  does not put the patient at risk for a worse outcome. It was emphasized that the need for surgery is dictated by the patient's quality of life as influenced by the cataract. Patient was instructed to maintain close follow up with their general eye    Preop cardiovascular exam    Patient needs cardiac clearance for hernia surgery May, 2013   Psoriatic arthritis (HCC)    2012   PVC's (premature ventricular contractions)    Frequent PVCs noted over the years., These always decrease with exercise. This is documented   Umbilical hernia 07/31/2011   Past Surgical History:  Procedure Laterality Date   ABLATION  06/09/2019   ANGIOPLASTY  12/1999 and 03/2000   CARDIAC  CATHETERIZATION  2001   carpel tunnel  2009/2010   both wrists   COLONOSCOPY N/A 10/30/2013   Procedure: COLONOSCOPY;  Surgeon: Theda Belfast, MD;  Location: [facility] ENDOSCOPY;  Service: Endoscopy;  Laterality: N/A;   ESOPHAGOGASTRODUODENOSCOPY N/A 10/30/2013   Procedure: ESOPHAGOGASTRODUODENOSCOPY (EGD);  Surgeon: Theda Belfast, MD;  Location[facility] ENDOSCOPY;  Service: Endoscopy;  Laterality: N/A;   INGUINAL HERNIA REPAIR  09/03/2011   Procedure: HERNIA REPAIR INGUINAL ADULT;  Surgeon: [name], MD;  Location: [facility] ORS;  Service: General;  Laterality: Left;  Left Ingunal Hernia Repair with Mesh   PVC ABLATION N/A 06/09/2019   Procedure: PVC ABLATION;  Surgeon: Marinus Maw, MD;  Location: [facility] INVASIVE CV LAB;  Service: Cardiovascular;  Laterality: N/A;   right knee meniscus repair  1990's   UMBILICAL HERNIA REPAIR  09/03/2011   Procedure: HERNIA REPAIR UMBILICAL ADULT;  Surgeon: Wilmon Arms. Tsuei, MD;  Location: [facility] ORS;  Service: General;  Laterality: N/A;   FAMILY HISTORY Family History  Problem Relation Age of Onset   Cancer Mother        uterine   Cancer Sister        Breast   Crohn's disease Cousin        distant   Colon cancer Neg Hx    Ulcerative colitis Neg Hx    SOCIAL HISTORY Social History   Tobacco Use   Smoking status: Former    Packs/day: 1.50    Years: 25.00    Additional pack years: 0.00    Total pack years: 37.50    Types: Cigarettes    Quit date: 12/08/1994    Years since quitting: 27.6   Smokeless tobacco: Former  Building services engineer Use: Never used  Substance Use Topics   Alcohol use: Yes    Comment: 2 drink per day   Drug use: No       OPHTHALMIC EXAM:  Base Eye Exam     Visual Acuity (Snellen - Linear)       Right Left   Dist cc 20/40 +1 20/20 -1   Dist ph cc 20/30 +1     Correction: Glasses         Tonometry (Tonopen, 8:30 AM)       Right Left   Pressure 17 17         Pupils       Pupils Dark Light Shape React APD   Right  PERRL 3 2 Round Brisk None   Left PERRL 3 2 Round Brisk None         Visual Fields (Counting fingers)       Left Right    Full Full         Extraocular Movement  Right Left    Full, Ortho Full, Ortho         Neuro/Psych     Oriented x3: Yes   Mood/Affect: Normal         Dilation     Both eyes: 1.0% Mydriacyl, 2.5% Phenylephrine @ 8:30 AM           Slit Lamp and Fundus Exam     Slit Lamp Exam       Right Left   Lids/Lashes Dermatochalasis - upper lid Dermatochalasis - upper lid   Conjunctiva/Sclera nasal pingeucula Mild nasal and temporal pinguecula   Cornea arcus, well healed cataract wound, trace PEE, mild tear film debris, small sub epi scar at 0400 paracentral arcus, tear film debris, 1+ Punctate epithelial erosions, well healed cataract wound   Anterior Chamber deep and clear deep and clear   Iris Round and dilated Round and dilated   Lens PC IOL in good position PC IOL in good position   Anterior Vitreous Vitreous syneresis, +silicone micro bubbles Mild syeresis         Fundus Exam       Right Left   Posterior Vitreous Posterior vitreous detachment Posterior vitreous detachment   Disc Pink and Sharp, vascular loops, +hyperemia, PPP temporally Pink and Sharp, mild PPA   C/D Ratio 0.2 0.3   Macula Flat, Blunted foveal reflex, focal IRF and edema temporal fovea and mac -- improved Flat, Good foveal reflex, RPE mottling, No heme or edema   Vessels attenuated, Tortuous, AV crossing changes, old CRVO attenuated, Tortuous   Periphery Attached, No heme Attached, No heme           Refraction     Wearing Rx       Sphere Cylinder Axis Add   Right +0.00 +2.25 001 +2.00   Left -0.50 +2.25 165 +2.00           IMAGING AND PROCEDURES  Imaging and Procedures for 08/09/2022  OCT, Retina - OU - Both Eyes       Right Eye Quality was good. Scan locations included subfoveal. Central Foveal Thickness: 262. Progression has improved. Findings  include normal foveal contour, no IRF, no SRF, abnormal foveal contour, central retinal atrophy (interval improvement in IRF temporal fovea and macula -- essentially resolved).   Left Eye Quality was good. Scan locations included subfoveal. Central Foveal Thickness: 279. Progression has been stable. Findings include normal foveal contour, no IRF, no SRF.   Notes *Images captured and stored on drive  Diagnosis / Impression:  OD: CRVO w/ interval improvement in IRF temporal fovea and macula -- essentially resolved OS: NFP, no IRF/SRF  Clinical management:  See below  Abbreviations: NFP - Normal foveal profile. CME - cystoid macular edema. PED - pigment epithelial detachment. IRF - intraretinal fluid. SRF - subretinal fluid. EZ - ellipsoid zone. ERM - epiretinal membrane. ORA - outer retinal atrophy. ORT - outer retinal tubulation. SRHM - subretinal hyper-reflective material. IRHM - intraretinal hyper-reflective material        Intravitreal Injection, Pharmacologic Agent - OD - Right Eye       Time Out 08/09/2022. 9:01 AM. Confirmed correct patient, procedure, site, and patient consented.   Anesthesia Topical anesthesia was used. Anesthetic medications included Lidocaine 2%, Proparacaine 0.5%.   Procedure Preparation included 5% betadine to ocular surface, eyelid speculum. A (32g) needle was used.   Injection: 1.25 mg Bevacizumab 1.25mg /0.9ml   Route: Intravitreal, Site: Right Eye   NDC: [id number], Lot: [id number], Expiration date:  11/17/2022   Post-op Post injection exam found visual acuity of at least counting fingers. The patient tolerated the procedure well. There were no complications. The patient received written and verbal post procedure care education.            ASSESSMENT/PLAN:   ICD-10-CM   1. Central retinal vein occlusion with macular edema of right eye  H34.8110 OCT, Retina - OU - Both Eyes    Intravitreal Injection, Pharmacologic Agent - OD - Right Eye     Bevacizumab (AVASTIN) SOLN 1.25 mg    2. Essential hypertension  I10     3. Hypertensive retinopathy of both eyes  H35.033     4. Pseudophakia, both eyes  Z96.1      CRVO w/ CME, OD  - Dr. Luciana Axe pt here due to insurance reasons  - onset of CRVO ~8 yrs ago per pt report (~2015-2016)  - history of multiple IVA OD -- on 14 wk maintenance schedule w/ [facility]  - last injxn w [facility] -- IVA OD on 09.12.23 (18 wks prior to 1st visit/injxn here)  - s/p IVA OD #1 (01.18.24), #2 (04.11.24)  - BCVA stable at 20/30   - OCT shows interval improvement in IRF temporal fovea and macula -- essentially resolved at 6 wks  - recommend IVA OD #3 today, 05.23.24 w/ f/u extended to 8 wks  - pt wishes to proceed with injection  - RBA of procedure discussed, questions answered - informed consent obtained and signed  - see procedure note - will check [employer] auth for next visit - f/u 8 wks -- DFE/OCT, possible injection  2,3. Hypertensive retinopathy OU - discussed importance of tight BP control - monitor  4. Pseudophakia OU  - s/p CE/IOL OU (Dr. Alben Spittle)  - IOL in good position, doing well  - monitor  Ophthalmic Meds Ordered this visit:  Meds ordered this encounter  Medications   Bevacizumab (AVASTIN) SOLN 1.25 mg     Return in about 8 weeks (around 10/04/2022) for f/u CRVO OD, DFE, OCT.  There are no Patient Instructions on file for this visit.   Explained the diagnoses, plan, and follow up with the patient and they expressed understanding.  Patient expressed understanding of the importance of proper follow up care.   This document serves as a record of services personally performed by Karie Chimera, MD, PhD. It was created on their behalf by Glee Arvin. Manson Passey, OA an ophthalmic technician. The creation of this record is the provider's dictation and/or activities during the visit.    Electronically signed by: Glee Arvin. Manson Passey, [place] 05.09.2024 3:26 PM  Karie Chimera, M.D., Ph.D. Diseases &  Surgery of the Retina and Vitreous [facility]  I have reviewed the above documentation for accuracy and completeness, and I agree with the above. Karie Chimera, M.D., Ph.D. 08/09/22 3:27 PM   Abbreviations: M myopia (nearsighted); A astigmatism; H hyperopia (farsighted); P presbyopia; Mrx spectacle prescription;  CTL contact lenses; OD right eye; OS left eye; OU both eyes  XT exotropia; ET esotropia; PEK punctate epithelial keratitis; PEE punctate epithelial erosions; DES dry eye syndrome; MGD meibomian gland dysfunction; ATs artificial tears; PFAT's preservative free artificial tears; NSC nuclear sclerotic cataract; PSC posterior subcapsular cataract; ERM epi-retinal membrane; PVD posterior vitreous detachment; RD retinal detachment; DM diabetes mellitus; DR diabetic retinopathy; NPDR non-proliferative diabetic retinopathy; PDR proliferative diabetic retinopathy; CSME clinically significant macular edema; DME diabetic macular edema; dbh dot blot hemorrhages; CWS cotton  wool spot; POAG primary open angle glaucoma; C/D cup-to-disc ratio; HVF humphrey visual field; GVF goldmann visual field; OCT optical coherence tomography; IOP intraocular pressure; BRVO Branch retinal vein occlusion; CRVO central retinal vein occlusion; CRAO central retinal artery occlusion; BRAO branch retinal artery occlusion; RT retinal tear; SB scleral buckle; PPV pars plana vitrectomy; VH Vitreous hemorrhage; PRP panretinal laser photocoagulation; IVK intravitreal kenalog; VMT vitreomacular traction; MH Macular hole;  NVD neovascularization of the disc; NVE neovascularization elsewhere; AREDS age related eye disease study; ARMD age related macular degeneration; POAG primary open angle glaucoma; EBMD epithelial/anterior basement membrane dystrophy; ACIOL anterior chamber intraocular lens; IOL intraocular lens; PCIOL posterior chamber intraocular lens; Phaco/IOL phacoemulsification with intraocular lens placement;  PRK photorefractive keratectomy; LASIK laser assisted in situ keratomileusis; HTN hypertension; DM diabetes mellitus; COPD chronic obstructive pulmonary disease

## 2022-08-09 ENCOUNTER — Ambulatory Visit (INDEPENDENT_AMBULATORY_CARE_PROVIDER_SITE_OTHER): Payer: Medicare Other | Admitting: Ophthalmology

## 2022-08-09 ENCOUNTER — Encounter (INDEPENDENT_AMBULATORY_CARE_PROVIDER_SITE_OTHER): Payer: Self-pay | Admitting: Ophthalmology

## 2022-08-09 DIAGNOSIS — H34811 Central retinal vein occlusion, right eye, with macular edema: Secondary | ICD-10-CM

## 2022-08-09 DIAGNOSIS — Z961 Presence of intraocular lens: Secondary | ICD-10-CM | POA: Diagnosis not present

## 2022-08-09 DIAGNOSIS — I10 Essential (primary) hypertension: Secondary | ICD-10-CM

## 2022-08-09 DIAGNOSIS — H35033 Hypertensive retinopathy, bilateral: Secondary | ICD-10-CM

## 2022-08-09 MED ORDER — BEVACIZUMAB CHEMO INJECTION 1.25MG/0.05ML SYRINGE FOR KALEIDOSCOPE
1.2500 mg | INTRAVITREAL | Status: AC | PRN
Start: 2022-08-09 — End: 2022-08-09
  Administered 2022-08-09: 1.25 mg via INTRAVITREAL

## 2022-08-21 ENCOUNTER — Encounter (INDEPENDENT_AMBULATORY_CARE_PROVIDER_SITE_OTHER): Payer: Medicare Other | Admitting: Ophthalmology

## 2022-09-10 DIAGNOSIS — K080 Exfoliation of teeth due to systemic causes: Secondary | ICD-10-CM | POA: Diagnosis not present

## 2022-09-24 NOTE — Progress Notes (Addendum)
[facility] - Clinic Note  10/04/2022     CHIEF COMPLAINT Patient presents for Retina Follow Up   HISTORY OF PRESENT ILLNESS: Jorge [name] is a 77 y.o. male who presents to the clinic today for:   HPI     Retina Follow Up   Patient presents with  CRVO/BRVO.  In right eye.  This started 8 weeks ago.  I, the attending physician,  performed the HPI with the patient and updated documentation appropriately.        Comments   Patient here for 8 weeks retina follow up for CRVO OD. Patient states vision about the same. No eye pain. Not using gtts.      Last edited by Rennis Chris, MD on 10/04/2022  2:28 PM.    Pt states vision is the same  Referring physician: [name], MD [address] [place],  [place] [address]  HISTORICAL INFORMATION:   Selected notes from the MEDICAL RECORD NUMBER Pt transferring care from Dr. 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 (Ophthalmic Drugs)   No current facility-administered medications for this visit. (Ophthalmic Drugs)   Current Outpatient Medications (Other)  Medication Sig   amoxicillin (AMOXIL) 500 MG capsule Take 1 capsule by mouth every 6 (six) hours.   aspirin EC 81 MG tablet Take 81 mg by mouth daily.   atorvastatin (LIPITOR) 80 MG tablet Take 40 mg by mouth daily.    ezetimibe (ZETIA) 10 MG tablet Take 10 mg by mouth daily with breakfast.   fish oil-omega-3 fatty acids 1000 MG capsule Take 2 g by mouth daily with breakfast.    metoprolol succinate (TOPROL-XL) 25 MG 24 hr tablet TAKE 1/2 TABLET BY MOUTH EVERY DAY WITH BREAKFAST   Multiple Vitamin (MULTIVITAMIN) capsule Take 1 capsule by mouth daily.     MYRBETRIQ 50 MG TB24 tablet Take 50 mg by mouth daily.   pantoprazole (PROTONIX) 40 MG tablet Take 1 tablet  (40 mg total) by mouth daily.   Tofacitinib Citrate 5 MG TABS Take 5 mg by mouth daily.   No current facility-administered medications for this visit. (Other)   REVIEW OF SYSTEMS: ROS   Positive for: Gastrointestinal, Cardiovascular, Eyes Negative for: Constitutional, Neurological, Skin, Genitourinary, Musculoskeletal, HENT, Endocrine, Respiratory, Psychiatric, Allergic/Imm, Heme/Lymph Last edited by Laddie Aquas, COA on 10/04/2022  8:44 AM.      ALLERGIES Allergies  Allergen Reactions   Niacin Itching and Swelling   PAST MEDICAL HISTORY Past Medical History:  Diagnosis Date   Arthritis    CAD (coronary artery disease)    PTCA diagonal 2001, residual 60% LAD /  Nuclear 2006, no ischemia   Carotid artery disease (HCC)    Doppler, October, 2012, 0-39% bilateral stenoses.   Diverticulitis    Dyslipidemia    Ejection fraction    EF normal,   Eye abnormalities 08-23-2011   right ryr injection for bleed done by dr Luciana Axe, dr Luciana Axe told pt ok to have surgery 09-03-2011   Family history of anesthesia complication    SISTER HAD MEMORY PROBLEMS POST OP   Nuclear sclerotic cataract of left eye 09/23/2019   The nature of cataract was discussed with the patient as well as the elective nature of surgery. The patient was reassured that surgery at  a later date does not put the patient at risk for a worse outcome. It was emphasized that the need for surgery is dictated by the patient's quality of life as influenced by the cataract. Patient was instructed to maintain close follow up with their general eye    Preop cardiovascular exam    Patient needs cardiac clearance for hernia surgery May, 2013   Psoriatic arthritis (HCC)    2012   PVC's (premature ventricular contractions)    Frequent PVCs noted over the years., These always decrease with exercise. This is documented   Umbilical hernia 07/31/2011   Past Surgical History:  Procedure Laterality Date   ABLATION  06/09/2019   ANGIOPLASTY   12/1999 and 03/2000   CARDIAC CATHETERIZATION  2001   carpel tunnel  2009/2010   both wrists   COLONOSCOPY N/A 10/30/2013   Procedure: COLONOSCOPY;  Surgeon: Theda Belfast, MD;  Location: [facility] ENDOSCOPY;  Service: Endoscopy;  Laterality: N/A;   ESOPHAGOGASTRODUODENOSCOPY N/A 10/30/2013   Procedure: ESOPHAGOGASTRODUODENOSCOPY (EGD);  Surgeon: Theda Belfast, MD;  Location: [facility] ENDOSCOPY;  Service: Endoscopy;  Laterality: N/A;   INGUINAL HERNIA REPAIR  09/03/2011   Procedure: HERNIA REPAIR INGUINAL ADULT;  Surgeon: [name], MD;  Location: [facility] ORS;  Service: General;  Laterality: Left;  Left Ingunal Hernia Repair with Mesh   PVC ABLATION N/A 06/09/2019   Procedure: PVC ABLATION;  Surgeon: Marinus Maw, MD;  Location: [facility] INVASIVE CV LAB;  Service: Cardiovascular;  Laterality: N/A;   right knee meniscus repair  1990's   UMBILICAL HERNIA REPAIR  09/03/2011   Procedure: HERNIA REPAIR UMBILICAL ADULT;  Surgeon: Wilmon Arms. Tsuei, MD;  Location: [facility] ORS;  Service: General;  Laterality: N/A;   FAMILY HISTORY Family History  Problem Relation Age of Onset   Cancer Mother        uterine   Cancer Sister        Breast   Crohn's disease Cousin        distant   Colon cancer Neg Hx    Ulcerative colitis Neg Hx    SOCIAL HISTORY Social History   Tobacco Use   Smoking status: Former    Current packs/day: 0.00    Average packs/day: 1.5 packs/day for 25.0 years (37.5 ttl pk-yrs)    Types: Cigarettes    Start date: 12/07/1969    Quit date: 12/08/1994    Years since quitting: 27.8   Smokeless tobacco: Former  Building services engineer status: Never Used  Substance Use Topics   Alcohol use: Yes    Comment: 2 drink per day   Drug use: No       OPHTHALMIC EXAM:  Base Eye Exam     Visual Acuity (Snellen - Linear)       Right Left   Dist cc 20/40 20/20   Dist ph cc 20/25     Correction: Glasses         Tonometry (Tonopen, 8:42 AM)       Right Left   Pressure 18 13         Pupils        Dark Light Shape React APD   Right 3 2 Round Brisk None   Left 3 2 Round Brisk None         Visual Fields (Counting fingers)       Left Right    Full Full         Extraocular Movement  Right Left    Full, Ortho Full, Ortho         Neuro/Psych     Oriented x3: Yes   Mood/Affect: Normal         Dilation     Both eyes: 1.0% Mydriacyl, 2.5% Phenylephrine @ 8:42 AM           Slit Lamp and Fundus Exam     Slit Lamp Exam       Right Left   Lids/Lashes Dermatochalasis - upper lid Dermatochalasis - upper lid   Conjunctiva/Sclera nasal pingeucula Mild nasal and temporal pinguecula   Cornea arcus, well healed cataract wound, trace PEE, mild tear film debris, small sub epi scar at 0400 paracentral arcus, tear film debris, 1+ Punctate epithelial erosions, well healed cataract wound   Anterior Chamber deep and clear deep and clear   Iris Round and dilated Round and dilated   Lens PC IOL in good position PC IOL in good position   Anterior Vitreous Vitreous syneresis, +silicone micro bubbles Mild syeresis         Fundus Exam       Right Left   Posterior Vitreous Posterior vitreous detachment Posterior vitreous detachment   Disc Pink and Sharp, vascular loops, +hyperemia, PPP temporally Pink and Sharp, mild PPA   C/D Ratio 0.2 0.3   Macula Flat, Blunted foveal reflex, focal IRF and edema temporal fovea and mac -- slightly increased Flat, Good foveal reflex, RPE mottling, No heme or edema   Vessels attenuated, Tortuous, old CRVO attenuated, mild tortuosity   Periphery Attached, No heme Attached, No heme           Refraction     Wearing Rx       Sphere Cylinder Axis Add   Right +0.00 +2.25 001 +2.00   Left -0.50 +2.25 165 +2.00           IMAGING AND PROCEDURES  Imaging and Procedures for 10/04/2022  OCT, Retina - OU - Both Eyes       Right Eye Quality was good. Scan locations included subfoveal. Central Foveal Thickness: 267.  Progression has worsened. Findings include normal foveal contour, no IRF, no SRF, abnormal foveal contour, central retinal atrophy (interval increase in IRF temporal fovea and macula ).   Left Eye Quality was good. Scan locations included subfoveal. Central Foveal Thickness: 282. Progression has been stable. Findings include normal foveal contour, no IRF, no SRF.   Notes *Images captured and stored on drive  Diagnosis / Impression:  OD: CRVO w/ interval increase in IRF temporal fovea and macula  OS: NFP, no IRF/SRF  Clinical management:  See below  Abbreviations: NFP - Normal foveal profile. CME - cystoid macular edema. PED - pigment epithelial detachment. IRF - intraretinal fluid. SRF - subretinal fluid. EZ - ellipsoid zone. ERM - epiretinal membrane. ORA - outer retinal atrophy. ORT - outer retinal tubulation. SRHM - subretinal hyper-reflective material. IRHM - intraretinal hyper-reflective material        Intravitreal Injection, Pharmacologic Agent - OD - Right Eye       Time Out 10/04/2022. 10:00 AM. Confirmed correct patient, procedure, site, and patient consented.   Anesthesia Topical anesthesia was used. Anesthetic medications included Lidocaine 2%, Proparacaine 0.5%.   Procedure Preparation included 5% betadine to ocular surface, eyelid speculum. A (32g) needle was used.   Injection: 1.25 mg Bevacizumab 1.25mg /0.5ml   Route: Intravitreal, Site: Right Eye   NDC: [id number], Lot: [id number], Expiration date: 12/24/2022   Post-op Post  injection exam found visual acuity of at least counting fingers. The patient tolerated the procedure well. There were no complications. The patient received written and verbal post procedure care education.             ASSESSMENT/PLAN:   ICD-10-CM   1. Central retinal vein occlusion with macular edema of right eye  H34.8110 OCT, Retina - OU - Both Eyes    Intravitreal Injection, Pharmacologic Agent - OD - Right Eye    Bevacizumab  (AVASTIN) SOLN 1.25 mg    2. Essential hypertension  I10     3. Hypertensive retinopathy of both eyes  H35.033     4. Pseudophakia, both eyes  Z96.1      CRVO w/ CME, OD  - Dr. Luciana Axe pt here due to insurance reasons  - onset of CRVO ~8 yrs ago per pt report (~2015-2016)  - history of multiple IVA OD -- on 14 wk maintenance schedule w/ [facility]  - last injxn w [facility] -- IVA OD on 09.12.23 (18 wks prior to 1st visit/injxn here)  - s/p IVA OD #1 (01.18.24), #2 (04.11.24), #3 (05.23.24)  - BCVA improved to 20/25   - OCT shows interval increase in IRF temporal fovea and macula at 8 wks  - recommend IVA OD #4 today, 07.18.24 w/ f/u back to 6-7 wks  - pt wishes to proceed with injection  - RBA of procedure discussed, questions answered - informed consent obtained and signed  - see procedure note - will check [employer] auth for next visit - f/u 6-7 wks -- DFE/OCT, possible injection  2,3. Hypertensive retinopathy OU - discussed importance of tight BP control - monitor  4. Pseudophakia OU  - s/p CE/IOL OU (Dr. Alben Spittle)  - IOL in good position, doing well  - monitor  Ophthalmic Meds Ordered this visit:  Meds ordered this encounter  Medications   Bevacizumab (AVASTIN) SOLN 1.25 mg     Return for f/u 6-7 weeks, CRVO OD, DFE, OCT.  There are no Patient Instructions on file for this visit.   Explained the diagnoses, plan, and follow up with the patient and they expressed understanding.  Patient expressed understanding of the importance of proper follow up care.   This document serves as a record of services personally performed by Karie Chimera, MD, PhD. It was created on their behalf by Glee Arvin. Manson Passey, OA an ophthalmic technician. The creation of this record is the provider's dictation and/or activities during the visit.    Electronically signed by: Glee Arvin. Manson Passey, OA 10/04/22 2:29 PM   Karie Chimera, M.D., Ph.D. Diseases & Surgery of the Retina and Vitreous [facility]   I have reviewed the above documentation for accuracy and completeness, and I agree with the above. Karie Chimera, M.D., Ph.D. 10/04/22 2:30 PM   Abbreviations: M myopia (nearsighted); A astigmatism; H hyperopia (farsighted); P presbyopia; Mrx spectacle prescription;  CTL contact lenses; OD right eye; OS left eye; OU both eyes  XT exotropia; ET esotropia; PEK punctate epithelial keratitis; PEE punctate epithelial erosions; DES dry eye syndrome; MGD meibomian gland dysfunction; ATs artificial tears; PFAT's preservative free artificial tears; NSC nuclear sclerotic cataract; PSC posterior subcapsular cataract; ERM epi-retinal membrane; PVD posterior vitreous detachment; RD retinal detachment; DM diabetes mellitus; DR diabetic retinopathy; NPDR non-proliferative diabetic retinopathy; PDR proliferative diabetic retinopathy; CSME clinically significant macular edema; DME diabetic macular edema; dbh dot blot hemorrhages; CWS cotton wool spot; POAG primary open angle glaucoma;  C/D cup-to-disc ratio; HVF humphrey visual field; GVF goldmann visual field; OCT optical coherence tomography; IOP intraocular pressure; BRVO Branch retinal vein occlusion; CRVO central retinal vein occlusion; CRAO central retinal artery occlusion; BRAO branch retinal artery occlusion; RT retinal tear; SB scleral buckle; PPV pars plana vitrectomy; VH Vitreous hemorrhage; PRP panretinal laser photocoagulation; IVK intravitreal kenalog; VMT vitreomacular traction; MH Macular hole;  NVD neovascularization of the disc; NVE neovascularization elsewhere; AREDS age related eye disease study; ARMD age related macular degeneration; POAG primary open angle glaucoma; EBMD epithelial/anterior basement membrane dystrophy; ACIOL anterior chamber intraocular lens; IOL intraocular lens; PCIOL posterior chamber intraocular lens; Phaco/IOL phacoemulsification with intraocular lens placement; PRK photorefractive keratectomy; LASIK laser  assisted in situ keratomileusis; HTN hypertension; DM diabetes mellitus; COPD chronic obstructive pulmonary disease

## 2022-09-27 DIAGNOSIS — J3489 Other specified disorders of nose and nasal sinuses: Secondary | ICD-10-CM | POA: Diagnosis not present

## 2022-10-04 ENCOUNTER — Encounter (INDEPENDENT_AMBULATORY_CARE_PROVIDER_SITE_OTHER): Payer: Self-pay | Admitting: Ophthalmology

## 2022-10-04 ENCOUNTER — Ambulatory Visit (INDEPENDENT_AMBULATORY_CARE_PROVIDER_SITE_OTHER): Payer: Medicare Other | Admitting: Ophthalmology

## 2022-10-04 DIAGNOSIS — Z961 Presence of intraocular lens: Secondary | ICD-10-CM | POA: Diagnosis not present

## 2022-10-04 DIAGNOSIS — I10 Essential (primary) hypertension: Secondary | ICD-10-CM

## 2022-10-04 DIAGNOSIS — H34811 Central retinal vein occlusion, right eye, with macular edema: Secondary | ICD-10-CM | POA: Diagnosis not present

## 2022-10-04 DIAGNOSIS — H35033 Hypertensive retinopathy, bilateral: Secondary | ICD-10-CM

## 2022-10-04 MED ORDER — BEVACIZUMAB CHEMO INJECTION 1.25MG/0.05ML SYRINGE FOR KALEIDOSCOPE
1.2500 mg | INTRAVITREAL | Status: AC | PRN
Start: 2022-10-04 — End: 2022-10-04
  Administered 2022-10-04: 1.25 mg via INTRAVITREAL

## 2022-10-04 NOTE — Progress Notes (Signed)
[facility] - Clinic Note  10/04/2022     CHIEF COMPLAINT Patient presents for Retina Follow Up   HISTORY OF PRESENT ILLNESS: Jorge [name] is a 77 y.o. male who presents to the clinic today for:   HPI     Retina Follow Up   Patient presents with  CRVO/BRVO.  In right eye.  This started 8 weeks ago.  I, the attending physician,  performed the HPI with the patient and updated documentation appropriately.        Comments   Patient here for 8 weeks retina follow up for CRVO OD. Patient states vision about the same. No eye pain. Not using gtts.      Last edited by Rennis Chris, MD on 10/04/2022  2:28 PM.    Pt states vision is the same  Referring physician: [name], MD [address] [place],  [place] [address]  HISTORICAL INFORMATION:   Selected notes from the MEDICAL RECORD NUMBER Pt transferring care from Dr. 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 (Ophthalmic Drugs)   No current facility-administered medications for this visit. (Ophthalmic Drugs)   Current Outpatient Medications (Other)  Medication Sig   amoxicillin (AMOXIL) 500 MG capsule Take 1 capsule by mouth every 6 (six) hours.   aspirin EC 81 MG tablet Take 81 mg by mouth daily.   atorvastatin (LIPITOR) 80 MG tablet Take 40 mg by mouth daily.    ezetimibe (ZETIA) 10 MG tablet Take 10 mg by mouth daily with breakfast.   fish oil-omega-3 fatty acids 1000 MG capsule Take 2 g by mouth daily with breakfast.    metoprolol succinate (TOPROL-XL) 25 MG 24 hr tablet TAKE 1/2 TABLET BY MOUTH EVERY DAY WITH BREAKFAST   Multiple Vitamin (MULTIVITAMIN) capsule Take 1 capsule by mouth daily.     MYRBETRIQ 50 MG TB24 tablet Take 50 mg by mouth daily.   pantoprazole (PROTONIX) 40 MG tablet Take 1 tablet  (40 mg total) by mouth daily.   Tofacitinib Citrate 5 MG TABS Take 5 mg by mouth daily.   No current facility-administered medications for this visit. (Other)   REVIEW OF SYSTEMS: ROS   Positive for: Gastrointestinal, Cardiovascular, Eyes Negative for: Constitutional, Neurological, Skin, Genitourinary, Musculoskeletal, HENT, Endocrine, Respiratory, Psychiatric, Allergic/Imm, Heme/Lymph Last edited by Laddie Aquas, COA on 10/04/2022  8:44 AM.      ALLERGIES Allergies  Allergen Reactions   Niacin Itching and Swelling   PAST MEDICAL HISTORY Past Medical History:  Diagnosis Date   Arthritis    CAD (coronary artery disease)    PTCA diagonal 2001, residual 60% LAD /  Nuclear 2006, no ischemia   Carotid artery disease (HCC)    Doppler, October, 2012, 0-39% bilateral stenoses.   Diverticulitis    Dyslipidemia    Ejection fraction    EF normal,   Eye abnormalities 08-23-2011   right ryr injection for bleed done by dr Luciana Axe, dr Luciana Axe told pt ok to have surgery 09-03-2011   Family history of anesthesia complication    SISTER HAD MEMORY PROBLEMS POST OP   Nuclear sclerotic cataract of left eye 09/23/2019   The nature of cataract was discussed with the patient as well as the elective nature of surgery. The patient was reassured that surgery at  a later date does not put the patient at risk for a worse outcome. It was emphasized that the need for surgery is dictated by the patient's quality of life as influenced by the cataract. Patient was instructed to maintain close follow up with their general eye    Preop cardiovascular exam    Patient needs cardiac clearance for hernia surgery May, 2013   Psoriatic arthritis (HCC)    2012   PVC's (premature ventricular contractions)    Frequent PVCs noted over the years., These always decrease with exercise. This is documented   Umbilical hernia 07/31/2011   Past Surgical History:  Procedure Laterality Date   ABLATION  06/09/2019   ANGIOPLASTY   12/1999 and 03/2000   CARDIAC CATHETERIZATION  2001   carpel tunnel  2009/2010   both wrists   COLONOSCOPY N/A 10/30/2013   Procedure: COLONOSCOPY;  Surgeon: Theda Belfast, MD;  Location: [facility] ENDOSCOPY;  Service: Endoscopy;  Laterality: N/A;   ESOPHAGOGASTRODUODENOSCOPY N/A 10/30/2013   Procedure: ESOPHAGOGASTRODUODENOSCOPY (EGD);  Surgeon: Theda Belfast, MD;  Location: [facility] ENDOSCOPY;  Service: Endoscopy;  Laterality: N/A;   INGUINAL HERNIA REPAIR  09/03/2011   Procedure: HERNIA REPAIR INGUINAL ADULT;  Surgeon: [name], MD;  Location: [facility] ORS;  Service: General;  Laterality: Left;  Left Ingunal Hernia Repair with Mesh   PVC ABLATION N/A 06/09/2019   Procedure: PVC ABLATION;  Surgeon: Marinus Maw, MD;  Location: [facility] INVASIVE CV LAB;  Service: Cardiovascular;  Laterality: N/A;   right knee meniscus repair  1990's   UMBILICAL HERNIA REPAIR  09/03/2011   Procedure: HERNIA REPAIR UMBILICAL ADULT;  Surgeon: Wilmon Arms. Tsuei, MD;  Location: [facility] ORS;  Service: General;  Laterality: N/A;   FAMILY HISTORY Family History  Problem Relation Age of Onset   Cancer Mother        uterine   Cancer Sister        Breast   Crohn's disease Cousin        distant   Colon cancer Neg Hx    Ulcerative colitis Neg Hx    SOCIAL HISTORY Social History   Tobacco Use   Smoking status: Former    Current packs/day: 0.00    Average packs/day: 1.5 packs/day for 25.0 years (37.5 ttl pk-yrs)    Types: Cigarettes    Start date: 12/07/1969    Quit date: 12/08/1994    Years since quitting: 27.8   Smokeless tobacco: Former  Building services engineer status: Never Used  Substance Use Topics   Alcohol use: Yes    Comment: 2 drink per day   Drug use: No       OPHTHALMIC EXAM:  Base Eye Exam     Visual Acuity (Snellen - Linear)       Right Left   Dist cc 20/40 20/20   Dist ph cc 20/25     Correction: Glasses         Tonometry (Tonopen, 8:42 AM)       Right Left   Pressure 18 13         Pupils        Dark Light Shape React APD   Right 3 2 Round Brisk None   Left 3 2 Round Brisk None         Visual Fields (Counting fingers)       Left Right    Full Full         Extraocular Movement  Right Left    Full, Ortho Full, Ortho         Neuro/Psych     Oriented x3: Yes   Mood/Affect: Normal         Dilation     Both eyes: 1.0% Mydriacyl, 2.5% Phenylephrine @ 8:42 AM           Slit Lamp and Fundus Exam     Slit Lamp Exam       Right Left   Lids/Lashes Dermatochalasis - upper lid Dermatochalasis - upper lid   Conjunctiva/Sclera nasal pingeucula Mild nasal and temporal pinguecula   Cornea arcus, well healed cataract wound, trace PEE, mild tear film debris, small sub epi scar at 0400 paracentral arcus, tear film debris, 1+ Punctate epithelial erosions, well healed cataract wound   Anterior Chamber deep and clear deep and clear   Iris Round and dilated Round and dilated   Lens PC IOL in good position PC IOL in good position   Anterior Vitreous Vitreous syneresis, +silicone micro bubbles Mild syeresis         Fundus Exam       Right Left   Posterior Vitreous Posterior vitreous detachment Posterior vitreous detachment   Disc Pink and Sharp, vascular loops, +hyperemia, PPP temporally Pink and Sharp, mild PPA   C/D Ratio 0.2 0.3   Macula Flat, Blunted foveal reflex, focal IRF and edema temporal fovea and mac -- slightly increased Flat, Good foveal reflex, RPE mottling, No heme or edema   Vessels attenuated, Tortuous, old CRVO attenuated, mild tortuosity   Periphery Attached, No heme Attached, No heme           Refraction     Wearing Rx       Sphere Cylinder Axis Add   Right +0.00 +2.25 001 +2.00   Left -0.50 +2.25 165 +2.00           IMAGING AND PROCEDURES  Imaging and Procedures for 10/04/2022  OCT, Retina - OU - Both Eyes       Right Eye Quality was good. Scan locations included subfoveal. Central Foveal Thickness: 267.  Progression has worsened. Findings include normal foveal contour, no IRF, no SRF, abnormal foveal contour, central retinal atrophy (interval increase in IRF temporal fovea and macula ).   Left Eye Quality was good. Scan locations included subfoveal. Central Foveal Thickness: 282. Progression has been stable. Findings include normal foveal contour, no IRF, no SRF.   Notes *Images captured and stored on drive  Diagnosis / Impression:  OD: CRVO w/ interval increase in IRF temporal fovea and macula  OS: NFP, no IRF/SRF  Clinical management:  See below  Abbreviations: NFP - Normal foveal profile. CME - cystoid macular edema. PED - pigment epithelial detachment. IRF - intraretinal fluid. SRF - subretinal fluid. EZ - ellipsoid zone. ERM - epiretinal membrane. ORA - outer retinal atrophy. ORT - outer retinal tubulation. SRHM - subretinal hyper-reflective material. IRHM - intraretinal hyper-reflective material        Intravitreal Injection, Pharmacologic Agent - OD - Right Eye       Time Out 10/04/2022. 10:00 AM. Confirmed correct patient, procedure, site, and patient consented.   Anesthesia Topical anesthesia was used. Anesthetic medications included Lidocaine 2%, Proparacaine 0.5%.   Procedure Preparation included 5% betadine to ocular surface, eyelid speculum. A (32g) needle was used.   Injection: 1.25 mg Bevacizumab 1.25mg /0.44ml   Route: Intravitreal, Site: Right Eye   NDC: [id number], Lot: [id number], Expiration date: 12/24/2022   Post-op Post  injection exam found visual acuity of at least counting fingers. The patient tolerated the procedure well. There were no complications. The patient received written and verbal post procedure care education.             ASSESSMENT/PLAN:   ICD-10-CM   1. Central retinal vein occlusion with macular edema of right eye  H34.8110 OCT, Retina - OU - Both Eyes    Intravitreal Injection, Pharmacologic Agent - OD - Right Eye    Bevacizumab  (AVASTIN) SOLN 1.25 mg    2. Essential hypertension  I10     3. Hypertensive retinopathy of both eyes  H35.033     4. Pseudophakia, both eyes  Z96.1      CRVO w/ CME, OD  - Dr. Luciana Axe pt here due to insurance reasons  - onset of CRVO ~8 yrs ago per pt report (~2015-2016)  - history of multiple IVA OD -- on 14 wk maintenance schedule w/ [facility]  - last injxn w [facility] -- IVA OD on 09.12.23 (18 wks prior to 1st visit/injxn here)  - s/p IVA OD #1 (01.18.24), #2 (04.11.24), #3 (05.23.24)  - BCVA improved to 20/25   - OCT shows interval increase in IRF temporal fovea and macula at 8 wks  - recommend IVA OD #4 today, 07.18.24 w/ f/u back to 6-7 wks  - pt wishes to proceed with injection  - RBA of procedure discussed, questions answered - informed consent obtained and signed  - see procedure note - will check [facility] auth for next visit - f/u 6-7 wks -- DFE/OCT, possible injection  2,3. Hypertensive retinopathy OU - discussed importance of tight BP control - monitor  4. Pseudophakia OU  - s/p CE/IOL OU (Dr. Alben Spittle)  - IOL in good position, doing well  - monitor  Ophthalmic Meds Ordered this visit:  Meds ordered this encounter  Medications   Bevacizumab (AVASTIN) SOLN 1.25 mg     Return for f/u 6-7 weeks, CRVO OD, DFE, OCT.  There are no Patient Instructions on file for this visit.   Explained the diagnoses, plan, and follow up with the patient and they expressed understanding.  Patient expressed understanding of the importance of proper follow up care.   This document serves as a record of services personally performed by Karie Chimera, MD, PhD. It was created on their behalf by Glee Arvin. Manson Passey, OA an ophthalmic technician. The creation of this record is the provider's dictation and/or activities during the visit.    Electronically signed by: Glee Arvin. Manson Passey, OA 10/04/22 2:31 PM   Karie Chimera, M.D., Ph.D. Diseases & Surgery of the Retina and Vitreous [facility]   I have reviewed the above documentation for accuracy and completeness, and I agree with the above. Karie Chimera, M.D., Ph.D. 10/04/22 2:31 PM   Abbreviations: M myopia (nearsighted); A astigmatism; H hyperopia (farsighted); P presbyopia; Mrx spectacle prescription;  CTL contact lenses; OD right eye; OS left eye; OU both eyes  XT exotropia; ET esotropia; PEK punctate epithelial keratitis; PEE punctate epithelial erosions; DES dry eye syndrome; MGD meibomian gland dysfunction; ATs artificial tears; PFAT's preservative free artificial tears; NSC nuclear sclerotic cataract; PSC posterior subcapsular cataract; ERM epi-retinal membrane; PVD posterior vitreous detachment; RD retinal detachment; DM diabetes mellitus; DR diabetic retinopathy; NPDR non-proliferative diabetic retinopathy; PDR proliferative diabetic retinopathy; CSME clinically significant macular edema; DME diabetic macular edema; dbh dot blot hemorrhages; CWS cotton wool spot; POAG primary open angle glaucoma;  C/D cup-to-disc ratio; HVF humphrey visual field; GVF goldmann visual field; OCT optical coherence tomography; IOP intraocular pressure; BRVO Branch retinal vein occlusion; CRVO central retinal vein occlusion; CRAO central retinal artery occlusion; BRAO branch retinal artery occlusion; RT retinal tear; SB scleral buckle; PPV pars plana vitrectomy; VH Vitreous hemorrhage; PRP panretinal laser photocoagulation; IVK intravitreal kenalog; VMT vitreomacular traction; MH Macular hole;  NVD neovascularization of the disc; NVE neovascularization elsewhere; AREDS age related eye disease study; ARMD age related macular degeneration; POAG primary open angle glaucoma; EBMD epithelial/anterior basement membrane dystrophy; ACIOL anterior chamber intraocular lens; IOL intraocular lens; PCIOL posterior chamber intraocular lens; Phaco/IOL phacoemulsification with intraocular lens placement; PRK photorefractive keratectomy; LASIK laser  assisted in situ keratomileusis; HTN hypertension; DM diabetes mellitus; COPD chronic obstructive pulmonary disease

## 2022-10-05 DIAGNOSIS — D225 Melanocytic nevi of trunk: Secondary | ICD-10-CM | POA: Diagnosis not present

## 2022-10-05 DIAGNOSIS — L570 Actinic keratosis: Secondary | ICD-10-CM | POA: Diagnosis not present

## 2022-10-05 DIAGNOSIS — X32XXXD Exposure to sunlight, subsequent encounter: Secondary | ICD-10-CM | POA: Diagnosis not present

## 2022-10-05 DIAGNOSIS — Z1283 Encounter for screening for malignant neoplasm of skin: Secondary | ICD-10-CM | POA: Diagnosis not present

## 2022-11-08 NOTE — Progress Notes (Addendum)
[facility] - Clinic Note  11/22/2022     CHIEF COMPLAINT Patient presents for Retina Follow Up   HISTORY OF PRESENT ILLNESS: Jorge [name] is a 77 y.o. male who presents to the clinic today for:   HPI     Retina Follow Up   Patient presents with  CRVO/BRVO.  In right eye.  This started 7 weeks ago.  Duration of 7 weeks.  Since onset it is stable.  I, the attending physician,  performed the HPI with the patient and updated documentation appropriately.        Comments   7 week retina follow up CRVO OD and IVA OD pt is reporting no vision changes noticed he denies any flashes or floaters      Last edited by Rennis Chris, MD on 11/22/2022 10:32 AM.    Pt states vision is the same  Referring physician: [name], MD [address] [place],  [place] [address]  HISTORICAL INFORMATION:   Selected notes from the MEDICAL RECORD NUMBER Pt transferring care from Dr. 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 (Ophthalmic Drugs)   No current facility-administered medications for this visit. (Ophthalmic Drugs)   Current Outpatient Medications (Other)  Medication Sig   amoxicillin (AMOXIL) 500 MG capsule Take 1 capsule by mouth every 6 (six) hours.   aspirin EC 81 MG tablet Take 81 mg by mouth daily.   atorvastatin (LIPITOR) 80 MG tablet Take 40 mg by mouth daily.    ezetimibe (ZETIA) 10 MG tablet Take 10 mg by mouth daily with breakfast.   fish oil-omega-3 fatty acids 1000 MG capsule Take 2 g by mouth daily with breakfast.    metoprolol succinate (TOPROL-XL) 25 MG 24 hr tablet TAKE 1/2 TABLET BY MOUTH EVERY DAY WITH BREAKFAST   Multiple Vitamin (MULTIVITAMIN) capsule Take 1 capsule by mouth daily.     MYRBETRIQ 50 MG TB24 tablet Take 50 mg by mouth daily.    pantoprazole (PROTONIX) 40 MG tablet Take 1 tablet (40 mg total) by mouth daily.   Tofacitinib Citrate 5 MG TABS Take 5 mg by mouth daily.   No current facility-administered medications for this visit. (Other)   REVIEW OF SYSTEMS: ROS   Positive for: Gastrointestinal, Cardiovascular, Eyes Negative for: Constitutional, Neurological, Skin, Genitourinary, Musculoskeletal, HENT, Endocrine, Respiratory, Psychiatric, Allergic/Imm, Heme/Lymph Last edited by Etheleen Mayhew, COT on 11/22/2022  8:28 AM.       ALLERGIES Allergies  Allergen Reactions   Niacin Itching and Swelling   PAST MEDICAL HISTORY Past Medical History:  Diagnosis Date   Arthritis    CAD (coronary artery disease)    PTCA diagonal 2001, residual 60% LAD /  Nuclear 2006, no ischemia   Carotid artery disease (HCC)    Doppler, October, 2012, 0-39% bilateral stenoses.   Diverticulitis    Dyslipidemia    Ejection fraction    EF normal,   Eye abnormalities 08-23-2011   right ryr injection for bleed done by dr Luciana Axe, dr Luciana Axe told pt ok to have surgery 09-03-2011   Family history of anesthesia complication    SISTER HAD MEMORY PROBLEMS POST OP   Nuclear sclerotic cataract of left eye 09/23/2019   The nature of cataract was discussed with the patient as well as the  elective nature of surgery. The patient was reassured that surgery at a later date does not put the patient at risk for a worse outcome. It was emphasized that the need for surgery is dictated by the patient's quality of life as influenced by the cataract. Patient was instructed to maintain close follow up with their general eye    Preop cardiovascular exam    Patient needs cardiac clearance for hernia surgery May, 2013   Psoriatic arthritis (HCC)    2012   PVC's (premature ventricular contractions)    Frequent PVCs noted over the years., These always decrease with exercise. This is documented   Umbilical hernia 07/31/2011   Past Surgical History:  Procedure  Laterality Date   ABLATION  06/09/2019   ANGIOPLASTY  12/1999 and 03/2000   CARDIAC CATHETERIZATION  2001   carpel tunnel  2009/2010   both wrists   COLONOSCOPY N/A 10/30/2013   Procedure: COLONOSCOPY;  Surgeon: Theda Belfast, MD;  Location[facility] ENDOSCOPY;  Service: Endoscopy;  Laterality: N/A;   ESOPHAGOGASTRODUODENOSCOPY N/A 10/30/2013   Procedure: ESOPHAGOGASTRODUODENOSCOPY (EGD);  Surgeon: Theda Belfast, MD;  Location: [facility] ENDOSCOPY;  Service: Endoscopy;  Laterality: N/A;   INGUINAL HERNIA REPAIR  09/03/2011   Procedure: HERNIA REPAIR INGUINAL ADULT;  Surgeon: [name], MD;  Location: [facility] ORS;  Service: General;  Laterality: Left;  Left Ingunal Hernia Repair with Mesh   PVC ABLATION N/A 06/09/2019   Procedure: PVC ABLATION;  Surgeon: Marinus Maw, MD;  Location: [facility] INVASIVE CV LAB;  Service: Cardiovascular;  Laterality: N/A;   right knee meniscus repair  1990's   UMBILICAL HERNIA REPAIR  09/03/2011   Procedure: HERNIA REPAIR UMBILICAL ADULT;  Surgeon: [name], MD;  Location: [facility] ORS;  Service: General;  Laterality: N/A;   FAMILY HISTORY Family History  Problem Relation Age of Onset   Cancer Mother        uterine   Cancer Sister        Breast   Crohn's disease Cousin        distant   Colon cancer Neg Hx    Ulcerative colitis Neg Hx    SOCIAL HISTORY Social History   Tobacco Use   Smoking status: Former    Current packs/day: 0.00    Average packs/day: 1.5 packs/day for 25.0 years (37.5 ttl pk-yrs)    Types: Cigarettes    Start date: 12/07/1969    Quit date: 12/08/1994    Years since quitting: 27.9   Smokeless tobacco: Former  Building services engineer status: Never Used  Substance Use Topics   Alcohol use: Yes    Comment: 2 drink per day   Drug use: No       OPHTHALMIC EXAM:  Base Eye Exam     Visual Acuity (Snellen - Linear)       Right Left   Dist cc 20/40 -1 20/20   Dist ph cc NI     Correction: Glasses         Tonometry (Tonopen, 8:32 AM)        Right Left   Pressure 17 15         Pupils       Pupils Dark Light Shape React APD   Right PERRL 3 2 Round Brisk None   Left PERRL 3 2 Round Brisk None         Visual Fields       Left Right    Full Full  Extraocular Movement       Right Left    Full, Ortho Full, Ortho         Neuro/Psych     Oriented x3: Yes   Mood/Affect: Normal         Dilation     Both eyes: 2.5% Phenylephrine @ 8:32 AM           Slit Lamp and Fundus Exam     Slit Lamp Exam       Right Left   Lids/Lashes Dermatochalasis - upper lid Dermatochalasis - upper lid   Conjunctiva/Sclera nasal pingeucula Mild nasal and temporal pinguecula   Cornea arcus, well healed cataract wound, trace PEE, mild tear film debris, small sub epi scar at 0400 paracentral arcus, tear film debris, trace Punctate epithelial erosions, well healed cataract wound   Anterior Chamber deep and clear deep and clear   Iris Round and dilated Round and dilated   Lens PC IOL in good position PC IOL in good position   Anterior Vitreous Vitreous syneresis, +silicone micro bubbles Mild syeresis         Fundus Exam       Right Left   Posterior Vitreous Posterior vitreous detachment Posterior vitreous detachment   Disc Pink and Sharp, vascular loops, +hyperemia, PPP temporally Pink and Sharp, mild PPA   C/D Ratio 0.2 0.3   Macula Flat, Blunted foveal reflex, focal IRF and edema temporal fovea and mac -- slightly improved Flat, Good foveal reflex, RPE mottling, No heme or edema   Vessels attenuated, Tortuous, old CRVO attenuated, mild tortuosity, mild AV crossing changes   Periphery Attached, No heme Attached, No heme           Refraction     Wearing Rx       Sphere Cylinder Axis Add   Right +0.00 +2.25 001 +2.00   Left -0.50 +2.25 165 +2.00           IMAGING AND PROCEDURES  Imaging and Procedures for 11/22/2022  OCT, Retina - OU - Both Eyes       Right Eye Quality was good. Scan  locations included subfoveal. Central Foveal Thickness: 267. Progression has improved. Findings include normal foveal contour, no IRF, no SRF (interval improvement in IRF / cystic changes temporal macula ).   Left Eye Quality was good. Scan locations included subfoveal. Central Foveal Thickness: 280. Progression has been stable. Findings include normal foveal contour, no IRF, no SRF.   Notes *Images captured and stored on drive  Diagnosis / Impression:  OD: CRVO w/ interval improvement in IRF / cystic changes temporal macula  OS: NFP, no IRF/SRF  Clinical management:  See below  Abbreviations: NFP - Normal foveal profile. CME - cystoid macular edema. PED - pigment epithelial detachment. IRF - intraretinal fluid. SRF - subretinal fluid. EZ - ellipsoid zone. ERM - epiretinal membrane. ORA - outer retinal atrophy. ORT - outer retinal tubulation. SRHM - subretinal hyper-reflective material. IRHM - intraretinal hyper-reflective material        Intravitreal Injection, Pharmacologic Agent - OD - Right Eye       Time Out 11/22/2022. 8:59 AM. Confirmed correct patient, procedure, site, and patient consented.   Anesthesia Topical anesthesia was used. Anesthetic medications included Lidocaine 2%, Proparacaine 0.5%.   Procedure Preparation included 5% betadine to ocular surface, eyelid speculum. A (32g) needle was used.   Injection: 1.25 mg Bevacizumab 1.25mg /0.54ml   Route: Intravitreal, Site: Right Eye   NDC: [id number], Lot: [id number], Expiration  date: 12/08/2022   Post-op Post injection exam found visual acuity of at least counting fingers. The patient tolerated the procedure well. There were no complications. The patient received written and verbal post procedure care education.              ASSESSMENT/PLAN:   ICD-10-CM   1. Central retinal vein occlusion with macular edema of right eye  H34.8110 OCT, Retina - OU - Both Eyes    Intravitreal Injection, Pharmacologic Agent -  OD - Right Eye    Bevacizumab (AVASTIN) SOLN 1.25 mg    2. Essential hypertension  I10     3. Hypertensive retinopathy of both eyes  H35.033     4. Pseudophakia, both eyes  Z96.1     5. Macular puckering, right eye  H35.371     6. 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 Cystoid macular edema of right eye  H35.351      CRVO w/ CME, OD  - Dr. Luciana Axe pt here due to insurance reasons  - onset of CRVO ~8 yrs ago per pt report (~2015-2016)  - history of multiple IVA OD -- on 14 wk maintenance schedule w/ [facility]  - last injxn w [facility] -- IVA OD on 09.12.23 (18 wks prior to 1st visit/injxn here)  - s/p IVA OD #1 (01.18.24), #2 (04.11.24), #3 (05.23.24), #4 (07.18.24)  **increase in IRF at 8 wks on 07.18.24 visit**  - BCVA decreased to 20/40 from 20/25   - OCT shows interval improvement in IRF temporal macula at 7wks  - recommend IVA OD #5 today, 09.05.24 w/ f/u at 7 wks again  - pt wishes to proceed with injection  - RBA of procedure discussed, questions answered - informed consent obtained and signed  - see procedure note - Eylea approved for 2024 - f/u 7 wks -- DFE/OCT, possible injection  2,3. Hypertensive retinopathy OU - discussed importance of tight BP control - monitor  4. Pseudophakia OU  - s/p CE/IOL OU (Dr. Alben Spittle)  - IOL in good position, doing well  - monitor  Ophthalmic Meds Ordered this visit:  Meds ordered this encounter  Medications   Bevacizumab (AVASTIN) SOLN 1.25 mg     Return in about 7 weeks (around 01/10/2023) for f/u CRVO OD, DFE, OCT.  There are no Patient Instructions on file for this visit.   Explained the diagnoses, plan, and follow up with the patient and they expressed understanding.  Patient expressed understanding of the importance of proper follow up care.   This document serves as a record of services personally performed by Karie Chimera, MD, PhD. It was created on their behalf by Glee Arvin. Manson Passey, OA an ophthalmic  technician. The creation of this record is the provider's dictation and/or activities during the visit.    Electronically signed by: Glee Arvin. Manson Passey, OA 11/22/22 10:33 AM  Karie Chimera, M.D., Ph.D. Diseases & Surgery of the Retina and Vitreous [facility]  I have reviewed the above documentation for accuracy and completeness, and I agree with the above. Karie Chimera, M.D., Ph.D. 11/22/22 10:34 AM  Abbreviations: M myopia (nearsighted); A astigmatism; H hyperopia (farsighted); P presbyopia; Mrx spectacle prescription;  CTL contact lenses; OD right eye; OS left eye; OU both eyes  XT exotropia; ET esotropia; PEK punctate epithelial keratitis; PEE punctate epithelial erosions; DES dry eye syndrome; MGD meibomian gland dysfunction; ATs artificial tears; PFAT's preservative free artificial tears; NSC nuclear  sclerotic cataract; PSC posterior subcapsular cataract; ERM epi-retinal membrane; PVD posterior vitreous detachment; RD retinal detachment; DM diabetes mellitus; DR diabetic retinopathy; NPDR non-proliferative diabetic retinopathy; PDR proliferative diabetic retinopathy; CSME clinically significant macular edema; DME diabetic macular edema; dbh dot blot hemorrhages; CWS cotton wool spot; POAG primary open angle glaucoma; C/D cup-to-disc ratio; HVF humphrey visual field; GVF goldmann visual field; OCT optical coherence tomography; IOP intraocular pressure; BRVO Branch retinal vein occlusion; CRVO central retinal vein occlusion; CRAO central retinal artery occlusion; BRAO branch retinal artery occlusion; RT retinal tear; SB scleral buckle; PPV pars plana vitrectomy; VH Vitreous hemorrhage; PRP panretinal laser photocoagulation; IVK intravitreal kenalog; VMT vitreomacular traction; MH Macular hole;  NVD neovascularization of the disc; NVE neovascularization elsewhere; AREDS age related eye disease study; ARMD age related macular degeneration; POAG primary open angle glaucoma; EBMD  epithelial/anterior basement membrane dystrophy; ACIOL anterior chamber intraocular lens; IOL intraocular lens; PCIOL posterior chamber intraocular lens; Phaco/IOL phacoemulsification with intraocular lens placement; PRK photorefractive keratectomy; LASIK laser assisted in situ keratomileusis; HTN hypertension; DM diabetes mellitus; COPD chronic obstructive pulmonary disease

## 2022-11-22 ENCOUNTER — Encounter (INDEPENDENT_AMBULATORY_CARE_PROVIDER_SITE_OTHER): Payer: Self-pay | Admitting: Ophthalmology

## 2022-11-22 ENCOUNTER — Ambulatory Visit (INDEPENDENT_AMBULATORY_CARE_PROVIDER_SITE_OTHER): Payer: Medicare Other | Admitting: Ophthalmology

## 2022-11-22 DIAGNOSIS — I10 Essential (primary) hypertension: Secondary | ICD-10-CM | POA: Diagnosis not present

## 2022-11-22 DIAGNOSIS — H43822 Vitreomacular adhesion, left eye: Secondary | ICD-10-CM

## 2022-11-22 DIAGNOSIS — Z23 Encounter for immunization: Secondary | ICD-10-CM | POA: Diagnosis not present

## 2022-11-22 DIAGNOSIS — H35351 Cystoid macular degeneration, right eye: Secondary | ICD-10-CM

## 2022-11-22 DIAGNOSIS — H35371 Puckering of macula, right eye: Secondary | ICD-10-CM | POA: Diagnosis not present

## 2022-11-22 DIAGNOSIS — H34811 Central retinal vein occlusion, right eye, with macular edema: Secondary | ICD-10-CM | POA: Diagnosis not present

## 2022-11-22 DIAGNOSIS — H35033 Hypertensive retinopathy, bilateral: Secondary | ICD-10-CM | POA: Diagnosis not present

## 2022-11-22 DIAGNOSIS — Z961 Presence of intraocular lens: Secondary | ICD-10-CM

## 2022-11-22 MED ORDER — BEVACIZUMAB CHEMO INJECTION 1.25MG/0.05ML SYRINGE FOR KALEIDOSCOPE
1.2500 mg | INTRAVITREAL | Status: AC | PRN
Start: 2022-11-22 — End: 2022-11-22
  Administered 2022-11-22: 1.25 mg via INTRAVITREAL

## 2023-01-01 NOTE — Progress Notes (Signed)
[facility] - Clinic Note  01/10/2023     CHIEF COMPLAINT Patient presents for Retina Follow Up   HISTORY OF PRESENT ILLNESS: Jorge [name] is a 77 y.o. male who presents to the clinic today for:   HPI     Retina Follow Up   Patient presents with  CRVO/BRVO.  In right eye.  This started 7 weeks ago.  I, the attending physician,  performed the HPI with the patient and updated documentation appropriately.        Comments   Patient here for 7 weeks retina follow up for CRVO OD. Patient states vision can't see. Otherwise ok. No eye pain. Can't see to read. After reading a little even big letters are hard.       Last edited by Rennis Chris, MD on 01/10/2023  9:51 AM.     Pt states vision is stable  Referring physician: [name], MD [address] [place],  [place] [address]  HISTORICAL INFORMATION:   Selected notes from the MEDICAL RECORD NUMBER Pt transferring care from Dr. 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 (Ophthalmic Drugs)   No current facility-administered medications for this visit. (Ophthalmic Drugs)   Current Outpatient Medications (Other)  Medication Sig   amoxicillin (AMOXIL) 500 MG capsule Take 1 capsule by mouth every 6 (six) hours.   aspirin EC 81 MG tablet Take 81 mg by mouth daily.   atorvastatin (LIPITOR) 80 MG tablet Take 40 mg by mouth daily.    ezetimibe (ZETIA) 10 MG tablet Take 10 mg by mouth daily with breakfast.   fish oil-omega-3 fatty acids 1000 MG capsule Take 2 g by mouth daily with breakfast.    metoprolol succinate (TOPROL-XL) 25 MG 24 hr tablet TAKE 1/2 TABLET BY MOUTH EVERY DAY WITH BREAKFAST   Multiple Vitamin (MULTIVITAMIN) capsule Take 1 capsule by mouth daily.     MYRBETRIQ 50 MG TB24 tablet Take 50 mg by  mouth daily.   pantoprazole (PROTONIX) 40 MG tablet Take 1 tablet (40 mg total) by mouth daily.   Tofacitinib Citrate 5 MG TABS Take 5 mg by mouth daily.   No current facility-administered medications for this visit. (Other)   REVIEW OF SYSTEMS: ROS   Positive for: Gastrointestinal, Cardiovascular, Eyes Negative for: Constitutional, Neurological, Skin, Genitourinary, Musculoskeletal, HENT, Endocrine, Respiratory, Psychiatric, Allergic/Imm, Heme/Lymph Last edited by Laddie Aquas, COA on 01/10/2023  8:25 AM.     ALLERGIES Allergies  Allergen Reactions   Niacin Itching and Swelling   PAST MEDICAL HISTORY Past Medical History:  Diagnosis Date   Arthritis    CAD (coronary artery disease)    PTCA diagonal 2001, residual 60% LAD /  Nuclear 2006, no ischemia   Carotid artery disease (HCC)    Doppler, October, 2012, 0-39% bilateral stenoses.   Diverticulitis    Dyslipidemia    Ejection fraction    EF normal,   Eye abnormalities 08-23-2011   right ryr injection for bleed done by dr Luciana Axe, dr Luciana Axe told pt ok to have surgery 09-03-2011   Family history of anesthesia complication    SISTER HAD MEMORY PROBLEMS POST OP   Nuclear sclerotic cataract of left eye 09/23/2019   The nature of cataract was discussed with the patient as well as the  elective nature of surgery. The patient was reassured that surgery at a later date does not put the patient at risk for a worse outcome. It was emphasized that the need for surgery is dictated by the patient's quality of life as influenced by the cataract. Patient was instructed to maintain close follow up with their general eye    Preop cardiovascular exam    Patient needs cardiac clearance for hernia surgery May, 2013   Psoriatic arthritis (HCC)    2012   PVC's (premature ventricular contractions)    Frequent PVCs noted over the years., These always decrease with exercise. This is documented   Umbilical hernia 07/31/2011   Past Surgical History:   Procedure Laterality Date   ABLATION  06/09/2019   ANGIOPLASTY  12/1999 and 03/2000   CARDIAC CATHETERIZATION  2001   carpel tunnel  2009/2010   both wrists   COLONOSCOPY N/A 10/30/2013   Procedure: COLONOSCOPY;  Surgeon: Theda Belfast, MD;  Location: [facility] ENDOSCOPY;  Service: Endoscopy;  Laterality: N/A;   ESOPHAGOGASTRODUODENOSCOPY N/A 10/30/2013   Procedure: ESOPHAGOGASTRODUODENOSCOPY (EGD);  Surgeon: Theda Belfast, MD;  Location: [facility] ENDOSCOPY;  Service: Endoscopy;  Laterality: N/A;   INGUINAL HERNIA REPAIR  09/03/2011   Procedure: HERNIA REPAIR INGUINAL ADULT;  Surgeon: [name], MD;  Location: [facility] ORS;  Service: General;  Laterality: Left;  Left Ingunal Hernia Repair with Mesh   PVC ABLATION N/A 06/09/2019   Procedure: PVC ABLATION;  Surgeon: Marinus Maw, MD;  Location: [facility] INVASIVE CV LAB;  Service: Cardiovascular;  Laterality: N/A;   right knee meniscus repair  1990's   UMBILICAL HERNIA REPAIR  09/03/2011   Procedure: HERNIA REPAIR UMBILICAL ADULT;  Surgeon: Wilmon Arms. Tsuei, MD;  Location: [facility] ORS;  Service: General;  Laterality: N/A;   FAMILY HISTORY Family History  Problem Relation Age of Onset   Cancer Mother        uterine   Cancer Sister        Breast   Crohn's disease Cousin        distant   Colon cancer Neg Hx    Ulcerative colitis Neg Hx    SOCIAL HISTORY Social History   Tobacco Use   Smoking status: Former    Current packs/day: 0.00    Average packs/day: 1.5 packs/day for 25.0 years (37.5 ttl pk-yrs)    Types: Cigarettes    Start date: 12/07/1969    Quit date: 12/08/1994    Years since quitting: 28.1   Smokeless tobacco: Former  Building services engineer status: Never Used  Substance Use Topics   Alcohol use: Yes    Comment: 2 drink per day   Drug use: No       OPHTHALMIC EXAM:  Base Eye Exam     Visual Acuity (Snellen - Linear)       Right Left   Dist cc 20/40 20/20   Dist ph cc 20/30 -1     Correction: Glasses         Tonometry  (Tonopen, 8:22 AM)       Right Left   Pressure 18 14         Pupils       Dark Light Shape React APD   Right 3 2 Round Brisk None   Left 3 2 Round Brisk None         Visual Fields       Left Right    Full Full  Extraocular Movement       Right Left    Full, Ortho Full, Ortho         Neuro/Psych     Oriented x3: Yes   Mood/Affect: Normal         Dilation     Both eyes: 1.0% Mydriacyl, 2.5% Phenylephrine @ 8:22 AM           Slit Lamp and Fundus Exam     Slit Lamp Exam       Right Left   Lids/Lashes Dermatochalasis - upper lid Dermatochalasis - upper lid   Conjunctiva/Sclera nasal pingeucula Mild nasal and temporal pinguecula   Cornea arcus, well healed cataract wound, trace PEE, mild tear film debris, small sub epi scar at 0400 paracentral arcus, tear film debris, trace Punctate epithelial erosions, well healed cataract wound   Anterior Chamber deep and clear deep and clear   Iris Round and dilated Round and dilated   Lens PC IOL in good position PC IOL in good position   Anterior Vitreous Vitreous syneresis, +silicone micro bubbles Mild syeresis         Fundus Exam       Right Left   Posterior Vitreous Posterior vitreous detachment, vitreous condensations Posterior vitreous detachment   Disc Pink and Sharp, vascular loops, patchy hyperemia, PPP temporally Pink and Sharp, mild PPA   C/D Ratio 0.2 0.3   Macula Flat, Blunted foveal reflex, focal IRF and edema temporal fovea and mac -- slightly improved Flat, Good foveal reflex, RPE mottling, No heme or edema   Vessels attenuated, Tortuous, old CRVO attenuated, mild tortuosity, mild AV crossing changes   Periphery Attached, No heme Attached, No heme           Refraction     Wearing Rx       Sphere Cylinder Axis Add   Right +0.00 +2.25 001 +2.00   Left -0.50 +2.25 165 +2.00           IMAGING AND PROCEDURES  Imaging and Procedures for 01/10/2023  OCT, Retina - OU - Both  Eyes       Right Eye Quality was poor. Scan locations included subfoveal. Central Foveal Thickness: 267. Progression has improved. Findings include normal foveal contour, no IRF, no SRF (interval improvement in scattered IRF / cystic changes ).   Left Eye Quality was good. Scan locations included subfoveal. Central Foveal Thickness: 281. Progression has been stable. Findings include normal foveal contour, no IRF, no SRF.   Notes *Images captured and stored on drive  Diagnosis / Impression:  OD: CRVO - interval improvement in scattered IRF / cystic changes  OS: NFP, no IRF/SRF  Clinical management:  See below  Abbreviations: NFP - Normal foveal profile. CME - cystoid macular edema. PED - pigment epithelial detachment. IRF - intraretinal fluid. SRF - subretinal fluid. EZ - ellipsoid zone. ERM - epiretinal membrane. ORA - outer retinal atrophy. ORT - outer retinal tubulation. SRHM - subretinal hyper-reflective material. IRHM - intraretinal hyper-reflective material        Intravitreal Injection, Pharmacologic Agent - OD - Right Eye       Time Out 01/10/2023. 9:00 AM. Confirmed correct patient, procedure, site, and patient consented.   Anesthesia Topical anesthesia was used. Anesthetic medications included Lidocaine 2%, Proparacaine 0.5%.   Procedure Preparation included 5% betadine to ocular surface, eyelid speculum. A supplied (32g) needle was used.   Injection: 1.25 mg Bevacizumab 1.25mg /0.44ml   Route: Intravitreal, Site: Right Eye   NDC:  84166-063-01, Lot: [id number], Expiration date: 02/25/2023   Post-op Post injection exam found visual acuity of at least counting fingers. The patient tolerated the procedure well. There were no complications. The patient received written and verbal post procedure care education.            ASSESSMENT/PLAN:   ICD-10-CM   1. Central retinal vein occlusion with macular edema of right eye  H34.8110 OCT, Retina - OU - Both Eyes     Intravitreal Injection, Pharmacologic Agent - OD - Right Eye    Bevacizumab (AVASTIN) SOLN 1.25 mg    2. Essential hypertension  I10     3. Hypertensive retinopathy of both eyes  H35.033     4. Pseudophakia, both eyes  Z96.1      CRVO w/ CME, OD  - Dr. Luciana Axe pt here due to insurance reasons  - onset of CRVO ~8 yrs ago per pt report (~2015-2016)  - history of multiple IVA OD -- on 14 wk maintenance schedule w/ [facility]  - last injxn w [facility] -- IVA OD on 09.12.23 (18 wks prior to 1st visit/injxn here)  - s/p IVA OD #1 (01.18.24), #2 (04.11.24), #3 (05.23.24), #4 (07.18.24), #5 (09.05.24)  **increase in IRF at 8 wks on 07.18.24 visit**  - BCVA OD 20/30 from 20/40   - OCT shows interval improvement in scattered IRF at 7wks  - recommend IVA OD #6 today, 10.24.24 w/ f/u at 7 wks again  - pt wishes to proceed with injection  - RBA of procedure discussed, questions answered - informed consent obtained and signed  - see procedure note - Eylea approved for 2024 - f/u 7 wks -- DFE/OCT, possible injection  2,3. Hypertensive retinopathy OU - discussed importance of tight BP control - monitor  4. Pseudophakia OU  - s/p CE/IOL OU (Dr. Alben Spittle)  - IOL in good position, doing well  - monitor  Ophthalmic Meds Ordered this visit:  Meds ordered this encounter  Medications   Bevacizumab (AVASTIN) SOLN 1.25 mg     Return in about 7 weeks (around 02/28/2023) for f/u CRVO OD, DFE, OCT.  There are no Patient Instructions on file for this visit.   Explained the diagnoses, plan, and follow up with the patient and they expressed understanding.  Patient expressed understanding of the importance of proper follow up care.   This document serves as a record of services personally performed by Karie Chimera, MD, PhD. It was created on their behalf by Glee Arvin. Manson Passey, OA an ophthalmic technician. The creation of this record is the provider's dictation and/or activities during the visit.     Electronically signed by: Glee Arvin. Manson Passey, OA 01/10/23 11:58 AM  Karie Chimera, M.D., Ph.D. Diseases & Surgery of the Retina and Vitreous [facility]  I have reviewed the above documentation for accuracy and completeness, and I agree with the above. Karie Chimera, M.D., Ph.D. 01/10/23 12:00 PM   Abbreviations: M myopia (nearsighted); A astigmatism; H hyperopia (farsighted); P presbyopia; Mrx spectacle prescription;  CTL contact lenses; OD right eye; OS left eye; OU both eyes  XT exotropia; ET esotropia; PEK punctate epithelial keratitis; PEE punctate epithelial erosions; DES dry eye syndrome; MGD meibomian gland dysfunction; ATs artificial tears; PFAT's preservative free artificial tears; NSC nuclear sclerotic cataract; PSC posterior subcapsular cataract; ERM epi-retinal membrane; PVD posterior vitreous detachment; RD retinal detachment; DM diabetes mellitus; DR diabetic retinopathy; NPDR non-proliferative diabetic retinopathy; PDR proliferative diabetic retinopathy; CSME clinically significant  macular edema; DME diabetic macular edema; dbh dot blot hemorrhages; CWS cotton wool spot; POAG primary open angle glaucoma; C/D cup-to-disc ratio; HVF humphrey visual field; GVF goldmann visual field; OCT optical coherence tomography; IOP intraocular pressure; BRVO Branch retinal vein occlusion; CRVO central retinal vein occlusion; CRAO central retinal artery occlusion; BRAO branch retinal artery occlusion; RT retinal tear; SB scleral buckle; PPV pars plana vitrectomy; VH Vitreous hemorrhage; PRP panretinal laser photocoagulation; IVK intravitreal kenalog; VMT vitreomacular traction; MH Macular hole;  NVD neovascularization of the disc; NVE neovascularization elsewhere; AREDS age related eye disease study; ARMD age related macular degeneration; POAG primary open angle glaucoma; EBMD epithelial/anterior basement membrane dystrophy; ACIOL anterior chamber intraocular lens; IOL  intraocular lens; PCIOL posterior chamber intraocular lens; Phaco/IOL phacoemulsification with intraocular lens placement; PRK photorefractive keratectomy; LASIK laser assisted in situ keratomileusis; HTN hypertension; DM diabetes mellitus; COPD chronic obstructive pulmonary disease

## 2023-01-10 ENCOUNTER — Ambulatory Visit (INDEPENDENT_AMBULATORY_CARE_PROVIDER_SITE_OTHER): Payer: Medicare Other | Admitting: Ophthalmology

## 2023-01-10 ENCOUNTER — Encounter (INDEPENDENT_AMBULATORY_CARE_PROVIDER_SITE_OTHER): Payer: Self-pay | Admitting: Ophthalmology

## 2023-01-10 DIAGNOSIS — H35033 Hypertensive retinopathy, bilateral: Secondary | ICD-10-CM | POA: Diagnosis not present

## 2023-01-10 DIAGNOSIS — Z961 Presence of intraocular lens: Secondary | ICD-10-CM | POA: Diagnosis not present

## 2023-01-10 DIAGNOSIS — H34811 Central retinal vein occlusion, right eye, with macular edema: Secondary | ICD-10-CM

## 2023-01-10 DIAGNOSIS — I10 Essential (primary) hypertension: Secondary | ICD-10-CM | POA: Diagnosis not present

## 2023-01-10 MED ORDER — BEVACIZUMAB CHEMO INJECTION 1.25MG/0.05ML SYRINGE FOR KALEIDOSCOPE
1.2500 mg | INTRAVITREAL | Status: AC | PRN
Start: 2023-01-10 — End: 2023-01-10
  Administered 2023-01-10: 1.25 mg via INTRAVITREAL

## 2023-01-18 DIAGNOSIS — H35033 Hypertensive retinopathy, bilateral: Secondary | ICD-10-CM | POA: Diagnosis not present

## 2023-01-18 DIAGNOSIS — H04123 Dry eye syndrome of bilateral lacrimal glands: Secondary | ICD-10-CM | POA: Diagnosis not present

## 2023-01-18 DIAGNOSIS — H34811 Central retinal vein occlusion, right eye, with macular edema: Secondary | ICD-10-CM | POA: Diagnosis not present

## 2023-01-18 DIAGNOSIS — H40013 Open angle with borderline findings, low risk, bilateral: Secondary | ICD-10-CM | POA: Diagnosis not present

## 2023-01-18 DIAGNOSIS — H524 Presbyopia: Secondary | ICD-10-CM | POA: Diagnosis not present

## 2023-02-26 NOTE — Progress Notes (Signed)
[facility] - Clinic Note  02/28/2023     CHIEF COMPLAINT Patient presents for Retina Follow Up   HISTORY OF PRESENT ILLNESS: Jorge [name] is a 77 y.o. male who presents to the clinic today for:   HPI     Retina Follow Up   Patient presents with  CRVO/BRVO.  In right eye.  This started 7 weeks ago.  I, the attending physician,  performed the HPI with the patient and updated documentation appropriately.        Comments   Patient feels the vision is the same, he states he just can't see. He is using M[employer] OU QID.       Last edited by Jorge Chris, MD on 02/28/2023 10:57 AM.     Patient states the vision is the same.    Referring physician: [name], MD [address] [place],  [place] [address]  HISTORICAL INFORMATION:   Selected notes from the MEDICAL RECORD NUMBER Pt transferring care from Dr. 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 (Ophthalmic Drugs)   No current facility-administered medications for this visit. (Ophthalmic Drugs)   Current Outpatient Medications (Other)  Medication Sig   amoxicillin (AMOXIL) 500 MG capsule Take 1 capsule by mouth every 6 (six) hours.   aspirin EC 81 MG tablet Take 81 mg by mouth daily.   atorvastatin (LIPITOR) 80 MG tablet Take 40 mg by mouth daily.    ezetimibe (ZETIA) 10 MG tablet Take 10 mg by mouth daily with breakfast.   fish oil-omega-3 fatty acids 1000 MG capsule Take 2 g by mouth daily with breakfast.    metoprolol succinate (TOPROL-XL) 25 MG 24 hr tablet TAKE 1/2 TABLET BY MOUTH EVERY DAY WITH BREAKFAST   Multiple Vitamin (MULTIVITAMIN) capsule Take 1 capsule by mouth daily.     MYRBETRIQ 50 MG TB24 tablet Take 50 mg by mouth daily.   pantoprazole (PROTONIX) 40 MG tablet Take 1 tablet (40 mg total)  by mouth daily.   Tofacitinib Citrate 5 MG TABS Take 5 mg by mouth daily.   No current facility-administered medications for this visit. (Other)   REVIEW OF SYSTEMS: ROS   Positive for: Gastrointestinal, Cardiovascular, Eyes Negative for: Constitutional, Neurological, Skin, Genitourinary, Musculoskeletal, HENT, Endocrine, Respiratory, Psychiatric, Allergic/Imm, Heme/Lymph Last edited by Jorge [name], COT on 02/28/2023  8:27 AM.      ALLERGIES Allergies  Allergen Reactions   Niacin Itching and Swelling   PAST MEDICAL HISTORY Past Medical History:  Diagnosis Date   Arthritis    CAD (coronary artery disease)    PTCA diagonal 2001, residual 60% LAD /  Nuclear 2006, no ischemia   Carotid artery disease (HCC)    Doppler, October, 2012, 0-39% bilateral stenoses.   Diverticulitis    Dyslipidemia    Ejection fraction    EF normal,   Eye abnormalities 08-23-2011   right ryr injection for bleed done by dr Jorge [name], dr Jorge [name] told pt ok to have surgery 09-03-2011   Family history of anesthesia complication    SISTER HAD MEMORY PROBLEMS POST OP   Nuclear sclerotic cataract of left eye 09/23/2019   The nature of cataract was discussed with the patient as well as the elective nature of surgery. The patient was reassured that surgery at  a later date does not put the patient at risk for a worse outcome. It was emphasized that the need for surgery is dictated by the patient's quality of life as influenced by the cataract. Patient was instructed to maintain close follow up with their general eye    Preop cardiovascular exam    Patient needs cardiac clearance for hernia surgery May, 2013   Psoriatic arthritis (HCC)    2012   PVC's (premature ventricular contractions)    Frequent PVCs noted over the years., These always decrease with exercise. This is documented   Umbilical hernia 07/31/2011   Past Surgical History:  Procedure Laterality Date   ABLATION  06/09/2019   ANGIOPLASTY  12/1999 and  03/2000   CARDIAC CATHETERIZATION  2001   carpel tunnel  2009/2010   both wrists   COLONOSCOPY N/A 10/30/2013   Procedure: COLONOSCOPY;  Surgeon: Theda Belfast, MD;  Location: [facility] ENDOSCOPY;  Service: Endoscopy;  Laterality: N/A;   ESOPHAGOGASTRODUODENOSCOPY N/A 10/30/2013   Procedure: ESOPHAGOGASTRODUODENOSCOPY (EGD);  Surgeon: Theda Belfast, MD;  Location: [facility] ENDOSCOPY;  Service: Endoscopy;  Laterality: N/A;   INGUINAL HERNIA REPAIR  09/03/2011   Procedure: HERNIA REPAIR INGUINAL ADULT;  Surgeon: [name], MD;  Location: [facility] ORS;  Service: General;  Laterality: Left;  Left Ingunal Hernia Repair with Mesh   PVC ABLATION N/A 06/09/2019   Procedure: PVC ABLATION;  Surgeon: Marinus Maw, MD;  Location: [facility] INVASIVE CV LAB;  Service: Cardiovascular;  Laterality: N/A;   right knee meniscus repair  1990's   UMBILICAL HERNIA REPAIR  09/03/2011   Procedure: HERNIA REPAIR UMBILICAL ADULT;  Surgeon: Wilmon Arms. Tsuei, MD;  Location: [facility] ORS;  Service: General;  Laterality: N/A;   FAMILY HISTORY Family History  Problem Relation Age of Onset   Cancer Mother        uterine   Cancer Sister        Breast   Crohn's disease Cousin        distant   Colon cancer Neg Hx    Ulcerative colitis Neg Hx    SOCIAL HISTORY Social History   Tobacco Use   Smoking status: Former    Current packs/day: 0.00    Average packs/day: 1.5 packs/day for 25.0 years (37.5 ttl pk-yrs)    Types: Cigarettes    Start date: 12/07/1969    Quit date: 12/08/1994    Years since quitting: 28.2   Smokeless tobacco: Former  Building services engineer status: Never Used  Substance Use Topics   Alcohol use: Yes    Comment: 2 drink per day   Drug use: No       OPHTHALMIC EXAM:  Base Eye Exam     Visual Acuity (Snellen - Linear)       Right Left   Dist cc 20/40 20/20   Dist ph cc 20/30          Tonometry (Tonopen, 8:31 AM)       Right Left   Pressure 22 19         Pupils       Dark Light Shape React APD    Right 3 2 Round Brisk None   Left 3 2 Round Brisk None         Visual Fields       Left Right    Full          Extraocular Movement       Right Left  Full, Ortho Full, Ortho         Neuro/Psych     Oriented x3: Yes   Mood/Affect: Normal         Dilation     Both eyes: 2.5% Phenylephrine @ 8:29 AM           Slit Lamp and Fundus Exam     Slit Lamp Exam       Right Left   Lids/Lashes Dermatochalasis - upper lid Dermatochalasis - upper lid   Conjunctiva/Sclera nasal pingeucula Mild nasal and temporal pinguecula   Cornea arcus, well healed cataract wound, trace PEE, mild tear film debris, small sub epi scar at 0400 paracentral arcus, tear film debris, trace Punctate epithelial erosions, well healed cataract wound   Anterior Chamber deep and clear deep and clear   Iris Round and dilated Round and dilated   Lens PC IOL in good position PC IOL in good position   Anterior Vitreous Vitreous syneresis, +silicone micro bubbles Mild syeresis         Fundus Exam       Right Left   Posterior Vitreous Posterior vitreous detachment, vitreous condensations Posterior vitreous detachment   Disc Pink and Sharp, vascular loops, patchy hyperemia, PPP temporally Pink and Sharp, mild PPA   C/D Ratio 0.2 0.3   Macula Flat, Blunted foveal reflex, focal IRF and edema temporal fovea and mac -- slightly improved Flat, Good foveal reflex, RPE mottling, No heme or edema   Vessels attenuated, Tortuous, old CRVO attenuated, mild tortuosity, mild AV crossing changes   Periphery Attached, No heme Attached, No heme           Refraction     Wearing Rx       Sphere Cylinder Axis Add   Right +0.00 +2.25 001 +2.00   Left -0.50 +2.25 165 +2.00           IMAGING AND PROCEDURES  Imaging and Procedures for 02/28/2023  OCT, Retina - OU - Both Eyes       Right Eye Quality was poor. Scan locations included subfoveal. Central Foveal Thickness: 259. Progression has improved.  Findings include normal foveal contour, no IRF, no SRF (interval improvement in scattered IRF / cystic changes -- just trace cystic changes remain).   Left Eye Quality was good. Scan locations included subfoveal. Central Foveal Thickness: 280. Progression has been stable. Findings include normal foveal contour, no IRF, no SRF.   Notes *Images captured and stored on drive  Diagnosis / Impression:  OD: CRVO - interval improvement in scattered IRF / cystic changes -- just trace cystic changes remain OS: NFP, no IRF/SRF  Clinical management:  See below  Abbreviations: NFP - Normal foveal profile. CME - cystoid macular edema. PED - pigment epithelial detachment. IRF - intraretinal fluid. SRF - subretinal fluid. EZ - ellipsoid zone. ERM - epiretinal membrane. ORA - outer retinal atrophy. ORT - outer retinal tubulation. SRHM - subretinal hyper-reflective material. IRHM - intraretinal hyper-reflective material        Intravitreal Injection, Pharmacologic Agent - OD - Right Eye       Time Out 02/28/2023. 9:13 AM. Confirmed correct patient, procedure, site, and patient consented.   Anesthesia Topical anesthesia was used. Anesthetic medications included Lidocaine 2%, Proparacaine 0.5%.   Procedure Preparation included 5% betadine to ocular surface, eyelid speculum. A (32g) needle was used.   Injection: 1.25 mg Bevacizumab 1.25mg /0.34ml   Route: Intravitreal, Site: Right Eye   NDC: [id number], Lot: [id number], Expiration date: 05/27/2023  Post-op Post injection exam found visual acuity of at least counting fingers. The patient tolerated the procedure well. There were no complications. The patient received written and verbal post procedure care education.             ASSESSMENT/PLAN:   ICD-10-CM   1. Central retinal vein occlusion with macular edema of right eye  H34.8110 OCT, Retina - OU - Both Eyes    Intravitreal Injection, Pharmacologic Agent - OD - Right Eye    Bevacizumab  (AVASTIN) SOLN 1.25 mg    2. Essential hypertension  I10     3. Hypertensive retinopathy of both eyes  H35.033     4. Pseudophakia, both eyes  Z96.1      CRVO w/ CME, OD  - Dr. Luciana [name] pt here due to insurance reasons  - onset of CRVO ~8 yrs ago per pt report (~2015-2016)  - history of multiple IVA OD -- on 14 wk maintenance schedule w/ [facility]  - last injxn w [facility] -- IVA OD on 09.12.23 (18 wks prior to 1st visit/injxn here) - s/p IVA OD #1 (01.18.24), #2 (04.11.24), #3 (05.23.24), #4 (07.18.24), #5 (09.05.24), #6 (10.24.24)  **increase in IRF at 8 wks on 07.18.24 visit**  - BCVA OD 20/30 from 20/40  - OCT shows interval improvement in scattered IRF / cystic changes -- just trace cystic changes remain at 7wks  - recommend IVA OD #7 today, 12.12.24 w/ f/u ext to 8 wks  - pt wishes to proceed with injection  - RBA of procedure discussed, questions answered - informed consent obtained and signed 01.18.24  - see procedure note - [facility] approved for 2024 - f/u 8 wks -- DFE/OCT, possible injection  2,3. Hypertensive retinopathy OU - discussed importance of tight BP control - monitor  4. Pseudophakia OU  - s/p CE/IOL OU (Dr. Alben Spittle)  - IOL in good position, doing well  - monitor  Ophthalmic Meds Ordered this visit:  Meds ordered this encounter  Medications   Bevacizumab (AVASTIN) SOLN 1.25 mg     Return in about 8 weeks (around 04/25/2023) for f/u CRVO OD, DFE, OCT, Possible, IVA, OD.  There are no Patient Instructions on file for this visit.   Explained the diagnoses, plan, and follow up with the patient and they expressed understanding.  Patient expressed understanding of the importance of proper follow up care.   This document serves as a record of services personally performed by Karie Chimera, MD, PhD. It was created on their behalf by Annalee Genta, COMT. The creation of this record is the provider's dictation and/or activities during the visit.  Electronically signed  by: Annalee Genta, COMT 03/01/23 1:44 AM  This document serves as a record of services personally performed by Karie Chimera, MD, PhD. It was created on their behalf by Jorge [name], COT an ophthalmic technician. The creation of this record is the provider's dictation and/or activities during the visit.    Electronically signed by:  Jorge [name], COT  03/01/23 1:44 AM  Karie Chimera, M.D., Ph.D. Diseases & Surgery of the Retina and Vitreous [facility]  I have reviewed the above documentation for accuracy and completeness, and I agree with the above. Karie Chimera, M.D., Ph.D. 03/01/23 1:46 AM  Abbreviations: M myopia (nearsighted); A astigmatism; H hyperopia (farsighted); P presbyopia; Mrx spectacle prescription;  CTL contact lenses; OD right eye; OS left eye; OU both eyes  XT exotropia; ET esotropia; PEK  punctate epithelial keratitis; PEE punctate epithelial erosions; DES dry eye syndrome; MGD meibomian gland dysfunction; ATs artificial tears; PFAT's preservative free artificial tears; NSC nuclear sclerotic cataract; PSC posterior subcapsular cataract; ERM epi-retinal membrane; PVD posterior vitreous detachment; RD retinal detachment; DM diabetes mellitus; DR diabetic retinopathy; NPDR non-proliferative diabetic retinopathy; PDR proliferative diabetic retinopathy; CSME clinically significant macular edema; DME diabetic macular edema; dbh dot blot hemorrhages; CWS cotton wool spot; POAG primary open angle glaucoma; C/D cup-to-disc ratio; HVF humphrey visual field; GVF goldmann visual field; OCT optical coherence tomography; IOP intraocular pressure; BRVO Branch retinal vein occlusion; CRVO central retinal vein occlusion; CRAO central retinal artery occlusion; BRAO branch retinal artery occlusion; RT retinal tear; SB scleral buckle; PPV pars plana vitrectomy; VH Vitreous hemorrhage; PRP panretinal laser photocoagulation; IVK intravitreal kenalog; VMT  vitreomacular traction; MH Macular hole;  NVD neovascularization of the disc; NVE neovascularization elsewhere; AREDS age related eye disease study; ARMD age related macular degeneration; POAG primary open angle glaucoma; EBMD epithelial/anterior basement membrane dystrophy; ACIOL anterior chamber intraocular lens; IOL intraocular lens; PCIOL posterior chamber intraocular lens; Phaco/IOL phacoemulsification with intraocular lens placement; PRK photorefractive keratectomy; LASIK laser assisted in situ keratomileusis; HTN hypertension; DM diabetes mellitus; COPD chronic obstructive pulmonary disease

## 2023-02-28 ENCOUNTER — Ambulatory Visit (INDEPENDENT_AMBULATORY_CARE_PROVIDER_SITE_OTHER): Payer: Medicare Other | Admitting: Ophthalmology

## 2023-02-28 ENCOUNTER — Encounter (INDEPENDENT_AMBULATORY_CARE_PROVIDER_SITE_OTHER): Payer: Self-pay | Admitting: Ophthalmology

## 2023-02-28 DIAGNOSIS — H34811 Central retinal vein occlusion, right eye, with macular edema: Secondary | ICD-10-CM | POA: Diagnosis not present

## 2023-02-28 DIAGNOSIS — H35033 Hypertensive retinopathy, bilateral: Secondary | ICD-10-CM

## 2023-02-28 DIAGNOSIS — Z961 Presence of intraocular lens: Secondary | ICD-10-CM

## 2023-02-28 DIAGNOSIS — I10 Essential (primary) hypertension: Secondary | ICD-10-CM | POA: Diagnosis not present

## 2023-02-28 MED ORDER — BEVACIZUMAB CHEMO INJECTION 1.25MG/0.05ML SYRINGE FOR KALEIDOSCOPE
1.2500 mg | INTRAVITREAL | Status: AC | PRN
Start: 2023-02-28 — End: 2023-02-28
  Administered 2023-02-28: 1.25 mg via INTRAVITREAL

## 2023-03-27 DIAGNOSIS — K080 Exfoliation of teeth due to systemic causes: Secondary | ICD-10-CM | POA: Diagnosis not present

## 2023-03-29 DIAGNOSIS — Z125 Encounter for screening for malignant neoplasm of prostate: Secondary | ICD-10-CM | POA: Diagnosis not present

## 2023-03-29 DIAGNOSIS — R7309 Other abnormal glucose: Secondary | ICD-10-CM | POA: Diagnosis not present

## 2023-03-29 DIAGNOSIS — I10 Essential (primary) hypertension: Secondary | ICD-10-CM | POA: Diagnosis not present

## 2023-04-03 DIAGNOSIS — I779 Disorder of arteries and arterioles, unspecified: Secondary | ICD-10-CM | POA: Diagnosis not present

## 2023-04-03 DIAGNOSIS — E785 Hyperlipidemia, unspecified: Secondary | ICD-10-CM | POA: Diagnosis not present

## 2023-04-03 DIAGNOSIS — I10 Essential (primary) hypertension: Secondary | ICD-10-CM | POA: Diagnosis not present

## 2023-04-03 DIAGNOSIS — I2510 Atherosclerotic heart disease of native coronary artery without angina pectoris: Secondary | ICD-10-CM | POA: Diagnosis not present

## 2023-04-03 DIAGNOSIS — Z0000 Encounter for general adult medical examination without abnormal findings: Secondary | ICD-10-CM | POA: Diagnosis not present

## 2023-04-23 NOTE — Progress Notes (Shared)
[facility] - Clinic Note  04/25/2023     CHIEF COMPLAINT Patient presents for No chief complaint on file.   HISTORY OF PRESENT ILLNESS: Jorge [name] is a 78 y.o. male who presents to the clinic today for:     Patient states the vision is the same.    Referring physician: [name], MD [address] [place],  [place] [address]  HISTORICAL INFORMATION:   Selected notes from the MEDICAL RECORD NUMBER Pt transferring care from Dr. 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 (Ophthalmic Drugs)   No current facility-administered medications for this visit. (Ophthalmic Drugs)   Current Outpatient Medications (Other)  Medication Sig   amoxicillin (AMOXIL) 500 MG capsule Take 1 capsule by mouth every 6 (six) hours.   aspirin EC 81 MG tablet Take 81 mg by mouth daily.   atorvastatin (LIPITOR) 80 MG tablet Take 40 mg by mouth daily.    ezetimibe (ZETIA) 10 MG tablet Take 10 mg by mouth daily with breakfast.   fish oil-omega-3 fatty acids 1000 MG capsule Take 2 g by mouth daily with breakfast.    metoprolol succinate (TOPROL-XL) 25 MG 24 hr tablet TAKE 1/2 TABLET BY MOUTH EVERY DAY WITH BREAKFAST   Multiple Vitamin (MULTIVITAMIN) capsule Take 1 capsule by mouth daily.     MYRBETRIQ 50 MG TB24 tablet Take 50 mg by mouth daily.   pantoprazole (PROTONIX) 40 MG tablet Take 1 tablet (40 mg total) by mouth daily.   Tofacitinib Citrate 5 MG TABS Take 5 mg by mouth daily.   No current facility-administered medications for this visit. (Other)   REVIEW OF SYSTEMS:    ALLERGIES Allergies  Allergen Reactions   Niacin Itching and Swelling   PAST MEDICAL HISTORY Past Medical History:  Diagnosis Date   Arthritis    CAD (coronary artery disease)    PTCA diagonal 2001,  residual 60% LAD /  Nuclear 2006, no ischemia   Carotid artery disease (HCC)    Doppler, October, 2012, 0-39% bilateral stenoses.   Diverticulitis    Dyslipidemia    Ejection fraction    EF normal,   Eye abnormalities 08-23-2011   right ryr injection for bleed done by dr Luciana Axe, dr Luciana Axe told pt ok to have surgery 09-03-2011   Family history of anesthesia complication    SISTER HAD MEMORY PROBLEMS POST OP   Nuclear sclerotic cataract of left eye 09/23/2019   The nature of cataract was discussed with the patient as well as the elective nature of surgery. The patient was reassured that surgery at a later date does not put the patient at risk for a worse outcome. It was emphasized that the need for surgery is dictated by the patient's quality of life as influenced by the cataract. Patient was instructed to maintain close follow up with their general eye    Preop cardiovascular exam    Patient needs cardiac clearance for hernia surgery May, 2013   Psoriatic arthritis (HCC)    2012   PVC's (premature ventricular contractions)    Frequent PVCs noted over the years., These always decrease with exercise. This is documented   Umbilical hernia 07/31/2011   Past Surgical History:  Procedure Laterality Date   ABLATION  06/09/2019   ANGIOPLASTY  12/1999  and 03/2000   CARDIAC CATHETERIZATION  2001   carpel tunnel  2009/2010   both wrists   COLONOSCOPY N/A 10/30/2013   Procedure: COLONOSCOPY;  Surgeon: Theda Belfast, MD;  Location: [facility] ENDOSCOPY;  Service: Endoscopy;  Laterality: N/A;   ESOPHAGOGASTRODUODENOSCOPY N/A 10/30/2013   Procedure: ESOPHAGOGASTRODUODENOSCOPY (EGD);  Surgeon: Theda Belfast, MD;  Location: [facility] ENDOSCOPY;  Service: Endoscopy;  Laterality: N/A;   INGUINAL HERNIA REPAIR  09/03/2011   Procedure: HERNIA REPAIR INGUINAL ADULT;  Surgeon: [name], MD;  Location: [facility] ORS;  Service: General;  Laterality: Left;  Left Ingunal Hernia Repair with Mesh   PVC ABLATION N/A 06/09/2019    Procedure: PVC ABLATION;  Surgeon: Marinus Maw, MD;  Location: [facility] INVASIVE CV LAB;  Service: Cardiovascular;  Laterality: N/A;   right knee meniscus repair  1990's   UMBILICAL HERNIA REPAIR  09/03/2011   Procedure: HERNIA REPAIR UMBILICAL ADULT;  Surgeon: [name], MD;  Location: [facility] ORS;  Service: General;  Laterality: N/A;   FAMILY HISTORY Family History  Problem Relation Age of Onset   Cancer Mother        uterine   Cancer Sister        Breast   Crohn's disease Cousin        distant   Colon cancer Neg Hx    Ulcerative colitis Neg Hx    SOCIAL HISTORY Social History   Tobacco Use   Smoking status: Former    Current packs/day: 0.00    Average packs/day: 1.5 packs/day for 25.0 years (37.5 ttl pk-yrs)    Types: Cigarettes    Start date: 12/07/1969    Quit date: 12/08/1994    Years since quitting: 28.3   Smokeless tobacco: Former  Building services engineer status: Never Used  Substance Use Topics   Alcohol use: Yes    Comment: 2 drink per day   Drug use: No       OPHTHALMIC EXAM:  Not recorded    IMAGING AND PROCEDURES  Imaging and Procedures for 04/25/2023           ASSESSMENT/PLAN:   ICD-10-CM   1. Central retinal vein occlusion with macular edema of right eye  H34.8110     2. Essential hypertension  I10     3. Hypertensive retinopathy of both eyes  H35.033     4. Pseudophakia, both eyes  Z96.1       CRVO w/ CME, OD  - Dr. Luciana Axe pt here due to insurance reasons  - onset of CRVO ~8 yrs ago per pt report (~2015-2016)  - history of multiple IVA OD -- on 14 wk maintenance schedule w/ [facility]  - last injxn w [facility] -- IVA OD on 09.12.23 (18 wks prior to 1st visit/injxn here) - s/p IVA OD #1 (01.18.24), #2 (04.11.24), #3 (05.23.24), #4 (07.18.24), #5 (09.05.24), #6 (10.24.24), #7 (12.12.24)  **increase in IRF at 8 wks on 07.18.24 visit**  - BCVA OD 20/30 from 20/40  - OCT shows interval improvement in scattered IRF / cystic changes -- just trace  cystic changes remain at 7wks  - recommend IVA OD #8 today, 02.06.25 w/ f/u ext to 8 wks  - pt wishes to proceed with injection  - RBA of procedure discussed, questions answered - informed consent obtained and signed 01.18.24  - see procedure note - [facility] approved for 2024 - f/u 8 wks -- DFE/OCT, possible injection  2,3. Hypertensive retinopathy OU - discussed importance of tight  BP control - monitor  4. Pseudophakia OU  - s/p CE/IOL OU (Dr. Alben Spittle)  - IOL in good position, doing well  - monitor  Ophthalmic Meds Ordered this visit:  No orders of the defined types were placed in this encounter.    No follow-ups on file.  There are no Patient Instructions on file for this visit.   Explained the diagnoses, plan, and follow up with the patient and they expressed understanding.  Patient expressed understanding of the importance of proper follow up care.   This document serves as a record of services personally performed by Karie Chimera, MD, PhD. It was created on their behalf by Annalee Genta, COMT. The creation of this record is the provider's dictation and/or activities during the visit.  Electronically signed by: Annalee Genta, COMT 04/23/23 10:24 AM   Karie Chimera, M.D., Ph.D. Diseases & Surgery of the Retina and Vitreous [facility]    Abbreviations: M myopia (nearsighted); A astigmatism; H hyperopia (farsighted); P presbyopia; Mrx spectacle prescription;  CTL contact lenses; OD right eye; OS left eye; OU both eyes  XT exotropia; ET esotropia; PEK punctate epithelial keratitis; PEE punctate epithelial erosions; DES dry eye syndrome; MGD meibomian gland dysfunction; ATs artificial tears; PFAT's preservative free artificial tears; NSC nuclear sclerotic cataract; PSC posterior subcapsular cataract; ERM epi-retinal membrane; PVD posterior vitreous detachment; RD retinal detachment; DM diabetes mellitus; DR diabetic retinopathy; NPDR non-proliferative  diabetic retinopathy; PDR proliferative diabetic retinopathy; CSME clinically significant macular edema; DME diabetic macular edema; dbh dot blot hemorrhages; CWS cotton wool spot; POAG primary open angle glaucoma; C/D cup-to-disc ratio; HVF humphrey visual field; GVF goldmann visual field; OCT optical coherence tomography; IOP intraocular pressure; BRVO Branch retinal vein occlusion; CRVO central retinal vein occlusion; CRAO central retinal artery occlusion; BRAO branch retinal artery occlusion; RT retinal tear; SB scleral buckle; PPV pars plana vitrectomy; VH Vitreous hemorrhage; PRP panretinal laser photocoagulation; IVK intravitreal kenalog; VMT vitreomacular traction; MH Macular hole;  NVD neovascularization of the disc; NVE neovascularization elsewhere; AREDS age related eye disease study; ARMD age related macular degeneration; POAG primary open angle glaucoma; EBMD epithelial/anterior basement membrane dystrophy; ACIOL anterior chamber intraocular lens; IOL intraocular lens; PCIOL posterior chamber intraocular lens; Phaco/IOL phacoemulsification with intraocular lens placement; PRK photorefractive keratectomy; LASIK laser assisted in situ keratomileusis; HTN hypertension; DM diabetes mellitus; COPD chronic obstructive pulmonary disease

## 2023-04-24 ENCOUNTER — Ambulatory Visit
Admission: EM | Admit: 2023-04-24 | Discharge: 2023-04-24 | Disposition: A | Discharge status: Home / Self Care | Payer: Medicare Other | Attending: Nurse Practitioner | Admitting: Nurse Practitioner

## 2023-04-24 DIAGNOSIS — J101 Influenza due to other identified influenza virus with other respiratory manifestations: Secondary | ICD-10-CM | POA: Diagnosis not present

## 2023-04-24 LAB — POC COVID19/FLU A&B COMBO
Covid Antigen, POC: NEGATIVE
Influenza A Antigen, POC: POSITIVE — AB
Influenza B Antigen, POC: NEGATIVE

## 2023-04-24 MED ORDER — OSELTAMIVIR PHOSPHATE 75 MG PO CAPS: 75.0000 mg | ORAL_CAPSULE | Freq: Two times a day (BID) | ORAL | 0 refills | Status: AC

## 2023-04-24 MED ORDER — FLUTICASONE PROPIONATE 50 MCG/ACT NA SUSP: 2.0000 | Freq: Every day | NASAL | 0 refills | Status: DC

## 2023-04-24 MED ORDER — PROMETHAZINE-DM 6.25-15 MG/5ML PO SYRP: 5.0000 mL | ORAL_SOLUTION | Freq: Four times a day (QID) | ORAL | 0 refills | Status: AC | PRN

## 2023-04-24 NOTE — ED Triage Notes (Signed)
 Pt reports sore throat, cough, congestion, body ache chills x 1 day.

## 2023-04-24 NOTE — Discharge Instructions (Signed)
 You tested positive for influenza A. Take medication as prescribed. Increase fluids and allow for plenty of rest. May take over-the-counter Tylenol  as needed for pain, fever, or general discomfort. Make sure you are drinking plenty of fluids.  Recommend Pedialyte or Gatorlyte to prevent dehydration. Recommend using a humidifier in your bedroom at nighttime during sleep and sleeping elevated on pillows while cough symptoms persist. Warm salt water gargles 3-4 times daily as needed for throat pain or discomfort.  She can also use over-the-counter Chloraseptic throat spray or throat lozenges for symptoms. Symptoms should improve over the next 5 to 7 days.  If symptoms do not improve, or worsen, you may follow-up in this clinic or with your primary care physician for further evaluation. Follow-up as needed.

## 2023-04-24 NOTE — ED Provider Notes (Signed)
 [facility]    CSN: [id number] Arrival date & time: 04/24/23  1800      History   Chief Complaint No chief complaint on file.   HPI Jorge [name] is a 78 y.o. male.   The history is provided by the patient.   Patient presents with a 1 day history of fatigue, chills, body aches, sore throat, nasal congestion, and cough.  Patient denies fever, headache, ear pain, wheezing, difficulty breathing, chest pain, abdominal pain, nausea, vomiting, diarrhea, or rash.  Patient denies any obvious known sick contacts.  Reported he has been taking over-the-counter Mucinex for his symptoms.  Past Medical History:  Diagnosis Date   Arthritis    CAD (coronary artery disease)    PTCA diagonal 2001, residual 60% LAD /  Nuclear 2006, no ischemia   Carotid artery disease (HCC)    Doppler, October, 2012, 0-39% bilateral stenoses.   Diverticulitis    Dyslipidemia    Ejection fraction    EF normal,   Eye abnormalities 08-23-2011   right ryr injection for bleed done by dr elner, dr elner told pt ok to have surgery 09-03-2011   Family history of anesthesia complication    SISTER HAD MEMORY PROBLEMS POST OP   Nuclear sclerotic cataract of left eye 09/23/2019   The nature of cataract was discussed with the patient as well as the elective nature of surgery. The patient was reassured that surgery at a later date does not put the patient at risk for a worse outcome. It was emphasized that the need for surgery is dictated by the patient's quality of life as influenced by the cataract. Patient was instructed to maintain close follow up with their general eye    Preop cardiovascular exam    Patient needs cardiac clearance for hernia surgery May, 2013   Psoriatic arthritis (HCC)    2012   PVC's (premature ventricular contractions)    Frequent PVCs noted over the years., These always decrease with exercise. 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/2013   GIB (gastrointestinal bleeding) 10/28/2013   Back pain 10/28/2013   Decreased pedal pulses 10/28/2013   Preop cardiovascular exam    Carotid artery disease (HCC)    Left inguinal hernia 07/31/2011   Umbilical hernia 07/31/2011   Psoriatic arthritis (HCC)    Dyslipidemia    Coronary artery disease involving native coronary artery of native heart without angina pectoris    Ejection fraction    PVC's (premature ventricular contractions)     Past Surgical History:  Procedure Laterality Date   ABLATION  06/09/2019   ANGIOPLASTY  12/1999 and 03/2000   CARDIAC CATHETERIZATION  2001   carpel tunnel  2009/2010   both wrists   COLONOSCOPY N/A 10/30/2013   Procedure: COLONOSCOPY;  Surgeon: Belvie JONETTA Just, MD;  Location: [facility] ENDOSCOPY;  Service: Endoscopy;  Laterality: N/A;   ESOPHAGOGASTRODUODENOSCOPY N/A 10/30/2013   Procedure: ESOPHAGOGASTRODUODENOSCOPY (EGD);  Surgeon: Belvie JONETTA Just, MD;  Location: [facility] ENDOSCOPY;  Service: Endoscopy;  Laterality: N/A;   INGUINAL  HERNIA REPAIR  09/03/2011   Procedure: HERNIA REPAIR INGUINAL ADULT;  Surgeon: Donnice POUR. Belinda, MD;  Location: [facility] ORS;  Service: General;  Laterality: Left;  Left Ingunal Hernia Repair with Mesh   PVC ABLATION N/A 06/09/2019   Procedure: PVC ABLATION;  Surgeon: Waddell Danelle ORN, MD;  Location: [facility] INVASIVE CV LAB;  Service: Cardiovascular;  Laterality: N/A;   right knee meniscus repair  1990's   UMBILICAL HERNIA REPAIR  09/03/2011   Procedure: HERNIA REPAIR UMBILICAL ADULT;  Surgeon: Donnice POUR. Belinda, MD;  Location: [facility] ORS;   Service: General;  Laterality: N/A;       Home Medications    Prior to Admission medications   Medication Sig Start Date End Date Taking? Authorizing Provider  fluticasone  (FLONASE ) 50 MCG/ACT nasal spray Place 2 sprays into both nostrils daily. 04/24/23  Yes Leath-Warren, Etta PARAS, NP  oseltamivir  (TAMIFLU ) 75 MG capsule Take 1 capsule (75 mg total) by mouth every 12 (twelve) hours. 04/24/23  Yes Leath-Warren, Etta PARAS, NP  promethazine -dextromethorphan (PROMETHAZINE -DM) 6.25-15 MG/5ML syrup Take 5 mLs by mouth 4 (four) times daily as needed. 04/24/23  Yes Leath-Warren, Etta PARAS, NP  amoxicillin  (AMOXIL ) 500 MG capsule Take 1 capsule by mouth every 6 (six) hours. 05/14/19   [provider]  aspirin  EC 81 MG tablet Take 81 mg by mouth daily.    [provider]  atorvastatin  (LIPITOR) 80 MG tablet Take 40 mg by mouth daily.     [provider]  ezetimibe  (ZETIA ) 10 MG tablet Take 10 mg by mouth daily with breakfast.    [provider]  fish oil-omega-3 fatty acids  1000 MG capsule Take 2 g by mouth daily with breakfast.     [provider]  metoprolol  succinate (TOPROL -XL) 25 MG 24 hr tablet TAKE 1/2 TABLET BY MOUTH EVERY DAY WITH BREAKFAST 08/01/17   Gerhardt, Lori C, NP  Multiple Vitamin (MULTIVITAMIN) capsule Take 1 capsule by mouth daily.      [provider]  MYRBETRIQ  50 MG TB24 tablet Take 50 mg by mouth daily. 02/05/19   [provider]  pantoprazole  (PROTONIX ) 40 MG tablet Take 1 tablet (40 mg total) by mouth daily. 10/30/13   Alto Isaiah CROME, NP  Tofacitinib  Citrate 5 MG TABS Take 5 mg by mouth daily.    [provider]    Family History Family History  Problem Relation Age of Onset   Cancer Mother        uterine   Cancer Sister        Breast   Crohn's disease Cousin        distant   Colon cancer Neg Hx    Ulcerative colitis Neg Hx     Social History Social History   Tobacco Use   Smoking status:  Former    Current packs/day: 0.00    Average packs/day: 1.5 packs/day for 25.0 years (37.5 ttl pk-yrs)    Types: Cigarettes    Start date: 12/07/1969    Quit date: 12/08/1994    Years since quitting: 28.3   Smokeless tobacco: Former  Building Services Engineer status: Never Used  Substance Use Topics   Alcohol use: Yes    Comment: 2 drink per day   Drug use: No     Allergies   Niacin   Review of Systems Review of Systems Per HPI  Physical Exam Triage Vital Signs ED Triage Vitals  Encounter Vitals Group     BP 04/24/23 1906 117/77  Systolic BP Percentile --      Diastolic BP Percentile --      Pulse Rate 04/24/23 1906 97     Resp 04/24/23 1906 18     Temp 04/24/23 1906 99.6 F (37.6 C)     Temp Source 04/24/23 1906 Oral     SpO2 04/24/23 1906 94 %     Weight --      Height --      Head Circumference --      Peak Flow --      Pain Score 04/24/23 1909 0     Pain Loc --      Pain Education --      Exclude from Growth Chart --    No data found.  Updated Vital Signs BP 117/77 (BP Location: Right Arm)   Pulse 97   Temp 99.6 F (37.6 C) (Oral)   Resp 18   SpO2 94%   Visual Acuity Right Eye Distance:   Left Eye Distance:   Bilateral Distance:    Right Eye Near:   Left Eye Near:    Bilateral Near:     Physical Exam Vitals and nursing note reviewed.  Constitutional:      General: He is not in acute distress.    Appearance: Normal appearance.  HENT:     Head: Normocephalic.     Right Ear: Tympanic membrane, ear canal and external ear normal.     Left Ear: Tympanic membrane, ear canal and external ear normal.     Nose: Congestion present.     Right Turbinates: Enlarged and swollen.     Left Turbinates: Enlarged and swollen.     Right Sinus: No maxillary sinus tenderness or frontal sinus tenderness.     Left Sinus: No maxillary sinus tenderness or frontal sinus tenderness.     Mouth/Throat:     Lips: Pink.     Mouth: Mucous membranes are moist.      Pharynx: Uvula midline. Posterior oropharyngeal erythema and postnasal drip present. No pharyngeal swelling, oropharyngeal exudate or uvula swelling.     Comments: Cobblestoning present to posterior oropharynx  Eyes:     Extraocular Movements: Extraocular movements intact.     Conjunctiva/sclera: Conjunctivae normal.     Pupils: Pupils are equal, round, and reactive to light.  Cardiovascular:     Rate and Rhythm: Normal rate and regular rhythm.     Pulses: Normal pulses.     Heart sounds: Normal heart sounds.  Pulmonary:     Effort: Pulmonary effort is normal. No respiratory distress.     Breath sounds: Normal breath sounds. No stridor. No wheezing, rhonchi or rales.  Abdominal:     General: Bowel sounds are normal.     Palpations: Abdomen is soft.     Tenderness: There is no abdominal tenderness.  Musculoskeletal:     Cervical back: Normal range of motion.  Skin:    General: Skin is warm and dry.  Neurological:     General: No focal deficit present.     Mental Status: He is alert and oriented to person, place, and time.  Psychiatric:        Mood and Affect: Mood normal.        Behavior: Behavior normal.      UC Treatments / Results  Labs (all labs ordered are listed, but only abnormal results are displayed) Labs Reviewed  POC COVID19/FLU A&B COMBO - Abnormal; Notable for the following components:  Result Value   Influenza A Antigen, POC Positive (*)    All other components within normal limits    EKG   Radiology No results found.  Procedures Procedures (including critical care time)  Medications Ordered in UC Medications - No data to display  Initial Impression / Assessment and Plan / UC Course  I have reviewed the triage vital signs and the nursing notes.  Pertinent labs & imaging results that were available during my care of the patient were reviewed by me and considered in my medical decision making (see chart for details).  COVID/flu test was  positive for influenza A.  Will start Tamiflu  75 mg twice daily for the next 5 days.  Symptomatic treatment provided to include Promethazine  DM for his cough, and fluticasone  50 micro nasal spray for nasal congestion.  Supportive care recommendations were provided and discussed with the patient to include fluids, rest, over-the-counter analgesics, and use of a humidifier at nighttime during sleep.  Discussed indications with the patient regarding follow-up.  Patient was in agreement with this plan of care and verbalizes understanding.  All questions were answered.  Patient stable for discharge.  Final Clinical Impressions(s) / UC Diagnoses   Final diagnoses:  Influenza A     Discharge Instructions      You tested positive for influenza A. Take medication as prescribed. Increase fluids and allow for plenty of rest. May take over-the-counter Tylenol  as needed for pain, fever, or general discomfort. Make sure you are drinking plenty of fluids.  Recommend Pedialyte or Gatorlyte to prevent dehydration. Recommend using a humidifier in your bedroom at nighttime during sleep and sleeping elevated on pillows while cough symptoms persist. Warm salt water gargles 3-4 times daily as needed for throat pain or discomfort.  She can also use over-the-counter Chloraseptic throat spray or throat lozenges for symptoms. Symptoms should improve over the next 5 to 7 days.  If symptoms do not improve, or worsen, you may follow-up in this clinic or with your primary care physician for further evaluation. Follow-up as needed.     ED Prescriptions     Medication Sig Dispense Auth. Provider   oseltamivir  (TAMIFLU ) 75 MG capsule Take 1 capsule (75 mg total) by mouth every 12 (twelve) hours. 10 capsule Leath-Warren, Etta PARAS, NP   promethazine -dextromethorphan (PROMETHAZINE -DM) 6.25-15 MG/5ML syrup Take 5 mLs by mouth 4 (four) times daily as needed. 118 mL Leath-Warren, Etta PARAS, NP   fluticasone  (FLONASE ) 50  MCG/ACT nasal spray Place 2 sprays into both nostrils daily. 16 g Leath-Warren, Etta PARAS, NP      PDMP not reviewed this encounter.   Gilmer Etta PARAS, NP 04/24/23 2000

## 2023-04-25 ENCOUNTER — Encounter (INDEPENDENT_AMBULATORY_CARE_PROVIDER_SITE_OTHER): Payer: Medicare Other | Admitting: Ophthalmology

## 2023-04-25 DIAGNOSIS — H34811 Central retinal vein occlusion, right eye, with macular edema: Secondary | ICD-10-CM

## 2023-04-25 DIAGNOSIS — H35033 Hypertensive retinopathy, bilateral: Secondary | ICD-10-CM

## 2023-04-25 DIAGNOSIS — Z961 Presence of intraocular lens: Secondary | ICD-10-CM

## 2023-04-25 DIAGNOSIS — I10 Essential (primary) hypertension: Secondary | ICD-10-CM

## 2023-05-03 DIAGNOSIS — R350 Frequency of micturition: Secondary | ICD-10-CM | POA: Diagnosis not present

## 2023-05-03 DIAGNOSIS — R351 Nocturia: Secondary | ICD-10-CM | POA: Diagnosis not present

## 2023-05-03 DIAGNOSIS — R3915 Urgency of urination: Secondary | ICD-10-CM | POA: Diagnosis not present

## 2023-05-06 NOTE — Progress Notes (Signed)
[facility] - Clinic Note  05/07/2023     CHIEF COMPLAINT Patient presents for Retina Follow Up   HISTORY OF PRESENT ILLNESS: Jorge [name] is a 78 y.o. male who presents to the clinic today for:   HPI     Retina Follow Up   Patient presents with  CRVO/BRVO.  In right eye.  This started 8 weeks ago.  I, the attending physician,  performed the HPI with the patient and updated documentation appropriately.        Comments   Patient states the vision is the same. He is not using eye drops at this time. Just got over the flu.      Last edited by Rennis Chris, MD on 05/07/2023  9:39 AM.    Patient is delayed to follow up from 8 weeks to 9+ weeks due to being sick, he hasn't noticed any change in vision   Referring physician: [name], MD [address] [place],  [place] [address]  HISTORICAL INFORMATION:   Selected notes from the MEDICAL RECORD NUMBER Pt transferring care from Dr. 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 (Ophthalmic Drugs)   No current facility-administered medications for this visit. (Ophthalmic Drugs)   Current Outpatient Medications (Other)  Medication Sig   amoxicillin (AMOXIL) 500 MG capsule Take 1 capsule by mouth every 6 (six) hours.   aspirin EC 81 MG tablet Take 81 mg by mouth daily.   atorvastatin (LIPITOR) 80 MG tablet Take 40 mg by mouth daily.    ezetimibe (ZETIA) 10 MG tablet Take 10 mg by mouth daily with breakfast.   fish oil-omega-3 fatty acids 1000 MG capsule Take 2 g by mouth daily with breakfast.    fluticasone (FLONASE) 50 MCG/ACT nasal spray Place 2 sprays into both nostrils daily.   metoprolol succinate (TOPROL-XL) 25 MG 24 hr tablet TAKE 1/2 TABLET BY MOUTH EVERY DAY WITH BREAKFAST   Multiple Vitamin  (MULTIVITAMIN) capsule Take 1 capsule by mouth daily.     MYRBETRIQ 50 MG TB24 tablet Take 50 mg by mouth daily.   oseltamivir (TAMIFLU) 75 MG capsule Take 1 capsule (75 mg total) by mouth every 12 (twelve) hours.   pantoprazole (PROTONIX) 40 MG tablet Take 1 tablet (40 mg total) by mouth daily.   promethazine-dextromethorphan (PROMETHAZINE-DM) 6.25-15 MG/5ML syrup Take 5 mLs by mouth 4 (four) times daily as needed.   Tofacitinib Citrate 5 MG TABS Take 5 mg by mouth daily.   No current facility-administered medications for this visit. (Other)   REVIEW OF SYSTEMS: ROS   Positive for: Gastrointestinal, Cardiovascular, Eyes Negative for: Constitutional, Neurological, Skin, Genitourinary, Musculoskeletal, HENT, Endocrine, Respiratory, Psychiatric, Allergic/Imm, Heme/Lymph Last edited by Charlette Caffey, COT on 05/07/2023  8:20 AM.     ALLERGIES Allergies  Allergen Reactions   Niacin Itching and Swelling   PAST MEDICAL HISTORY Past Medical History:  Diagnosis Date   Arthritis    CAD (coronary artery disease)    PTCA diagonal 2001, residual 60% LAD /  Nuclear 2006, no ischemia   Carotid artery disease (HCC)    Doppler, October, 2012, 0-39% bilateral stenoses.   Diverticulitis    Dyslipidemia    Ejection fraction    EF normal,   Eye abnormalities 08-23-2011   right ryr  injection for bleed done by dr Luciana Axe, dr Luciana Axe told pt ok to have surgery 09-03-2011   Family history of anesthesia complication    SISTER HAD MEMORY PROBLEMS POST OP   Nuclear sclerotic cataract of left eye 09/23/2019   The nature of cataract was discussed with the patient as well as the elective nature of surgery. The patient was reassured that surgery at a later date does not put the patient at risk for a worse outcome. It was emphasized that the need for surgery is dictated by the patient's quality of life as influenced by the cataract. Patient was instructed to maintain close follow up with their general eye     Preop cardiovascular exam    Patient needs cardiac clearance for hernia surgery May, 2013   Psoriatic arthritis (HCC)    2012   PVC's (premature ventricular contractions)    Frequent PVCs noted over the years., These always decrease with exercise. This is documented   Umbilical hernia 07/31/2011   Past Surgical History:  Procedure Laterality Date   ABLATION  06/09/2019   ANGIOPLASTY  12/1999 and 03/2000   CARDIAC CATHETERIZATION  2001   carpel tunnel  2009/2010   both wrists   COLONOSCOPY N/A 10/30/2013   Procedure: COLONOSCOPY;  Surgeon: Theda Belfast, MD;  [facility];  Service: Endoscopy;  Laterality: N/A;   ESOPHAGOGASTRODUODENOSCOPY N/A 10/30/2013   Procedure: ESOPHAGOGASTRODUODENOSCOPY (EGD);  Surgeon: Theda Belfast, MD;  Location: [facility] ENDOSCOPY;  Service: Endoscopy;  Laterality: N/A;   INGUINAL HERNIA REPAIR  09/03/2011   Procedure: HERNIA REPAIR INGUINAL ADULT;  Surgeon: [name], MD;  Location: [facility] ORS;  Service: General;  Laterality: Left;  Left Ingunal Hernia Repair with Mesh   PVC ABLATION N/A 06/09/2019   Procedure: PVC ABLATION;  Surgeon: Marinus Maw, MD;  Location: [facility] INVASIVE CV LAB;  Service: Cardiovascular;  Laterality: N/A;   right knee meniscus repair  1990's   UMBILICAL HERNIA REPAIR  09/03/2011   Procedure: HERNIA REPAIR UMBILICAL ADULT;  Surgeon: [name], MD;  Location: [facility] ORS;  Service: General;  Laterality: N/A;   FAMILY HISTORY Family History  Problem Relation Age of Onset   Cancer Mother        uterine   Cancer Sister        Breast   Crohn's disease Cousin        distant   Colon cancer Neg Hx    Ulcerative colitis Neg Hx    SOCIAL HISTORY Social History   Tobacco Use   Smoking status: Former    Current packs/day: 0.00    Average packs/day: 1.5 packs/day for 25.0 years (37.5 ttl pk-yrs)    Types: Cigarettes    Start date: 12/07/1969    Quit date: 12/08/1994    Years since quitting: 28.4   Smokeless tobacco: Former   Building services engineer status: Never Used  Substance Use Topics   Alcohol use: Yes    Comment: 2 drink per day   Drug use: No       OPHTHALMIC EXAM:  Base Eye Exam     Visual Acuity (Snellen - Linear)       Right Left   Dist cc 20/30 +2 20/20   Dist ph cc 20/25 +2          Tonometry (Tonopen, 8:23 AM)       Right Left   Pressure 15 12         Pupils  Dark Light Shape React APD   Right 3 2 Round Brisk None   Left 3 2 Round Brisk None         Visual Fields       Left Right    Full Full         Extraocular Movement       Right Left    Full, Ortho Full, Ortho         Neuro/Psych     Oriented x3: Yes   Mood/Affect: Normal         Dilation     Both eyes: 1.0% Mydriacyl, 2.5% Phenylephrine @ 8:21 AM           Slit Lamp and Fundus Exam     Slit Lamp Exam       Right Left   Lids/Lashes Dermatochalasis - upper lid Dermatochalasis - upper lid   Conjunctiva/Sclera nasal pingeucula Mild nasal and temporal pinguecula   Cornea arcus, well healed cataract wound, trace PEE, mild tear film debris, small sub epi scar at 0400 paracentral arcus, tear film debris, trace Punctate epithelial erosions, well healed cataract wound   Anterior Chamber deep and clear deep and clear   Iris Round and dilated Round and dilated   Lens PC IOL in good position PC IOL in good position   Anterior Vitreous Vitreous syneresis, +silicone micro bubbles Mild syeresis         Fundus Exam       Right Left   Posterior Vitreous Posterior vitreous detachment, vitreous condensations Posterior vitreous detachment   Disc Pink and Sharp, vascular loops, patchy hyperemia, PPP temporally Pink and Sharp, mild PPA   C/D Ratio 0.2 0.3   Macula Flat, Blunted foveal reflex, focal IRF and edema temporal fovea and mac -- improved / resolved Flat, Good foveal reflex, RPE mottling, No heme or edema   Vessels attenuated, Tortuous, old CRVO attenuated, mild tortuosity, mild AV  crossing changes   Periphery Attached, No heme Attached, No heme           IMAGING AND PROCEDURES  Imaging and Procedures for 05/07/2023  OCT, Retina - OU - Both Eyes       Right Eye Quality was poor. Scan locations included subfoveal. Central Foveal Thickness: 268. Progression has improved. Findings include normal foveal contour, no IRF, no SRF (interval improvement in scattered trace cystic changes ).   Left Eye Quality was good. Scan locations included subfoveal. Central Foveal Thickness: 285. Progression has been stable. Findings include normal foveal contour, no IRF, no SRF.   Notes *Images captured and stored on drive  Diagnosis / Impression:  OD: CRVO - interval improvement in scattered trace cystic changes  OS: NFP, no IRF/SRF  Clinical management:  See below  Abbreviations: NFP - Normal foveal profile. CME - cystoid macular edema. PED - pigment epithelial detachment. IRF - intraretinal fluid. SRF - subretinal fluid. EZ - ellipsoid zone. ERM - epiretinal membrane. ORA - outer retinal atrophy. ORT - outer retinal tubulation. SRHM - subretinal hyper-reflective material. IRHM - intraretinal hyper-reflective material        Intravitreal Injection, Pharmacologic Agent - OD - Right Eye       Time Out 05/07/2023. 8:58 AM. Confirmed correct patient, procedure, site, and patient consented.   Anesthesia Topical anesthesia was used. Anesthetic medications included Lidocaine 2%, Proparacaine 0.5%.   Procedure Preparation included 5% betadine to ocular surface, eyelid speculum. A (32g) needle was used.   Injection: 1.25 mg Bevacizumab 1.25mg /0.1ml  Route: Intravitreal, Site: Right Eye   NDC: [id number], Lot: [id number], Expiration date: 06/06/2023   Post-op Post injection exam found visual acuity of at least counting fingers. The patient tolerated the procedure well. There were no complications. The patient received written and verbal post procedure care education.             ASSESSMENT/PLAN:   ICD-10-CM   1. 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 Essential hypertension  I10     3. Hypertensive retinopathy of both eyes  H35.033     4. Pseudophakia, both eyes  Z96.1      CRVO w/ CME, OD  - Dr. Luciana Axe pt here due to insurance reasons  - onset of CRVO ~8 yrs ago per pt report (~2015-2016)  - history of multiple IVA OD -- on 14 wk maintenance schedule w/ [facility]  - last injxn w [facility] -- IVA OD on 09.12.23 (18 wks prior to 1st visit/injxn here) - s/p IVA OD #1 (01.18.24), #2 (04.11.24), #3 (05.23.24), #4 (07.18.24), #5 (09.05.24), #6 (10.24.24), #7 (12.12.24)  **increase in IRF at 8 wks on 07.18.24 visit**  - BCVA OD 20/30 from 20/40  - OCT shows interval improvement in scattered tr cystic changes at 9+ wks  - recommend IVA OD #8 today, 02.18.25 w/ f/u ext to 11 wks  - pt wishes to proceed with injection  - RBA of procedure discussed, questions answered - informed consent obtained and signed 01.18.24  - see procedure note - Eylea approved for 2024 - f/u 11 wks -- DFE/OCT, possible injection  2,3. Hypertensive retinopathy OU - discussed importance of tight BP control - monitor  4. Pseudophakia OU  - s/p CE/IOL OU (Dr. Alben Spittle)  - IOL in good position, doing well  - monitor  Ophthalmic Meds Ordered this visit:  Meds ordered this encounter  Medications   Bevacizumab (AVASTIN) SOLN 1.25 mg     Return in about 11 weeks (around 07/23/2023) for CRVO OD, Dilated Exam, OCT, Possible Injxn.  There are no Patient Instructions on file for this visit.   Explained the diagnoses, plan, and follow up with the patient and they expressed understanding.  Patient expressed understanding of the importance of proper follow up care.   This document serves as a record of services  personally performed by Karie Chimera, MD, PhD. It was created on their behalf by Charlette Caffey, COT an ophthalmic technician. The creation of this record is the provider's dictation and/or activities during the visit.    Electronically signed by:  Charlette Caffey, COT  05/08/23 3:18 PM  This document serves as a record of services personally performed by Karie Chimera, MD, PhD. It was created on their behalf by Glee Arvin. Manson Passey, OA an ophthalmic technician. The creation of this record is the provider's dictation and/or activities during the visit.    Electronically signed by: Glee Arvin. Manson Passey, OA 05/08/23 3:18 PM   Karie Chimera, M.D., Ph.D. Diseases & Surgery of the Retina and Vitreous [facility]  I have reviewed the above documentation for accuracy and completeness, and I agree with the above. Karie Chimera, M.D., Ph.D. 05/08/23 3:20 PM   Abbreviations: M myopia (nearsighted); A astigmatism; H hyperopia (farsighted); P presbyopia; Mrx spectacle prescription;  CTL contact lenses; OD right eye; OS left eye; OU both eyes  XT exotropia; ET esotropia; PEK punctate epithelial keratitis; PEE punctate epithelial erosions; DES dry eye syndrome; MGD meibomian gland dysfunction; ATs artificial tears; PFAT's preservative free artificial tears; NSC nuclear sclerotic cataract; PSC posterior subcapsular cataract; ERM epi-retinal membrane; PVD posterior vitreous detachment; RD retinal detachment; DM diabetes mellitus; DR diabetic retinopathy; NPDR non-proliferative diabetic retinopathy; PDR proliferative diabetic retinopathy; CSME clinically significant macular edema; DME diabetic macular edema; dbh dot blot hemorrhages; CWS cotton wool spot; POAG primary open angle glaucoma; C/D cup-to-disc ratio; HVF humphrey visual field; GVF goldmann visual field; OCT optical coherence tomography; IOP intraocular pressure; BRVO Branch retinal vein occlusion; CRVO central retinal vein  occlusion; CRAO central retinal artery occlusion; BRAO branch retinal artery occlusion; RT retinal tear; SB scleral buckle; PPV pars plana vitrectomy; VH Vitreous hemorrhage; PRP panretinal laser photocoagulation; IVK intravitreal kenalog; VMT vitreomacular traction; MH Macular hole;  NVD neovascularization of the disc; NVE neovascularization elsewhere; AREDS age related eye disease study; ARMD age related macular degeneration; POAG primary open angle glaucoma; EBMD epithelial/anterior basement membrane dystrophy; ACIOL anterior chamber intraocular lens; IOL intraocular lens; PCIOL posterior chamber intraocular lens; Phaco/IOL phacoemulsification with intraocular lens placement; PRK photorefractive keratectomy; LASIK laser assisted in situ keratomileusis; HTN hypertension; DM diabetes mellitus; COPD chronic obstructive pulmonary disease

## 2023-05-07 ENCOUNTER — Ambulatory Visit (INDEPENDENT_AMBULATORY_CARE_PROVIDER_SITE_OTHER): Payer: Medicare Other | Admitting: Ophthalmology

## 2023-05-07 ENCOUNTER — Encounter (INDEPENDENT_AMBULATORY_CARE_PROVIDER_SITE_OTHER): Payer: Self-pay | Admitting: Ophthalmology

## 2023-05-07 DIAGNOSIS — H35033 Hypertensive retinopathy, bilateral: Secondary | ICD-10-CM | POA: Diagnosis not present

## 2023-05-07 DIAGNOSIS — Z961 Presence of intraocular lens: Secondary | ICD-10-CM | POA: Diagnosis not present

## 2023-05-07 DIAGNOSIS — I10 Essential (primary) hypertension: Secondary | ICD-10-CM

## 2023-05-07 DIAGNOSIS — H34811 Central retinal vein occlusion, right eye, with macular edema: Secondary | ICD-10-CM | POA: Diagnosis not present

## 2023-05-07 MED ORDER — BEVACIZUMAB CHEMO INJECTION 1.25MG/0.05ML SYRINGE FOR KALEIDOSCOPE
1.2500 mg | INTRAVITREAL | Status: AC | PRN
  Administered 2023-05-07: 1.25 mg via INTRAVITREAL

## 2023-05-09 ENCOUNTER — Encounter (INDEPENDENT_AMBULATORY_CARE_PROVIDER_SITE_OTHER): Payer: Medicare Other | Admitting: Ophthalmology

## 2023-05-23 DIAGNOSIS — H52223 Regular astigmatism, bilateral: Secondary | ICD-10-CM | POA: Diagnosis not present

## 2023-05-23 DIAGNOSIS — H5310 Unspecified subjective visual disturbances: Secondary | ICD-10-CM | POA: Diagnosis not present

## 2023-05-23 DIAGNOSIS — H0279 Other degenerative disorders of eyelid and periocular area: Secondary | ICD-10-CM | POA: Diagnosis not present

## 2023-05-23 DIAGNOSIS — H02831 Dermatochalasis of right upper eyelid: Secondary | ICD-10-CM | POA: Diagnosis not present

## 2023-05-23 DIAGNOSIS — H02834 Dermatochalasis of left upper eyelid: Secondary | ICD-10-CM | POA: Diagnosis not present

## 2023-07-09 NOTE — Progress Notes (Signed)
 [facility] - Clinic Note  07/23/2023     CHIEF COMPLAINT Patient presents for Retina Follow Up   HISTORY OF PRESENT ILLNESS: Jorge [name] is a 78 y.o. male who presents to the clinic today for:   HPI     Retina Follow Up   Patient presents with  CRVO/BRVO.  In right eye.  Severity is mild.  Duration of 11 weeks.  Since onset it is stable.  I, the attending physician,  performed the HPI with the patient and updated documentation appropriately.        Comments   11 week Retina eval. Patient states no changes in vision      Last edited by Ronelle Coffee, MD on 07/23/2023 12:41 PM.     Patient states his vision is about the same, he feels like he can see better out of the right eye than he can the left   Referring physician: Imelda Man, MD [address] [place],  [place] [address]  HISTORICAL INFORMATION:   Selected notes from the MEDICAL RECORD NUMBER Pt transferring care from Dr. 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 (Ophthalmic Drugs)   No current facility-administered medications for this visit. (Ophthalmic Drugs)   Current Outpatient Medications (Other)  Medication Sig   amoxicillin  (AMOXIL ) 500 MG capsule Take 1 capsule by mouth every 6 (six) hours.   aspirin  EC 81 MG tablet Take 81 mg by mouth daily.   atorvastatin  (LIPITOR) 80 MG tablet Take 40 mg by mouth daily.    ezetimibe  (ZETIA ) 10 MG tablet Take 10 mg by mouth daily with breakfast.   fish oil-omega-3 fatty acids  1000 MG capsule Take 2 g by mouth daily with breakfast.    metoprolol  succinate (TOPROL -XL) 25 MG 24 hr tablet TAKE 1/2 TABLET BY MOUTH EVERY DAY WITH BREAKFAST   Multiple Vitamin (MULTIVITAMIN) capsule Take 1 capsule by mouth daily.     MYRBETRIQ  50 MG TB24 tablet Take 50 mg by  mouth daily.   pantoprazole  (PROTONIX ) 40 MG tablet Take 1 tablet (40 mg total) by mouth daily.   promethazine -dextromethorphan (PROMETHAZINE -DM) 6.25-15 MG/5ML syrup Take 5 mLs by mouth 4 (four) times daily as needed.   Tofacitinib  Citrate 5 MG TABS Take 5 mg by mouth daily.   fluticasone  (FLONASE ) 50 MCG/ACT nasal spray Place 2 sprays into both nostrils daily.   oseltamivir  (TAMIFLU ) 75 MG capsule Take 1 capsule (75 mg total) by mouth every 12 (twelve) hours.   No current facility-administered medications for this visit. (Other)   REVIEW OF SYSTEMS: ROS   Positive for: Gastrointestinal, Cardiovascular, Eyes Negative for: Constitutional, Neurological, Skin, Genitourinary, Musculoskeletal, HENT, Endocrine, Respiratory, Psychiatric, Allergic/Imm, Heme/Lymph Last edited by Leonia Raman, COT on 07/23/2023  8:37 AM.      ALLERGIES Allergies  Allergen Reactions   Niacin Itching and Swelling   PAST MEDICAL HISTORY Past Medical History:  Diagnosis Date   Arthritis    CAD (coronary artery disease)    PTCA diagonal 2001, residual 60% LAD /  Nuclear 2006, no ischemia   Carotid artery disease (HCC)    Doppler, October, 2012, 0-39% bilateral stenoses.   Diverticulitis    Dyslipidemia    Ejection fraction    EF normal,   Eye abnormalities 08-23-2011   right  ryr injection for bleed done by dr Seward Dao, dr Seward Dao told pt ok to have surgery 09-03-2011   Family history of anesthesia complication    SISTER HAD MEMORY PROBLEMS POST OP   Nuclear sclerotic cataract of left eye 09/23/2019   The nature of cataract was discussed with the patient as well as the elective nature of surgery. The patient was reassured that surgery at a later date does not put the patient at risk for a worse outcome. It was emphasized that the need for surgery is dictated by the patient's quality of life as influenced by the cataract. Patient was instructed to maintain close follow up with their general eye    Preop  cardiovascular exam    Patient needs cardiac clearance for hernia surgery May, 2013   Psoriatic arthritis (HCC)    2012   PVC's (premature ventricular contractions)    Frequent PVCs noted over the years., These always decrease with exercise. This is documented   Umbilical hernia 07/31/2011   Past Surgical History:  Procedure Laterality Date   ABLATION  06/09/2019   ANGIOPLASTY  12/1999 and 03/2000   CARDIAC CATHETERIZATION  2001   carpel tunnel  2009/2010   both wrists   COLONOSCOPY N/A 10/30/2013   Procedure: COLONOSCOPY;  Surgeon: Almeda Aris, MD;  Location: [facility] ENDOSCOPY;  Service: Endoscopy;  Laterality: N/A;   ESOPHAGOGASTRODUODENOSCOPY N/A 10/30/2013   Procedure: ESOPHAGOGASTRODUODENOSCOPY (EGD);  Surgeon: Almeda Aris, MD;  Location: [facility] ENDOSCOPY;  Service: Endoscopy;  Laterality: N/A;   INGUINAL HERNIA REPAIR  09/03/2011   Procedure: HERNIA REPAIR INGUINAL ADULT;  Surgeon: Kari Otto. Eli Grizzle, MD;  Location: [facility] ORS;  Service: General;  Laterality: Left;  Left Ingunal Hernia Repair with Mesh   PVC ABLATION N/A 06/09/2019   Procedure: PVC ABLATION;  Surgeon: Tammie Fall, MD;  Location: [facility] INVASIVE CV LAB;  Service: Cardiovascular;  Laterality: N/A;   right knee meniscus repair  1990's   UMBILICAL HERNIA REPAIR  09/03/2011   Procedure: HERNIA REPAIR UMBILICAL ADULT;  Surgeon: Kari Otto. Eli Grizzle, MD;  Location: [facility] ORS;  Service: General;  Laterality: N/A;   FAMILY HISTORY Family History  Problem Relation Age of Onset   Cancer Mother        uterine   Cancer Sister        Breast   Crohn's disease Cousin        distant   Colon cancer Neg Hx    Ulcerative colitis Neg Hx    SOCIAL HISTORY Social History   Tobacco Use   Smoking status: Former    Current packs/day: 0.00    Average packs/day: 1.5 packs/day for 25.0 years (37.5 ttl pk-yrs)    Types: Cigarettes    Start date: 12/07/1969    Quit date: 12/08/1994    Years since quitting: 28.6   Smokeless tobacco: Former  Water quality scientist status: Never Used  Substance Use Topics   Alcohol use: Yes    Comment: 2 drink per day   Drug use: No       OPHTHALMIC EXAM:  Base Eye Exam     Visual Acuity (Snellen - Linear)       Right Left   Dist cc 20/20 20/20    Correction: Glasses         Tonometry (Tonopen, 8:44 AM)       Right Left   Pressure 11 11         Pupils  Dark Light Shape React APD   Right 3 2 Round Brisk None   Left 3 2 Round Brisk None         Visual Fields (Counting fingers)       Left Right    Full Full         Extraocular Movement       Right Left    Full, Ortho Full, Ortho         Neuro/Psych     Oriented x3: Yes   Mood/Affect: Normal         Dilation     Both eyes: 2.5% Phenylephrine @ 8:44 AM           Slit Lamp and Fundus Exam     Slit Lamp Exam       Right Left   Lids/Lashes Dermatochalasis - upper lid Dermatochalasis - upper lid   Conjunctiva/Sclera nasal pingeucula Mild nasal and temporal pinguecula   Cornea arcus, well healed cataract wound, trace PEE, mild tear film debris, small sub epi scar at 0400 paracentral arcus, tear film debris, trace Punctate epithelial erosions, well healed cataract wound   Anterior Chamber deep and clear deep and clear   Iris Round and dilated Round and dilated   Lens PC IOL in good position PC IOL in good position   Anterior Vitreous Vitreous syneresis, +silicone micro bubbles Mild syeresis         Fundus Exam       Right Left   Posterior Vitreous Posterior vitreous detachment, vitreous condensations, Weiss ring Posterior vitreous detachment   Disc Pink and Sharp, vascular loops, patchy hyperemia, PPP temporally Pink and Sharp, mild PPA   C/D Ratio 0.2 0.3   Macula Flat, Blunted foveal reflex, trace cystic changes Flat, Good foveal reflex, RPE mottling, No heme or edema   Vessels attenuated, Tortuous, old CRVO attenuated, Tortuous   Periphery Attached, No heme Attached, No heme            Refraction     Wearing Rx       Sphere Cylinder Axis Add   Right +0.00 +2.25 001 +2.00   Left -0.50 +2.25 165 +2.00           IMAGING AND PROCEDURES  Imaging and Procedures for 07/23/2023  OCT, Retina - OU - Both Eyes        Right Eye Quality was good. Scan locations included subfoveal. Central Foveal Thickness: 268. Progression has been stable. Findings include normal foveal contour, no IRF, no SRF (trace scattered cystic changes ).   Left Eye Quality was good. Scan locations included subfoveal. Central Foveal Thickness: 277. Progression has been stable. Findings include normal foveal contour, no IRF, no SRF.   Notes  *Images captured and stored on drive  Diagnosis / Impression:  OD: CRVO - trace scattered cystic changes OS: NFP, no IRF/SRF  Clinical management:  See below  Abbreviations: NFP - Normal foveal profile. CME - cystoid macular edema. PED - pigment epithelial detachment. IRF - intraretinal fluid. SRF - subretinal fluid. EZ - ellipsoid zone. ERM - epiretinal membrane. ORA - outer retinal atrophy. ORT - outer retinal tubulation. SRHM - subretinal hyper-reflective material. IRHM - intraretinal hyper-reflective material        Intravitreal Injection, Pharmacologic Agent - OD - Right Eye       Time Out 07/23/2023. 9:08 AM. Confirmed correct patient, procedure, site, and patient consented.   Anesthesia Topical anesthesia was used. Anesthetic medications included Lidocaine  2%, Proparacaine 0.5%.  Procedure Preparation included 5% betadine to ocular surface, eyelid speculum. A supplied (32g) needle was used.   Injection: 1.25 mg Bevacizumab  1.25mg /0.45ml   Route: Intravitreal, Site: Right Eye   NDC: [id number], Lot: [id number], Expiration date: 08/15/2023   Post-op Post injection exam found visual acuity of at least counting fingers. The patient tolerated the procedure well. There were no complications. The patient received written and verbal post  procedure care education.             ASSESSMENT/PLAN:   ICD-10-CM   1. Central retinal vein occlusion with macular edema of right eye  H34.8110 OCT, Retina - OU - Both Eyes    Intravitreal Injection, Pharmacologic Agent - OD - Right Eye    Bevacizumab  (AVASTIN ) SOLN 1.25 mg    2. Essential hypertension  I10     3. Hypertensive retinopathy of both eyes  H35.033     4. Pseudophakia, both eyes  Z96.1      CRVO w/ CME, OD  - Dr. Seward Dao pt here due to insurance reasons  - onset of CRVO ~8 yrs ago per pt report (~2015-2016)  - history of multiple IVA OD -- on 14 wk maintenance schedule w/ [facility]  - last injxn w [facility] -- IVA OD on 09.12.23 (18 wks prior to 1st visit/injxn here) - s/p IVA OD #1 (01.18.24), #2 (04.11.24), #3 (05.23.24), #4 (07.18.24), #5 (09.05.24), #6 (10.24.24), #7 (12.12.24), #8 (01.18.25)  **increase in IRF at 8 wks on 07.18.24 visit**  - BCVA OD 20/20 from 20/25  - OCT shows tr scattered cystic changes at 11 wks  - recommend IVA OD #9 today, 05.06.25 w/ f/u in 11 wks  - pt wishes to proceed with injection  - RBA of procedure discussed, questions answered - informed consent obtained and signed 01.18.24  - see procedure note - Eylea approved for 2024 - f/u 11 wks -- DFE/OCT, possible injection  2,3. Hypertensive retinopathy OU - discussed importance of tight BP control - monitor  4. Pseudophakia OU  - s/p CE/IOL OU (Dr. Reyne Cave)  - IOL in good position, doing well  - monitor  Ophthalmic Meds Ordered this visit:  Meds ordered this encounter  Medications   Bevacizumab  (AVASTIN ) SOLN 1.25 mg     Return in about 11 weeks (around 10/08/2023) for f/u CRVO OD, DFE, OCT, Possible Injxn.  There are no Patient Instructions on file for this visit.   Explained the diagnoses, plan, and follow up with the patient and they expressed understanding.  Patient expressed understanding of the importance of proper follow up care.   This document serves as a record of  services personally performed by Jeanice Millard, MD, PhD. It was created on their behalf by Olene Berne, COT an ophthalmic technician. The creation of this record is the provider's dictation and/or activities during the visit.    Electronically signed by:  Olene Berne, COT  07/24/23 10:22 PM  This document serves as a record of services personally performed by Jeanice Millard, MD, PhD. It was created on their behalf by Morley Arabia. Bevin Bucks, OA an ophthalmic technician. The creation of this record is the provider's dictation and/or activities during the visit.    Electronically signed by: Morley Arabia. Bevin Bucks, OA 07/24/23 10:22 PM  Jeanice Millard, M.D., Ph.D. Diseases & Surgery of the Retina and Vitreous [facility]  I have reviewed the above documentation for accuracy and completeness, and I agree with the above. Polly Brink  Ernesta Heading, M.D., Ph.D. 07/24/23 10:26 PM   Abbreviations: M myopia (nearsighted); A astigmatism; H hyperopia (farsighted); P presbyopia; Mrx spectacle prescription;  CTL contact lenses; OD right eye; OS left eye; OU both eyes  XT exotropia; ET esotropia; PEK punctate epithelial keratitis; PEE punctate epithelial erosions; DES dry eye syndrome; MGD meibomian gland dysfunction; ATs artificial tears; PFAT's preservative free artificial tears; NSC nuclear sclerotic cataract; PSC posterior subcapsular cataract; ERM epi-retinal membrane; PVD posterior vitreous detachment; RD retinal detachment; DM diabetes mellitus; DR diabetic retinopathy; NPDR non-proliferative diabetic retinopathy; PDR proliferative diabetic retinopathy; CSME clinically significant macular edema; DME diabetic macular edema; dbh dot blot hemorrhages; CWS cotton wool spot; POAG primary open angle glaucoma; C/D cup-to-disc ratio; HVF humphrey visual field; GVF goldmann visual field; OCT optical coherence tomography; IOP intraocular pressure; BRVO Branch retinal vein occlusion; CRVO central  retinal vein occlusion; CRAO central retinal artery occlusion; BRAO branch retinal artery occlusion; RT retinal tear; SB scleral buckle; PPV pars plana vitrectomy; VH Vitreous hemorrhage; PRP panretinal laser photocoagulation; IVK intravitreal kenalog; VMT vitreomacular traction; MH Macular hole;  NVD neovascularization of the disc; NVE neovascularization elsewhere; AREDS age related eye disease study; ARMD age related macular degeneration; POAG primary open angle glaucoma; EBMD epithelial/anterior basement membrane dystrophy; ACIOL anterior chamber intraocular lens; IOL intraocular lens; PCIOL posterior chamber intraocular lens; Phaco/IOL phacoemulsification with intraocular lens placement; PRK photorefractive keratectomy; LASIK laser assisted in situ keratomileusis; HTN hypertension; DM diabetes mellitus; COPD chronic obstructive pulmonary disease

## 2023-07-22 DIAGNOSIS — L570 Actinic keratosis: Secondary | ICD-10-CM | POA: Diagnosis not present

## 2023-07-22 DIAGNOSIS — D225 Melanocytic nevi of trunk: Secondary | ICD-10-CM | POA: Diagnosis not present

## 2023-07-22 DIAGNOSIS — X32XXXD Exposure to sunlight, subsequent encounter: Secondary | ICD-10-CM | POA: Diagnosis not present

## 2023-07-23 ENCOUNTER — Encounter (INDEPENDENT_AMBULATORY_CARE_PROVIDER_SITE_OTHER): Payer: Self-pay | Admitting: Ophthalmology

## 2023-07-23 ENCOUNTER — Ambulatory Visit (INDEPENDENT_AMBULATORY_CARE_PROVIDER_SITE_OTHER): Payer: Medicare Other | Admitting: Ophthalmology

## 2023-07-23 DIAGNOSIS — Z961 Presence of intraocular lens: Secondary | ICD-10-CM

## 2023-07-23 DIAGNOSIS — I10 Essential (primary) hypertension: Secondary | ICD-10-CM

## 2023-07-23 DIAGNOSIS — H34811 Central retinal vein occlusion, right eye, with macular edema: Secondary | ICD-10-CM

## 2023-07-23 DIAGNOSIS — H35033 Hypertensive retinopathy, bilateral: Secondary | ICD-10-CM | POA: Diagnosis not present

## 2023-07-23 MED ORDER — BEVACIZUMAB CHEMO INJECTION 1.25MG/0.05ML SYRINGE FOR KALEIDOSCOPE
1.2500 mg | INTRAVITREAL | Status: AC | PRN
  Administered 2023-07-23: 1.25 mg via INTRAVITREAL

## 2023-07-26 ENCOUNTER — Encounter (INDEPENDENT_AMBULATORY_CARE_PROVIDER_SITE_OTHER): Payer: Self-pay | Admitting: Otolaryngology

## 2023-07-26 ENCOUNTER — Ambulatory Visit (INDEPENDENT_AMBULATORY_CARE_PROVIDER_SITE_OTHER): Admitting: Otolaryngology

## 2023-07-26 VITALS — BP 161/72 | HR 77 | Ht 69.0 in | Wt 215.0 lb

## 2023-07-26 DIAGNOSIS — J3489 Other specified disorders of nose and nasal sinuses: Secondary | ICD-10-CM | POA: Diagnosis not present

## 2023-07-26 DIAGNOSIS — R0982 Postnasal drip: Secondary | ICD-10-CM

## 2023-07-26 DIAGNOSIS — R042 Hemoptysis: Secondary | ICD-10-CM | POA: Diagnosis not present

## 2023-07-26 DIAGNOSIS — R040 Epistaxis: Secondary | ICD-10-CM

## 2023-07-26 MED ORDER — AMOXICILLIN-POT CLAVULANATE 875-125 MG PO TABS: 1.0000 | ORAL_TABLET | Freq: Two times a day (BID) | ORAL | 0 refills | Status: AC

## 2023-07-26 MED ORDER — AZELASTINE HCL 0.1 % NA SOLN: 2.0000 | Freq: Two times a day (BID) | NASAL | 12 refills | Status: AC

## 2023-07-26 NOTE — Progress Notes (Unsigned)
 Dear Dr. Schuyler Custard, Here is my assessment for our mutual patient, Jorge [name]. Thank you for allowing me the opportunity to care for your patient. Please do not hesitate to contact me should you have any other questions. Sincerely, Dr. Milon Aloe  Otolaryngology Clinic Note Referring provider: Dr. Schuyler Custard HPI:  Jorge [name] is a 78 y.o. male kindly referred by Dr. Schuyler Custard for evaluation of epistaxis and hemoptysis(?)  Initial visit (07/2023): Noted epistaxis he reports ongoing for about 2 years, worse after flonase  use. Mostly reports that he does not have any active nose bleeds but will "blow dark red chunks" first thing in morning. Always on left. Denies any problems with pain, facial numbness, discolored drainage, congestion, epiphora or neck masses or weight loss.  Tried mupirocin, did not work. Not using flonase  currently. Some PND.  Additionally, he also reports some red blood mixed with mucus intermittently first thing in the morning when he coughs vigorously. He has not had any other pulm sx including cough, congestion, SOB. No smoking history. No swallowing difficulty, ear pain. Has never seen pulmonologist.  He denies any AR symptoms. No prior nasal procedures. No liver dysfunction, some kidney dysfunction.   H&N Surgery: denies Personal or FHx of bleeding dz or anesthesia difficulty: no  GLP-1: no AP/AC: ASA 81  Tobacco: denies. Alcohol: no significant use. PMHx: HLD, CAD, HTN, RA, DVT, Ablation for PVC, CKD  Independent Review of Additional Tests or Records:  Dr. Schuyler Custard (IM) referral notes Referral notes reviewed and uploaded or available in chart in media tab (04/04/2023): Noted epistaxis - "left chunks of red from the nose" since starting flonase ; no pain, no fevers; Rx: Mupirocin, flonase , Ref to ENT Labs in media tab (03/29/2023): CBC and BMP: WBC 5.8, Hgb 15.7, Plt 225; BUNCr 18/1.1 MRI Brain 04/23/2018 independently interpreted with respect to sinuses: left middle  turbinate associated lesion but cuts thick so cannot definitively say; no obvious intracranial or orbit extension appreciated; no significant paranasal sinus disease noted otherwise on left or right   PMH/Meds/All/SocHx/FamHx/ROS:   Past Medical History:  Diagnosis Date   Arthritis    CAD (coronary artery disease)    PTCA diagonal 2001, residual 60% LAD /  Nuclear 2006, no ischemia   Carotid artery disease (HCC)    Doppler, October, 2012, 0-39% bilateral stenoses.   Diverticulitis    Dyslipidemia    Ejection fraction    EF normal,   Eye abnormalities 08-23-2011   right ryr injection for bleed done by dr Seward Dao, dr Seward Dao told pt ok to have surgery 09-03-2011   Family history of anesthesia complication    SISTER HAD MEMORY PROBLEMS POST OP   Nuclear sclerotic cataract of left eye 09/23/2019   The nature of cataract was discussed with the patient as well as the elective nature of surgery. The patient was reassured that surgery at a later date does not put the patient at risk for a worse outcome. It was emphasized that the need for surgery is dictated by the patient's quality of life as influenced by the cataract. Patient was instructed to maintain close follow up with their general eye    Preop cardiovascular exam    Patient needs cardiac clearance for hernia surgery May, 2013   Psoriatic arthritis (HCC)    2012   PVC's (premature ventricular contractions)    Frequent PVCs noted over the years., These always decrease with exercise. This is documented   Umbilical hernia 07/31/2011     Past Surgical History:  Procedure  Laterality Date   ABLATION  06/09/2019   ANGIOPLASTY  12/1999 and 03/2000   CARDIAC CATHETERIZATION  2001   carpel tunnel  2009/2010   both wrists   COLONOSCOPY N/A 10/30/2013   Procedure: COLONOSCOPY;  Surgeon: Almeda Aris, MD;  Location: [facility] ENDOSCOPY;  Service: Endoscopy;  Laterality: N/A;   ESOPHAGOGASTRODUODENOSCOPY N/A 10/30/2013   Procedure:  ESOPHAGOGASTRODUODENOSCOPY (EGD);  Surgeon: Almeda Aris, MD;  Location: [facility] ENDOSCOPY;  Service: Endoscopy;  Laterality: N/A;   INGUINAL HERNIA REPAIR  09/03/2011   Procedure: HERNIA REPAIR INGUINAL ADULT;  Surgeon: Kari Otto. Eli Grizzle, MD;  Location: [facility] ORS;  Service: General;  Laterality: Left;  Left Ingunal Hernia Repair with Mesh   PVC ABLATION N/A 06/09/2019   Procedure: PVC ABLATION;  Surgeon: Tammie Fall, MD;  Location: [facility] INVASIVE CV LAB;  Service: Cardiovascular;  Laterality: N/A;   right knee meniscus repair  1990's   UMBILICAL HERNIA REPAIR  09/03/2011   Procedure: HERNIA REPAIR UMBILICAL ADULT;  Surgeon: Kari Otto. Tsuei, MD;  Location: [facility] ORS;  Service: General;  Laterality: N/A;    Family History  Problem Relation Age of Onset   Cancer Mother        uterine   Cancer Sister        Breast   Crohn's disease Cousin        distant   Colon cancer Neg Hx    Ulcerative colitis Neg Hx      Social Connections: Not on file      Current Outpatient Medications:    amoxicillin -clavulanate (AUGMENTIN) 875-125 MG tablet, Take 1 tablet by mouth 2 (two) times daily for 7 days., Disp: 14 tablet, Rfl: 0   aspirin  EC 81 MG tablet, Take 81 mg by mouth daily., Disp: , Rfl:    atorvastatin  (LIPITOR) 80 MG tablet, Take 40 mg by mouth daily. , Disp: , Rfl:    azelastine (ASTELIN) 0.1 % nasal spray, Place 2 sprays into both nostrils 2 (two) times daily. Use in each nostril as directed, Disp: 30 mL, Rfl: 12   ezetimibe  (ZETIA ) 10 MG tablet, Take 10 mg by mouth daily with breakfast., Disp: , Rfl:    fish oil-omega-3 fatty acids  1000 MG capsule, Take 2 g by mouth daily with breakfast. , Disp: , Rfl:    metoprolol  succinate (TOPROL -XL) 25 MG 24 hr tablet, TAKE 1/2 TABLET BY MOUTH EVERY DAY WITH BREAKFAST, Disp: 45 tablet, Rfl: 3   Multiple Vitamin (MULTIVITAMIN) capsule, Take 1 capsule by mouth daily.  , Disp: , Rfl:    MYRBETRIQ  50 MG TB24 tablet, Take 50 mg by mouth daily., Disp: , Rfl:     pantoprazole  (PROTONIX ) 40 MG tablet, Take 1 tablet (40 mg total) by mouth daily., Disp: 30 tablet, Rfl: 1   promethazine -dextromethorphan (PROMETHAZINE -DM) 6.25-15 MG/5ML syrup, Take 5 mLs by mouth 4 (four) times daily as needed., Disp: 118 mL, Rfl: 0   Tofacitinib  Citrate 5 MG TABS, Take 5 mg by mouth daily., Disp: , Rfl:    amoxicillin  (AMOXIL ) 500 MG capsule, Take 1 capsule by mouth every 6 (six) hours. (Patient not taking: Reported on 07/26/2023), Disp: , Rfl:    fluticasone  (FLONASE ) 50 MCG/ACT nasal spray, Place 2 sprays into both nostrils daily., Disp: 16 g, Rfl: 0   oseltamivir  (TAMIFLU ) 75 MG capsule, Take 1 capsule (75 mg total) by mouth every 12 (twelve) hours., Disp: 10 capsule, Rfl: 0   Physical Exam:   BP (!) 161/72 (BP Location: Left Arm, Patient  Position: Sitting, Cuff Size: Large)   Pulse 77   Ht 5\' 9"  (1.753 m)   Wt 215 lb (97.5 kg)   SpO2 94%   BMI 31.75 kg/m   Salient findings:  CN II-XII intact  Bilateral EAC clear and TM intact with well pneumatized middle ear spaces Anterior rhinoscopy: Septum relatively midline, no prominent vessels over septum; bilateral inferior turbinates without significant hypertrophy; Nasal endoscopy was indicated to better evaluate the nose and paranasal sinuses, given the patient's history and exam findings, and is detailed below. No lesions of oral cavity/oropharynx; no palatal numbness No active epistaxis; no epiphora No obviously palpable neck masses/lymphadenopathy/thyromegaly No respiratory distress or stridor; TFL was indicated to better evaluate the proximal airway, given the patient's history and exam findings, and is detailed below.  Seprately Identifiable Procedures:  Prior to initiating any procedures, risks/benefits/alternatives were explained to the patient and verbal consent obtained.  PROCEDURE: Bilateral Diagnostic Rigid Nasal Endoscopy Pre-procedure diagnosis: Epistaxis, concern for left nasal mass Post-procedure  diagnosis: same Indication: See pre-procedure diagnosis and physical exam above Complications: None apparent EBL: 0 mL Anesthesia: Lidocaine  4% and topical decongestant was topically sprayed in each nasal cavity  Description of Procedure:  Patient was identified. A rigid 30 degree endoscope was utilized to evaluate the sinonasal cavities, mucosa, sinus ostia and turbinates and septum.  Overall, signs of mucosal inflammation are mild but noted. No prominent vessels; over left middle turbinate and associated with left middle turbinate/surrounding it, is a polypoid mass. Unable to determine full extent of this and it is not friable, no significant crusting around it.  No mucopurulence, polyps, or masses noted.   Right Middle meatus: clear Right SE Recess: clear Left MM: obstructed somewhat by mass Left SE Recess: clear     Photodocumentation was obtained.  CPT CODE -- 40981 - Mod 25  Procedure Note Pre-procedure diagnosis:  Hemoptysis Post-procedure diagnosis: Same Procedure: Transnasal Fiberoptic Laryngoscopy, CPT 31575 - Mod 25 Indication: see above Complications: None apparent EBL: 0 mL  The procedure was undertaken to further evaluate the patient's complaint above; with mirror exam inadequate for appropriate examination due to gag reflex and poor patient tolerance  Procedure:  Patient was identified as correct patient. Verbal consent was obtained. The nose was sprayed with oxymetazoline and 4% lidocaine . The The flexible laryngoscope was passed through the nose to view the nasal cavity, pharynx (oropharynx, hypopharynx) and larynx.  The larynx was examined at rest and during multiple phonatory tasks. Documentation was obtained and reviewed with patient. The scope was removed. The patient tolerated the procedure well.  Findings: The nasal cavity and nasopharynx did not reveal any masses or lesions, mucosa appeared to be without obvious lesions. The tongue base, pharyngeal walls,  piriform sinuses, vallecula, epiglottis and postcricoid region are normal in appearance. The visualized portion of the subglottis and proximal trachea is widely patent. The vocal folds are mobile bilaterally. There are no lesions on the free edge of the vocal folds nor elsewhere in the larynx worrisome for malignancy.  No obvious proximal airway lesion to explain bleeding    Electronically signed by: Evelina Hippo, MD 07/27/2023 9:02 AM    Impression & Plans:  Sammy Tannen is a 78 y.o. male on ASA 81 with:  1. Nasal mass   2. Epistaxis   3. Hemoptysis   4. PND (post-nasal drip)    Unclear if this is truly epistaxis and hemoptysis v/s just bloody crusting he reports blowing out "chunks" and unclear per his reports if  morning episodes are blood or not. Not occurring any other time. TFL is reassuring but on nasal endoscopy, he does have a left nasal cavity lesion which appears to have been present at least since 2020 - could be cause of his nasal symptoms.  We discussed benign v/s malignant etiologies for this, and explained he needs further workup for this.   - CT Face without contrast (will avoid contrast given kidney dysfunction) - MRI Face WITH contrast - Augmentin BID x7d in case of any infection - Will add astelin BID for PND  - f/u in 4 weeks with imaging; depending on extent, he may need biopsy and resection for this for diagnosis and treatment. D/w pt re: pulm referral but will hold off for now given there is a potential cause discovered for his symptoms  See below regarding exact medications prescribed this encounter including dosages and route: Meds ordered this encounter  Medications   amoxicillin -clavulanate (AUGMENTIN) 875-125 MG tablet    Sig: Take 1 tablet by mouth 2 (two) times daily for 7 days.    Dispense:  14 tablet    Refill:  0   azelastine (ASTELIN) 0.1 % nasal spray    Sig: Place 2 sprays into both nostrils 2 (two) times daily. Use in each nostril as  directed    Dispense:  30 mL    Refill:  12      Thank you for allowing me the opportunity to care for your patient. Please do not hesitate to contact me should you have any other questions.  Sincerely, Milon Aloe, MD Otolaryngologist (ENT), [facility] Phone: [phone] Fax: [phone]  07/27/2023, 9:02 AM   MDM:  Level 4 Complexity/Problems addressed: mod - new problem, unknown diagnosis and prognosis needing further workup; chronic issue as well Data complexity: high - independent review of notes, labs, tests; independent imaging interpretation - Morbidity: moderate - Prescription Drug prescribed or managed: yes

## 2023-07-26 NOTE — Patient Instructions (Addendum)
 Take Augmentin 875 mg by mouth (PO) twice daily for 7 days; take with food, take probiotic or yogurt with it Use astelin nasal spray two sprays each nostril twice per day I have ordered an imaging study for you to complete prior to your next visit. Please call [facility] Scheduling at [phone] to schedule your imaging if you have not received a call within 24 hours. If you are unable to complete your imaging study prior to your next scheduled visit please call our office to let us  know.

## 2023-07-29 DIAGNOSIS — H40013 Open angle with borderline findings, low risk, bilateral: Secondary | ICD-10-CM | POA: Diagnosis not present

## 2023-07-29 DIAGNOSIS — H04123 Dry eye syndrome of bilateral lacrimal glands: Secondary | ICD-10-CM | POA: Diagnosis not present

## 2023-08-07 ENCOUNTER — Ambulatory Visit (HOSPITAL_COMMUNITY)

## 2023-08-13 ENCOUNTER — Ambulatory Visit (HOSPITAL_COMMUNITY)
Admission: RE | Admit: 2023-08-13 | Discharge: 2023-08-13 | Disposition: A | Discharge status: Home / Self Care | Source: Ambulatory Visit | Attending: Otolaryngology | Admitting: Otolaryngology

## 2023-08-13 DIAGNOSIS — R040 Epistaxis: Secondary | ICD-10-CM | POA: Diagnosis not present

## 2023-08-13 DIAGNOSIS — J3489 Other specified disorders of nose and nasal sinuses: Secondary | ICD-10-CM | POA: Diagnosis not present

## 2023-08-13 MED ORDER — GADOBUTROL 1 MMOL/ML IV SOLN
10.0000 mL | Freq: Once | INTRAVENOUS | Status: AC | PRN
  Administered 2023-08-13: 10 mL via INTRAVENOUS

## 2023-09-02 ENCOUNTER — Ambulatory Visit (INDEPENDENT_AMBULATORY_CARE_PROVIDER_SITE_OTHER): Admitting: Otolaryngology

## 2023-09-02 ENCOUNTER — Encounter (INDEPENDENT_AMBULATORY_CARE_PROVIDER_SITE_OTHER): Payer: Self-pay | Admitting: Otolaryngology

## 2023-09-02 VITALS — BP 142/76 | HR 81

## 2023-09-02 DIAGNOSIS — J328 Other chronic sinusitis: Secondary | ICD-10-CM

## 2023-09-02 DIAGNOSIS — J3489 Other specified disorders of nose and nasal sinuses: Secondary | ICD-10-CM | POA: Diagnosis not present

## 2023-09-02 DIAGNOSIS — R040 Epistaxis: Secondary | ICD-10-CM

## 2023-09-02 NOTE — Patient Instructions (Signed)
 Use two sprays of flonase  in each nostril twice per day  Andres Bangs Med Nasal Saline Rinse - use daily; stop 3 days before CT scan - start nasal saline rinses with [employer] Bottle available over the counter    Nasal Saline Irrigation instructions: If you choose to make your own salt water solution, You will need: Salt (kosher, canning, or pickling salt) Baking soda Nasal irrigation bottle (i.e. Andres Bangs Med Sinus Rinse) Measuring spoon ( teaspoon) Distilled / boiled water   Mix solution Mix 1 teaspoon of salt, 1/2 teaspoon of baking soda and 1 cup of water into irrigation bottle ** May use saline packet instead of homemade recipe for this step if you prefer If medicine was prescribed to be mixed with solution, place this into bottle Examples 2 inches of 2% mupirocin ointment Budesonide solution Position your head: Lean over sink (about 45 degrees) Rotate head (about 45 degrees) so that one nostril is above the other Irrigate Insert tip of irrigation bottle into upper nostril so it forms a comfortable seal Irrigate while breathing through your mouth May remove the straw from the bottle in order to irrigate the entire solution (important if medicine was added) Exhale through nose when finished and blow nose as necessary  Repeat on opposite side with other 1/2 of solution (120 mL) or remake solution if all 240 mL was used on first side Wash irrigation bottle regularly, replace every 3 months

## 2023-09-02 NOTE — Progress Notes (Signed)
 Dear Dr. Clarice, Here is my assessment for our mutual patient, Jorge [name]. Thank you for allowing me the opportunity to care for your patient. Please do not hesitate to contact me should you have any other questions. Sincerely, Dr. Eldora Blanch  Otolaryngology Clinic Note Referring provider: Dr. Clarice HPI:  Jorge [name] is a 78 y.o. male kindly referred by Dr. Clarice for evaluation of epistaxis and hemoptysis(?)  Initial visit (07/2023): Noted epistaxis he reports ongoing for about 2 years, worse after flonase  use. Mostly reports that he does not have any active nose bleeds but will blow dark red chunks first thing in morning. Always on left. Denies any problems with pain, facial numbness, discolored drainage, congestion, epiphora or neck masses or weight loss.  Tried mupirocin, did not work. Not using flonase  currently. Some PND.  Additionally, he also reports some red blood mixed with mucus intermittently first thing in the morning when he coughs vigorously. He has not had any other pulm sx including cough, congestion, SOB. No smoking history. No swallowing difficulty, ear pain. Has never seen pulmonologist.  He denies any AR symptoms. No prior nasal procedures. No liver dysfunction, some kidney dysfunction.   --------------------------------------------------------- 09/02/2023 No epistaxis since. He denies any sinonasal symptoms currently except for some left nasal pressure and some morning time nasal congestion. No rinse use. Denies any problems with pain, facial numbness, discolored drainage, epiphora or neck masses or weight loss.  We discussed his CT and MRI results.   H&N Surgery: Septoplasty (>40 years ago) Personal or FHx of bleeding dz or anesthesia difficulty: no  GLP-1: no AP/AC: ASA 81  Tobacco: denies. Alcohol: no significant use. PMHx: HLD, CAD, HTN, RA, DVT, Ablation for PVC, CKD  Independent Review of Additional Tests or Records:  Dr. Clarice (IM) referral  notes Referral notes reviewed and uploaded or available in chart in media tab (04/04/2023): Noted epistaxis - left chunks of red from the nose since starting flonase ; no pain, no fevers; Rx: Mupirocin, flonase , Ref to ENT Labs in media tab (03/29/2023): CBC and BMP: WBC 5.8, Hgb 15.7, Plt 225; BUNCr 18/1.1 MRI Brain 04/23/2018 independently interpreted with respect to sinuses: left middle turbinate associated lesion but cuts thick so cannot definitively say; no obvious intracranial or orbit extension appreciated; no significant paranasal sinus disease noted otherwise on left or right   CT Sinus and MR Face 08/13/2023: read pending but on independent review appears to have left ethmoid opacification with FSOT narrowing; no erosion noted - mucocele? No obvious encephalocele but there is a 1x1 cm enhancing lesion which obstructs FSOT.  PMH/Meds/All/SocHx/FamHx/ROS:   Past Medical History:  Diagnosis Date   Arthritis    CAD (coronary artery disease)    PTCA diagonal 2001, residual 60% LAD /  Nuclear 2006, no ischemia   Carotid artery disease (HCC)    Doppler, October, 2012, 0-39% bilateral stenoses.   Diverticulitis    Dyslipidemia    Ejection fraction    EF normal,   Eye abnormalities 08-23-2011   right ryr injection for bleed done by dr elner, dr elner told pt ok to have surgery 09-03-2011   Family history of anesthesia complication    SISTER HAD MEMORY PROBLEMS POST OP   Nuclear sclerotic cataract of left eye 09/23/2019   The nature of cataract was discussed with the patient as well as the elective nature of surgery. The patient was reassured that surgery at a later date does not put the patient at risk for a worse outcome. It  was emphasized that the need for surgery is dictated by the patient's quality of life as influenced by the cataract. Patient was instructed to maintain close follow up with their general eye    Preop cardiovascular exam    Patient needs cardiac clearance for hernia surgery  May, 2013   Psoriatic arthritis (HCC)    2012   PVC's (premature ventricular contractions)    Frequent PVCs noted over the years., These always decrease with exercise. This is documented   Umbilical hernia 07/31/2011     Past Surgical History:  Procedure Laterality Date   ABLATION  06/09/2019   ANGIOPLASTY  12/1999 and 03/2000   CARDIAC CATHETERIZATION  2001   carpel tunnel  2009/2010   both wrists   COLONOSCOPY N/A 10/30/2013   Procedure: COLONOSCOPY;  Surgeon: Belvie JONETTA Just, MD;  Location: [facility] ENDOSCOPY;  Service: Endoscopy;  Laterality: N/A;   ESOPHAGOGASTRODUODENOSCOPY N/A 10/30/2013   Procedure: ESOPHAGOGASTRODUODENOSCOPY (EGD);  Surgeon: Belvie JONETTA Just, MD;  Location[facility] ENDOSCOPY;  Service: Endoscopy;  Laterality: N/A;   INGUINAL HERNIA REPAIR  09/03/2011   Procedure: HERNIA REPAIR INGUINAL ADULT;  Surgeon: Donnice POUR. Belinda, MD;  Location: [facility] ORS;  Service: General;  Laterality: Left;  Left Ingunal Hernia Repair with Mesh   PVC ABLATION N/A 06/09/2019   Procedure: PVC ABLATION;  Surgeon: Waddell Danelle ORN, MD;  Location: [facility] INVASIVE CV LAB;  Service: Cardiovascular;  Laterality: N/A;   right knee meniscus repair  1990's   UMBILICAL HERNIA REPAIR  09/03/2011   Procedure: HERNIA REPAIR UMBILICAL ADULT;  Surgeon: Donnice POUR. Belinda, MD;  Location: [facility] ORS;  Service: General;  Laterality: N/A;    Family History  Problem Relation Age of Onset   Cancer Mother        uterine   Cancer Sister        Breast   Crohn's disease Cousin        distant   Colon cancer Neg Hx    Ulcerative colitis Neg Hx      Social Connections: Not on file      Current Outpatient Medications:    aspirin  EC 81 MG tablet, Take 81 mg by mouth daily., Disp: , Rfl:    atorvastatin  (LIPITOR) 80 MG tablet, Take 40 mg by mouth daily. , Disp: , Rfl:    azelastine  (ASTELIN ) 0.1 % nasal spray, Place 2 sprays into both nostrils 2 (two) times daily. Use in each nostril as directed, Disp: 30 mL, Rfl: 12   ezetimibe   (ZETIA ) 10 MG tablet, Take 10 mg by mouth daily with breakfast., Disp: , Rfl:    fish oil-omega-3 fatty acids  1000 MG capsule, Take 2 g by mouth daily with breakfast. , Disp: , Rfl:    metoprolol  succinate (TOPROL -XL) 25 MG 24 hr tablet, TAKE 1/2 TABLET BY MOUTH EVERY DAY WITH BREAKFAST, Disp: 45 tablet, Rfl: 3   Multiple Vitamin (MULTIVITAMIN) capsule, Take 1 capsule by mouth daily.  , Disp: , Rfl:    MYRBETRIQ  50 MG TB24 tablet, Take 50 mg by mouth daily., Disp: , Rfl:    pantoprazole  (PROTONIX ) 40 MG tablet, Take 1 tablet (40 mg total) by mouth daily., Disp: 30 tablet, Rfl: 1   promethazine -dextromethorphan (PROMETHAZINE -DM) 6.25-15 MG/5ML syrup, Take 5 mLs by mouth 4 (four) times daily as needed., Disp: 118 mL, Rfl: 0   Tofacitinib  Citrate 5 MG TABS, Take 5 mg by mouth daily., Disp: , Rfl:    amoxicillin  (AMOXIL ) 500 MG capsule, Take 1 capsule by mouth  every 6 (six) hours. (Patient not taking: Reported on 09/02/2023), Disp: , Rfl:    fluticasone  (FLONASE ) 50 MCG/ACT nasal spray, Place 2 sprays into both nostrils daily., Disp: 16 g, Rfl: 0   oseltamivir  (TAMIFLU ) 75 MG capsule, Take 1 capsule (75 mg total) by mouth every 12 (twelve) hours., Disp: 10 capsule, Rfl: 0   Physical Exam:   BP (!) 142/76   Pulse 81   SpO2 92%   Salient findings:  CN II-XII intact  Bilateral EAC clear and TM intact with well pneumatized middle ear spaces Anterior rhinoscopy: Septum relatively midline, no prominent vessels over septum; bilateral inferior turbinates without significant hypertrophy; Nasal endoscopy was indicated to better evaluate the nose and paranasal sinuses, given the patient's history and exam findings, and is detailed below. No lesions of oral cavity/oropharynx; no palatal numbness No active epistaxis; no epiphora No obviously palpable neck masses/lymphadenopathy/thyromegaly No respiratory distress or stridor  Seprately Identifiable Procedures:  Prior to initiating any procedures,  risks/benefits/alternatives were explained to the patient and verbal consent obtained.  PROCEDURE: Bilateral Diagnostic Rigid Nasal Endoscopy Pre-procedure diagnosis: Epistaxis, concern for left nasal mass, left chronic sinusitis Post-procedure diagnosis: same Indication: See pre-procedure diagnosis and physical exam above Complications: None apparent EBL: 0 mL Anesthesia: Lidocaine  4% and topical decongestant was topically sprayed in each nasal cavity  Description of Procedure:  Patient was identified. A rigid 30 degree endoscope was utilized to evaluate the sinonasal cavities, mucosa, sinus ostia and turbinates and septum.  Overall, signs of mucosal inflammation are mild but noted - but the left nasal nodule/mass appears to be smaller in size without exposed bone or CSF. No prominent vessels Unable to determine full extent of this and it is not friable, no significant crusting around it.  No mucopurulence, polyps, or masses noted.   Right Middle meatus: clear Right SE Recess: clear Left MM: obstructed somewhat by mass Left SE Recess: clear    Photodocumentation was obtained.    CPT CODE -- 68768 - Mod 25  Impression & Plans:  Acey Woodfield is a 78 y.o. male on ASA 81 with:  1. Nasal mass   2. Epistaxis   3. Other chronic sinusitis    Unclear if this is truly epistaxis v/s just bloody crusting he reports blowing out chunks and unclear per his reports if morning episodes are blood or not. Epistaxis resolved and mass appears to be slightly less prominent. Given comparison, do think this is more inflammatory in nature and he reports that he does have left congestion and pressure which is why he blows his nose so frequently, which may be precipitating the epistaxis.  We discussed benign v/s malignant etiologies for this, and next steps: bx, FESS/excision, observation.  Would not rec observation. He wishes for FESS but reports he would like to follow up in a few weeks to schedule  this in the fall (works in the garden and reports currently not a good time)  - Return precautions discussed - Rec Flonase  and astelin  BID - Rec Daily sinus rinse - f/u in ~6 weeks.  See below regarding exact medications prescribed this encounter including dosages and route: No orders of the defined types were placed in this encounter.     Thank you for allowing me the opportunity to care for your patient. Please do not hesitate to contact me should you have any other questions.  Sincerely, Eldora Blanch, MD Otolaryngologist (ENT), [facility] Phone: [phone] Fax: [phone]  09/14/2023, 5:24 PM   I  have personally spent 41 minutes involved in face-to-face and non-face-to-face activities for this patient on the day of the visit.  Professional time spent excludes any procedures performed but includes the following activities, in addition to those noted in the documentation: preparing to see the patient (review of outside documentation and results), performing a medically appropriate examination, extensive counseling, documenting in the electronic health record, independently interpreting results (CT, MRI).

## 2023-09-24 ENCOUNTER — Telehealth (INDEPENDENT_AMBULATORY_CARE_PROVIDER_SITE_OTHER): Payer: Self-pay

## 2023-09-24 MED ORDER — FLUTICASONE PROPIONATE 50 MCG/ACT NA SUSP: 2.0000 | Freq: Two times a day (BID) | NASAL | 10 refills | Status: AC

## 2023-09-24 NOTE — Telephone Encounter (Signed)
 Daughter left a message about his flonase , she stated that it was never sent to the pharmacy

## 2023-09-24 NOTE — Addendum Note (Signed)
 Addended by: Ramar Nobrega on: 09/24/2023 04:07 PM   Modules accepted: Orders

## 2023-09-24 NOTE — Telephone Encounter (Signed)
 Looks it wsa done, I just resent to [facility] in [place]

## 2023-09-25 DIAGNOSIS — K080 Exfoliation of teeth due to systemic causes: Secondary | ICD-10-CM | POA: Diagnosis not present

## 2023-10-07 NOTE — Progress Notes (Shared)
 [facility] - Clinic Note  10/08/2023     CHIEF COMPLAINT Patient presents for No chief complaint on file.   HISTORY OF PRESENT ILLNESS: Jorge [name] is a 78 y.o. male who presents to the clinic today for:     Patient states his vision is about the same, he feels like he can see better out of the right eye than he can the left   Referring physician: Clarice Nottingham, MD [address] [place],  [place] [address]  HISTORICAL INFORMATION:   Selected notes from the MEDICAL RECORD NUMBER Pt transferring care from Dr. 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 (Ophthalmic Drugs)   No current facility-administered medications for this visit. (Ophthalmic Drugs)   Current Outpatient Medications (Other)  Medication Sig   amoxicillin  (AMOXIL ) 500 MG capsule Take 1 capsule by mouth every 6 (six) hours. (Patient not taking: Reported on 09/02/2023)   aspirin  EC 81 MG tablet Take 81 mg by mouth daily.   atorvastatin  (LIPITOR) 80 MG tablet Take 40 mg by mouth daily.    azelastine  (ASTELIN ) 0.1 % nasal spray Place 2 sprays into both nostrils 2 (two) times daily. Use in each nostril as directed   ezetimibe  (ZETIA ) 10 MG tablet Take 10 mg by mouth daily with breakfast.   fish oil-omega-3 fatty acids  1000 MG capsule Take 2 g by mouth daily with breakfast.    fluticasone  (FLONASE ) 50 MCG/ACT nasal spray Place 2 sprays into both nostrils in the morning and at bedtime.   metoprolol  succinate (TOPROL -XL) 25 MG 24 hr tablet TAKE 1/2 TABLET BY MOUTH EVERY DAY WITH BREAKFAST   Multiple Vitamin (MULTIVITAMIN) capsule Take 1 capsule by mouth daily.     MYRBETRIQ  50 MG TB24 tablet Take 50 mg by mouth daily.   oseltamivir  (TAMIFLU ) 75 MG capsule Take 1 capsule (75 mg total) by mouth every 12  (twelve) hours.   pantoprazole  (PROTONIX ) 40 MG tablet Take 1 tablet (40 mg total) by mouth daily.   promethazine -dextromethorphan (PROMETHAZINE -DM) 6.25-15 MG/5ML syrup Take 5 mLs by mouth 4 (four) times daily as needed.   Tofacitinib  Citrate 5 MG TABS Take 5 mg by mouth daily.   No current facility-administered medications for this visit. (Other)   REVIEW OF SYSTEMS:    ALLERGIES Allergies  Allergen Reactions   Niacin Itching and Swelling   PAST MEDICAL HISTORY Past Medical History:  Diagnosis Date   Arthritis    CAD (coronary artery disease)    PTCA diagonal 2001, residual 60% LAD /  Nuclear 2006, no ischemia   Carotid artery disease (HCC)    Doppler, October, 2012, 0-39% bilateral stenoses.   Diverticulitis    Dyslipidemia    Ejection fraction    EF normal,   Eye abnormalities 08-23-2011   right ryr injection for bleed done by dr elner, dr elner told pt ok to have surgery 09-03-2011   Family history of anesthesia complication    SISTER HAD MEMORY PROBLEMS POST OP   Nuclear sclerotic cataract of left eye 09/23/2019   The nature of cataract was discussed with the patient as well as the elective nature of surgery. The patient was reassured that surgery at a later date does not put the patient at risk for a worse outcome. It was emphasized  that the need for surgery is dictated by the patient's quality of life as influenced by the cataract. Patient was instructed to maintain close follow up with their general eye    Preop cardiovascular exam    Patient needs cardiac clearance for hernia surgery May, 2013   Psoriatic arthritis (HCC)    2012   PVC's (premature ventricular contractions)    Frequent PVCs noted over the years., These always decrease with exercise. This is documented   Umbilical hernia 07/31/2011   Past Surgical History:  Procedure Laterality Date   ABLATION  06/09/2019   ANGIOPLASTY  12/1999 and 03/2000   CARDIAC CATHETERIZATION  2001   carpel tunnel  2009/2010    both wrists   COLONOSCOPY N/A 10/30/2013   Procedure: COLONOSCOPY;  Surgeon: Belvie JONETTA Just, MD;  Location: [facility] ENDOSCOPY;  Service: Endoscopy;  Laterality: N/A;   ESOPHAGOGASTRODUODENOSCOPY N/A 10/30/2013   Procedure: ESOPHAGOGASTRODUODENOSCOPY (EGD);  Surgeon: Belvie JONETTA Just, MD;  Location: [facility] ENDOSCOPY;  Service: Endoscopy;  Laterality: N/A;   INGUINAL HERNIA REPAIR  09/03/2011   Procedure: HERNIA REPAIR INGUINAL ADULT;  Surgeon: Donnice POUR. Belinda, MD;  Location: [facility] ORS;  Service: General;  Laterality: Left;  Left Ingunal Hernia Repair with Mesh   PVC ABLATION N/A 06/09/2019   Procedure: PVC ABLATION;  Surgeon: Waddell Danelle ORN, MD;  Location: [facility] INVASIVE CV LAB;  Service: Cardiovascular;  Laterality: N/A;   right knee meniscus repair  1990's   UMBILICAL HERNIA REPAIR  09/03/2011   Procedure: HERNIA REPAIR UMBILICAL ADULT;  Surgeon: Donnice POUR. Belinda, MD;  Location: [facility] ORS;  Service: General;  Laterality: N/A;   FAMILY HISTORY Family History  Problem Relation Age of Onset   Cancer Mother        uterine   Cancer Sister        Breast   Crohn's disease Cousin        distant   Colon cancer Neg Hx    Ulcerative colitis Neg Hx    SOCIAL HISTORY Social History   Tobacco Use   Smoking status: Former    Current packs/day: 0.00    Average packs/day: 1.5 packs/day for 25.0 years (37.5 ttl pk-yrs)    Types: Cigarettes    Start date: 12/07/1969    Quit date: 12/08/1994    Years since quitting: 28.8   Smokeless tobacco: Former  Building services engineer status: Never Used  Substance Use Topics   Alcohol use: Yes    Comment: 2 drink per day   Drug use: No       OPHTHALMIC EXAM:  Not recorded    IMAGING AND PROCEDURES  Imaging and Procedures for 10/08/2023           ASSESSMENT/PLAN:   ICD-10-CM   1. Central retinal vein occlusion with macular edema of right eye  H34.8110     2. Essential hypertension  I10     3. Hypertensive retinopathy of both eyes  H35.033     4. Pseudophakia,  both eyes  Z96.1       CRVO w/ CME, OD  - Dr. Elner pt here due to insurance reasons  - onset of CRVO ~8 yrs ago per pt report (~2015-2016)  - history of multiple IVA OD -- on 14 wk maintenance schedule w/ [facility]  - last injxn w [facility] -- IVA OD on 09.12.23 (18 wks prior to 1st visit/injxn here) - s/p IVA OD #1 (01.18.24), #2 (04.11.24), #3 (05.23.24), #4 (07.18.24), #5 (09.05.24), #6 (10.24.24), #  7 (12.12.24), #8 (01.18.25), #9 (05.06.25)  **increase in IRF at 8 wks on 07.18.24 visit**  - BCVA OD 20/20 from 20/25  - OCT shows tr scattered cystic changes at 11 wks  - recommend IVA OD #10 today, 07.22.25 w/ f/u in 11 wks  - pt wishes to proceed with injection  - RBA of procedure discussed, questions answered - informed consent obtained and signed 01.18.24  - see procedure note - Eylea approved for 2024 - f/u 11 wks -- DFE/OCT, possible injection  2,3. Hypertensive retinopathy OU - discussed importance of tight BP control - monitor  4. Pseudophakia OU  - s/p CE/IOL OU (Dr. Lelon)  - IOL in good position, doing well  - monitor  Ophthalmic Meds Ordered this visit:  No orders of the defined types were placed in this encounter.    No follow-ups on file.  There are no Patient Instructions on file for this visit.   Explained the diagnoses, plan, and follow up with the patient and they expressed understanding.  Patient expressed understanding of the importance of proper follow up care.   This document serves as a record of services personally performed by Redell JUDITHANN Hans, MD, PhD. It was created on their behalf by Auston Muzzy, COMT. The creation of this record is the provider's dictation and/or activities during the visit.  Electronically signed by: Auston Muzzy, COMT 10/07/23 10:20 AM   Redell JUDITHANN Hans, M.D., Ph.D. Diseases & Surgery of the Retina and Vitreous [facility]   Abbreviations: M myopia (nearsighted); A astigmatism; H hyperopia  (farsighted); P presbyopia; Mrx spectacle prescription;  CTL contact lenses; OD right eye; OS left eye; OU both eyes  XT exotropia; ET esotropia; PEK punctate epithelial keratitis; PEE punctate epithelial erosions; DES dry eye syndrome; MGD meibomian gland dysfunction; ATs artificial tears; PFAT's preservative free artificial tears; NSC nuclear sclerotic cataract; PSC posterior subcapsular cataract; ERM epi-retinal membrane; PVD posterior vitreous detachment; RD retinal detachment; DM diabetes mellitus; DR diabetic retinopathy; NPDR non-proliferative diabetic retinopathy; PDR proliferative diabetic retinopathy; CSME clinically significant macular edema; DME diabetic macular edema; dbh dot blot hemorrhages; CWS cotton wool spot; POAG primary open angle glaucoma; C/D cup-to-disc ratio; HVF humphrey visual field; GVF goldmann visual field; OCT optical coherence tomography; IOP intraocular pressure; BRVO Branch retinal vein occlusion; CRVO central retinal vein occlusion; CRAO central retinal artery occlusion; BRAO branch retinal artery occlusion; RT retinal tear; SB scleral buckle; PPV pars plana vitrectomy; VH Vitreous hemorrhage; PRP panretinal laser photocoagulation; IVK intravitreal kenalog; VMT vitreomacular traction; MH Macular hole;  NVD neovascularization of the disc; NVE neovascularization elsewhere; AREDS age related eye disease study; ARMD age related macular degeneration; POAG primary open angle glaucoma; EBMD epithelial/anterior basement membrane dystrophy; ACIOL anterior chamber intraocular lens; IOL intraocular lens; PCIOL posterior chamber intraocular lens; Phaco/IOL phacoemulsification with intraocular lens placement; PRK photorefractive keratectomy; LASIK laser assisted in situ keratomileusis; HTN hypertension; DM diabetes mellitus; COPD chronic obstructive pulmonary disease

## 2023-10-08 ENCOUNTER — Encounter (INDEPENDENT_AMBULATORY_CARE_PROVIDER_SITE_OTHER): Admitting: Ophthalmology

## 2023-10-16 ENCOUNTER — Ambulatory Visit (INDEPENDENT_AMBULATORY_CARE_PROVIDER_SITE_OTHER): Admitting: Ophthalmology

## 2023-10-16 ENCOUNTER — Encounter (INDEPENDENT_AMBULATORY_CARE_PROVIDER_SITE_OTHER): Payer: Self-pay | Admitting: Ophthalmology

## 2023-10-16 DIAGNOSIS — H35033 Hypertensive retinopathy, bilateral: Secondary | ICD-10-CM | POA: Diagnosis not present

## 2023-10-16 DIAGNOSIS — H43822 Vitreomacular adhesion, left eye: Secondary | ICD-10-CM

## 2023-10-16 DIAGNOSIS — H35371 Puckering of macula, right eye: Secondary | ICD-10-CM

## 2023-10-16 DIAGNOSIS — H35351 Cystoid macular degeneration, right eye: Secondary | ICD-10-CM

## 2023-10-16 DIAGNOSIS — H34811 Central retinal vein occlusion, right eye, with macular edema: Secondary | ICD-10-CM

## 2023-10-16 DIAGNOSIS — Z961 Presence of intraocular lens: Secondary | ICD-10-CM

## 2023-10-16 DIAGNOSIS — I10 Essential (primary) hypertension: Secondary | ICD-10-CM

## 2023-10-16 MED ORDER — BEVACIZUMAB CHEMO INJECTION 1.25MG/0.05ML SYRINGE FOR KALEIDOSCOPE
1.2500 mg | INTRAVITREAL | Status: AC | PRN
  Administered 2023-10-16: 1.25 mg via INTRAVITREAL

## 2023-10-16 NOTE — Progress Notes (Signed)
 [facility] - Clinic Note  10/16/2023     CHIEF COMPLAINT Patient presents for Retina Follow Up   HISTORY OF PRESENT ILLNESS: Jorge [name] is a 78 y.o. male who presents to the clinic today for:   HPI     Retina Follow Up   Patient presents with  CRVO/BRVO.  In right eye.  This started 11 weeks ago.  I, the attending physician,  performed the HPI with the patient and updated documentation appropriately.        Comments   Patient here for 11 weeks retina follow up fro CRVO OD. Patient states vision about the same. No eye pain.      Last edited by Valdemar Rogue, MD on 10/16/2023  7:28 PM.      Patient states his vision is about the same, he feels like he can see better out of the right eye than he can the left   Referring physician: Clarice Nottingham, MD [address] [place],  [place] [address]  HISTORICAL INFORMATION:   Selected notes from the MEDICAL RECORD NUMBER Pt transferring care from Dr. 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 (Ophthalmic Drugs)   No current facility-administered medications for this visit. (Ophthalmic Drugs)   Current Outpatient Medications (Other)  Medication Sig   aspirin  EC 81 MG tablet Take 81 mg by mouth daily.   atorvastatin  (LIPITOR) 80 MG tablet Take 40 mg by mouth daily.    azelastine  (ASTELIN ) 0.1 % nasal spray Place 2 sprays into both nostrils 2 (two) times daily. Use in each nostril as directed   ezetimibe  (ZETIA ) 10 MG tablet Take 10 mg by mouth daily with breakfast.   fish oil-omega-3 fatty acids  1000 MG capsule Take 2 g by mouth daily with breakfast.    fluticasone  (FLONASE ) 50 MCG/ACT nasal spray Place 2 sprays into both nostrils in the morning and at bedtime.   metoprolol  succinate (TOPROL -XL) 25 MG 24 hr tablet  TAKE 1/2 TABLET BY MOUTH EVERY DAY WITH BREAKFAST   Multiple Vitamin (MULTIVITAMIN) capsule Take 1 capsule by mouth daily.     MYRBETRIQ  50 MG TB24 tablet Take 50 mg by mouth daily.   pantoprazole  (PROTONIX ) 40 MG tablet Take 1 tablet (40 mg total) by mouth daily.   promethazine -dextromethorphan (PROMETHAZINE -DM) 6.25-15 MG/5ML syrup Take 5 mLs by mouth 4 (four) times daily as needed.   Tofacitinib  Citrate 5 MG TABS Take 5 mg by mouth daily.   amoxicillin  (AMOXIL ) 500 MG capsule Take 1 capsule by mouth every 6 (six) hours. (Patient not taking: Reported on 10/16/2023)   oseltamivir  (TAMIFLU ) 75 MG capsule Take 1 capsule (75 mg total) by mouth every 12 (twelve) hours.   No current facility-administered medications for this visit. (Other)   REVIEW OF SYSTEMS: ROS   Positive for: Gastrointestinal, Cardiovascular, Eyes Negative for: Constitutional, Neurological, Skin, Genitourinary, Musculoskeletal, HENT, Endocrine, Respiratory, Psychiatric, Allergic/Imm, Heme/Lymph Last edited by Orval Asberry RAMAN, COA on 10/16/2023  8:52 AM.       ALLERGIES Allergies  Allergen Reactions   Niacin Itching and Swelling   PAST MEDICAL HISTORY Past Medical History:  Diagnosis Date   Arthritis    CAD (coronary artery disease)    PTCA diagonal 2001, residual 60% LAD /  Nuclear 2006, no ischemia  Carotid artery disease (HCC)    Doppler, October, 2012, 0-39% bilateral stenoses.   Diverticulitis    Dyslipidemia    Ejection fraction    EF normal,   Eye abnormalities 08-23-2011   right ryr injection for bleed done by dr elner, dr elner told pt ok to have surgery 09-03-2011   Family history of anesthesia complication    SISTER HAD MEMORY PROBLEMS POST OP   Nuclear sclerotic cataract of left eye 09/23/2019   The nature of cataract was discussed with the patient as well as the elective nature of surgery. The patient was reassured that surgery at a later date does not put the patient at risk for a worse outcome.  It was emphasized that the need for surgery is dictated by the patient's quality of life as influenced by the cataract. Patient was instructed to maintain close follow up with their general eye    Preop cardiovascular exam    Patient needs cardiac clearance for hernia surgery May, 2013   Psoriatic arthritis (HCC)    2012   PVC's (premature ventricular contractions)    Frequent PVCs noted over the years., These always decrease with exercise. This is documented   Umbilical hernia 07/31/2011   Past Surgical History:  Procedure Laterality Date   ABLATION  06/09/2019   ANGIOPLASTY  12/1999 and 03/2000   CARDIAC CATHETERIZATION  2001   carpel tunnel  2009/2010   both wrists   COLONOSCOPY N/A 10/30/2013   Procedure: COLONOSCOPY;  Surgeon: Belvie JONETTA Just, MD;  Location[facility] ENDOSCOPY;  Service: Endoscopy;  Laterality: N/A;   ESOPHAGOGASTRODUODENOSCOPY N/A 10/30/2013   Procedure: ESOPHAGOGASTRODUODENOSCOPY (EGD);  Surgeon: Belvie JONETTA Just, MD;  Location: [facility] ENDOSCOPY;  Service: Endoscopy;  Laterality: N/A;   INGUINAL HERNIA REPAIR  09/03/2011   Procedure: HERNIA REPAIR INGUINAL ADULT;  Surgeon: Donnice POUR. Belinda, MD;  Location: [facility] ORS;  Service: General;  Laterality: Left;  Left Ingunal Hernia Repair with Mesh   PVC ABLATION N/A 06/09/2019   Procedure: PVC ABLATION;  Surgeon: Waddell Danelle ORN, MD;  Location: [facility] INVASIVE CV LAB;  Service: Cardiovascular;  Laterality: N/A;   right knee meniscus repair  1990's   UMBILICAL HERNIA REPAIR  09/03/2011   Procedure: HERNIA REPAIR UMBILICAL ADULT;  Surgeon: Donnice POUR. Belinda, MD;  Location: [facility] ORS;  Service: General;  Laterality: N/A;   FAMILY HISTORY Family History  Problem Relation Age of Onset   Cancer Mother        uterine   Cancer Sister        Breast   Crohn's disease Cousin        distant   Colon cancer Neg Hx    Ulcerative colitis Neg Hx    SOCIAL HISTORY Social History   Tobacco Use   Smoking status: Former    Current packs/day: 0.00     Average packs/day: 1.5 packs/day for 25.0 years (37.5 ttl pk-yrs)    Types: Cigarettes    Start date: 12/07/1969    Quit date: 12/08/1994    Years since quitting: 28.8   Smokeless tobacco: Former  Building services engineer status: Never Used  Substance Use Topics   Alcohol use: Yes    Comment: 2 drink per day   Drug use: No       OPHTHALMIC EXAM:  Base Eye Exam     Visual Acuity (Snellen - Linear)       Right Left   Dist cc 20/25 -2 20/20   Dist ph cc  NI     Correction: Glasses         Tonometry (Tonopen, 8:50 AM)       Right Left   Pressure 16 14         Pupils       Dark Light Shape React APD   Right 3 2 Round Brisk None   Left 3 2 Round Brisk None         Visual Fields (Counting fingers)       Left Right    Full Full         Extraocular Movement       Right Left    Full, Ortho Full, Ortho         Neuro/Psych     Oriented x3: Yes   Mood/Affect: Normal         Dilation     Both eyes: 1.0% Mydriacyl, 2.5% Phenylephrine @ 8:50 AM           Slit Lamp and Fundus Exam     Slit Lamp Exam       Right Left   Lids/Lashes Dermatochalasis - upper lid Dermatochalasis - upper lid   Conjunctiva/Sclera nasal pingeucula Mild nasal and temporal pinguecula   Cornea arcus, well healed cataract wound, trace PEE, mild tear film debris, small sub epi scar at 0400 paracentral arcus, tear film debris, trace Punctate epithelial erosions, well healed cataract wound   Anterior Chamber deep and clear deep and clear   Iris Round and dilated Round and dilated   Lens PC IOL in good position PC IOL in good position   Anterior Vitreous Vitreous syneresis, +silicone micro bubbles Mild syeresis         Fundus Exam       Right Left   Posterior Vitreous Posterior vitreous detachment, vitreous condensations, Weiss ring Posterior vitreous detachment   Disc Pink and Sharp, vascular loops, patchy hyperemia, PPP temporally Pink and Sharp, mild PPA   C/D Ratio 0.2  0.3   Macula Flat, Blunted foveal reflex, trace cystic changes -- slightly increased, rare IRH Flat, Good foveal reflex, RPE mottling, No heme or edema   Vessels attenuated, Tortuous, old CRVO attenuated, Tortuous   Periphery Attached, No heme Attached, No heme           Refraction     Wearing Rx       Sphere Cylinder Axis Add   Right +0.00 +2.25 001 +2.00   Left -0.50 +2.25 165 +2.00           IMAGING AND PROCEDURES  Imaging and Procedures for 10/16/2023  OCT, Retina - OU - Both Eyes       Right Eye Quality was good. Scan locations included subfoveal. Central Foveal Thickness: 303. Progression has worsened. Findings include no SRF, abnormal foveal contour, intraretinal fluid (Interval increase in scattered cystic changes greatest nasal fovea).   Left Eye Quality was good. Scan locations included subfoveal. Central Foveal Thickness: 281. Progression has been stable. Findings include normal foveal contour, no IRF, no SRF.   Notes *Images captured and stored on drive  Diagnosis / Impression:  OD: CRVO - Interval increase in scattered cystic changes greatest nasal fovea OS: NFP, no IRF/SRF  Clinical management:  See below  Abbreviations: NFP - Normal foveal profile. CME - cystoid macular edema. PED - pigment epithelial detachment. IRF - intraretinal fluid. SRF - subretinal fluid. EZ - ellipsoid zone. ERM - epiretinal membrane. ORA - outer retinal atrophy. ORT - outer retinal  tubulation. SRHM - subretinal hyper-reflective material. IRHM - intraretinal hyper-reflective material        Intravitreal Injection, Pharmacologic Agent - OD - Right Eye       Time Out 10/16/2023. 9:17 AM. Confirmed correct patient, procedure, site, and patient consented.   Anesthesia Topical anesthesia was used. Anesthetic medications included Lidocaine  2%, Proparacaine 0.5%.   Procedure Preparation included 5% betadine to ocular surface, eyelid speculum. A supplied (32g) needle was  used.   Injection: 1.25 mg Bevacizumab  1.25mg /0.15ml   Route: Intravitreal, Site: Right Eye   NDC: H525437, Lot: [id number]$ , Expiration date: 11/10/2023   Post-op Post injection exam found visual acuity of at least counting fingers. The patient tolerated the procedure well. There were no complications. The patient received written and verbal post procedure care education.            ASSESSMENT/PLAN:   ICD-10-CM   1. Central retinal vein occlusion with macular edema of right eye  H34.8110 OCT, Retina - OU - Both Eyes    Intravitreal Injection, Pharmacologic Agent - OD - Right Eye    Bevacizumab  (AVASTIN ) SOLN 1.25 mg    2. Essential hypertension  I10     3. Hypertensive retinopathy of both eyes  H35.033     4. Pseudophakia, both eyes  Z96.1     5. Macular puckering, right eye  H35.371     6. 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 Cystoid macular edema of right eye  H35.351       CRVO w/ CME, OD  - Dr. Elner pt here due to insurance reasons  - onset of CRVO ~8 yrs ago per pt report (~2015-2016)  - history of multiple IVA OD -- on 14 wk maintenance schedule w/ [facility]  - last injxn w [facility] -- IVA OD on 09.12.23 (18 wks prior to 1st visit/injxn here) - s/p IVA OD #1 (01.18.24), #2 (04.11.24), #3 (05.23.24), #4 (07.18.24), #5 (09.05.24), #6 (10.24.24), #7 (12.12.24), #8 (01.18.25), #9 (05.06.25)  **increase in IRF at 8 wks on 07.18.24 visit**  **increase in IRF at 12 wks on 07.30.25 visit**  - BCVA OD 20/25 from 20/20  - OCT shows Interval increase in scattered cystic changes greatest nasal fovea at 12 wks  - recommend IVA OD #10 today, 07.30.25 w/ f/u back to 10-11 wks  - pt wishes to proceed with injection  - RBA of procedure discussed, questions answered - informed consent obtained and signed 01.18.24  - see procedure note - Eylea approved for 2025 - f/u 10-11 wks -- DFE/OCT, possible injection  2,3. Hypertensive retinopathy OU - discussed  importance of tight BP control - monitor  4. Pseudophakia OU  - s/p CE/IOL OU (Dr. Lelon)  - IOL in good position, doing well  - monitor  Ophthalmic Meds Ordered this visit:  Meds ordered this encounter  Medications   Bevacizumab  (AVASTIN ) SOLN 1.25 mg     Return for f/u 10-11 weeks, CRVO OD, DFE, OCT, Possible Injxn.  There are no Patient Instructions on file for this visit.   Explained the diagnoses, plan, and follow up with the patient and they expressed understanding.  Patient expressed understanding of the importance of proper follow up care.   This document serves as a record of services personally performed by Redell JUDITHANN Hans, MD, PhD. It was created on their behalf by Alan PARAS. Delores, OA an ophthalmic technician. The creation of this record is the provider's dictation and/or activities during the  visit.    Electronically signed by: Alan PARAS. Delores, OA 10/20/23 11:06 PM  Redell JUDITHANN Hans, M.D., Ph.D. Diseases & Surgery of the Retina and Vitreous [facility]  I have reviewed the above documentation for accuracy and completeness, and I agree with the above. Redell JUDITHANN Hans, M.D., Ph.D. 10/20/23 11:08 PM   Abbreviations: M myopia (nearsighted); A astigmatism; H hyperopia (farsighted); P presbyopia; Mrx spectacle prescription;  CTL contact lenses; OD right eye; OS left eye; OU both eyes  XT exotropia; ET esotropia; PEK punctate epithelial keratitis; PEE punctate epithelial erosions; DES dry eye syndrome; MGD meibomian gland dysfunction; ATs artificial tears; PFAT's preservative free artificial tears; NSC nuclear sclerotic cataract; PSC posterior subcapsular cataract; ERM epi-retinal membrane; PVD posterior vitreous detachment; RD retinal detachment; DM diabetes mellitus; DR diabetic retinopathy; NPDR non-proliferative diabetic retinopathy; PDR proliferative diabetic retinopathy; CSME clinically significant macular edema; DME diabetic macular edema; dbh dot  blot hemorrhages; CWS cotton wool spot; POAG primary open angle glaucoma; C/D cup-to-disc ratio; HVF humphrey visual field; GVF goldmann visual field; OCT optical coherence tomography; IOP intraocular pressure; BRVO Branch retinal vein occlusion; CRVO central retinal vein occlusion; CRAO central retinal artery occlusion; BRAO branch retinal artery occlusion; RT retinal tear; SB scleral buckle; PPV pars plana vitrectomy; VH Vitreous hemorrhage; PRP panretinal laser photocoagulation; IVK intravitreal kenalog; VMT vitreomacular traction; MH Macular hole;  NVD neovascularization of the disc; NVE neovascularization elsewhere; AREDS age related eye disease study; ARMD age related macular degeneration; POAG primary open angle glaucoma; EBMD epithelial/anterior basement membrane dystrophy; ACIOL anterior chamber intraocular lens; IOL intraocular lens; PCIOL posterior chamber intraocular lens; Phaco/IOL phacoemulsification with intraocular lens placement; PRK photorefractive keratectomy; LASIK laser assisted in situ keratomileusis; HTN hypertension; DM diabetes mellitus; COPD chronic obstructive pulmonary disease

## 2023-10-29 DIAGNOSIS — M1990 Unspecified osteoarthritis, unspecified site: Secondary | ICD-10-CM | POA: Diagnosis not present

## 2023-10-29 DIAGNOSIS — Z79899 Other long term (current) drug therapy: Secondary | ICD-10-CM | POA: Diagnosis not present

## 2023-10-29 DIAGNOSIS — M0609 Rheumatoid arthritis without rheumatoid factor, multiple sites: Secondary | ICD-10-CM | POA: Diagnosis not present

## 2023-10-31 ENCOUNTER — Encounter (INDEPENDENT_AMBULATORY_CARE_PROVIDER_SITE_OTHER): Payer: Self-pay | Admitting: Otolaryngology

## 2023-10-31 ENCOUNTER — Ambulatory Visit (INDEPENDENT_AMBULATORY_CARE_PROVIDER_SITE_OTHER): Admitting: Otolaryngology

## 2023-10-31 VITALS — BP 143/84 | HR 74 | Ht 69.0 in | Wt 215.0 lb

## 2023-10-31 DIAGNOSIS — J328 Other chronic sinusitis: Secondary | ICD-10-CM | POA: Diagnosis not present

## 2023-10-31 DIAGNOSIS — J3489 Other specified disorders of nose and nasal sinuses: Secondary | ICD-10-CM

## 2023-10-31 DIAGNOSIS — R040 Epistaxis: Secondary | ICD-10-CM

## 2023-10-31 DIAGNOSIS — J343 Hypertrophy of nasal turbinates: Secondary | ICD-10-CM

## 2023-10-31 DIAGNOSIS — J342 Deviated nasal septum: Secondary | ICD-10-CM | POA: Diagnosis not present

## 2023-10-31 MED ORDER — AMOXICILLIN-POT CLAVULANATE 875-125 MG PO TABS: 1.0000 | ORAL_TABLET | Freq: Two times a day (BID) | ORAL | 0 refills | Status: AC

## 2023-10-31 MED ORDER — PREDNISONE 10 MG PO TABS: ORAL_TABLET | ORAL | 0 refills | Status: AC

## 2023-10-31 NOTE — Patient Instructions (Signed)
 Take Prednisone  by mouth (PO) 30mg  x 6 days (3 pills in morning), then 20mg  x4 days (2 pills), then 10mg  x 4 days (1 pill), then stop. Start 3 days before surgery Take Augmentin  875 mg by mouth (PO) twice daily for 10 days; take with food, take probiotic or yogurt with it; start 3 days before surgery Use rinses in the nose before surgery if possible; use flonase  two sprays twice daily before surgery

## 2023-10-31 NOTE — Progress Notes (Signed)
 Dear Dr. Clarice, Here is my assessment for our mutual patient, Jorge [name]. Thank you for allowing me the opportunity to care for your patient. Please do not hesitate to contact me should you have any other questions. Sincerely, Dr. Eldora Blanch  Otolaryngology Clinic Note Referring provider: Dr. Clarice HPI:  Jorge [name] is a 78 y.o. male kindly referred by Dr. Clarice for evaluation of epistaxis and hemoptysis(?)  Initial visit (07/2023): Noted epistaxis he reports ongoing for about 2 years, worse after flonase  use. Mostly reports that he does not have any active nose bleeds but will blow dark red chunks first thing in morning. Always on left. Denies any problems with pain, facial numbness, discolored drainage, congestion, epiphora or neck masses or weight loss.  Tried mupirocin, did not work. Not using flonase  currently. Some PND.  Additionally, he also reports some red blood mixed with mucus intermittently first thing in the morning when he coughs vigorously. He has not had any other pulm sx including cough, congestion, SOB. No smoking history. No swallowing difficulty, ear pain. Has never seen pulmonologist.  He denies any AR symptoms. No prior nasal procedures. No liver dysfunction, some kidney dysfunction.   --------------------------------------------------------- 09/02/2023 No epistaxis since. He denies any sinonasal symptoms currently except for some left nasal pressure and some morning time nasal congestion. No rinse use. Denies any problems with pain, facial numbness, discolored drainage, epiphora or neck masses or weight loss.  We discussed his CT and MRI results.  --------------------------------------------------------- 10/31/2023 No epistaxis, continues some left nasal pressure and congestion. No facial pain, numbness, discolored drainage, or neck masses. No salty or metallic taste in mouth. We again had an extensive discussion re: FESS and management. He has not been  using rinses, using flonase  and astelin  intermittently.   H&N Surgery: Septoplasty (>40 years ago) Personal or FHx of bleeding dz or anesthesia difficulty: no  GLP-1: no AP/AC: ASA 81  Tobacco: denies. Alcohol: no significant use. PMHx: HLD, CAD, HTN, RA, DVT, Ablation for PVC, CKD  Independent Review of Additional Tests or Records:  Dr. Clarice (IM) referral notes Referral notes reviewed and uploaded or available in chart in media tab (04/04/2023): Noted epistaxis - left chunks of red from the nose since starting flonase ; no pain, no fevers; Rx: Mupirocin, flonase , Ref to ENT Labs in media tab (03/29/2023): CBC and BMP: WBC 5.8, Hgb 15.7, Plt 225; BUNCr 18/1.1 MRI Brain 04/23/2018 independently interpreted with respect to sinuses: left middle turbinate associated lesion but cuts thick so cannot definitively say; no obvious intracranial or orbit extension appreciated; no significant paranasal sinus disease noted otherwise on left or right   CT Sinus and MR Face 08/13/2023: on independent review appears to have left ethmoid opacification with FSOT narrowing; no erosion noted - mucocele? No obvious encephalocele but there is a 1x1 cm enhancing area which obstructs FSOT.   PMH/Meds/All/SocHx/FamHx/ROS:   Past Medical History:  Diagnosis Date   Arthritis    CAD (coronary artery disease)    PTCA diagonal 2001, residual 60% LAD /  Nuclear 2006, no ischemia   Carotid artery disease (HCC)    Doppler, October, 2012, 0-39% bilateral stenoses.   Diverticulitis    Dyslipidemia    Ejection fraction    EF normal,   Eye abnormalities 08-23-2011   right ryr injection for bleed done by dr elner, dr elner told pt ok to have surgery 09-03-2011   Family history of anesthesia complication    SISTER HAD MEMORY PROBLEMS POST OP   Nuclear  sclerotic cataract of left eye 09/23/2019   The nature of cataract was discussed with the patient as well as the elective nature of surgery. The patient was reassured that  surgery at a later date does not put the patient at risk for a worse outcome. It was emphasized that the need for surgery is dictated by the patient's quality of life as influenced by the cataract. Patient was instructed to maintain close follow up with their general eye    Preop cardiovascular exam    Patient needs cardiac clearance for hernia surgery May, 2013   Psoriatic arthritis (HCC)    2012   PVC's (premature ventricular contractions)    Frequent PVCs noted over the years., These always decrease with exercise. This is documented   Umbilical hernia 07/31/2011     Past Surgical History:  Procedure Laterality Date   ABLATION  06/09/2019   ANGIOPLASTY  12/1999 and 03/2000   CARDIAC CATHETERIZATION  2001   carpel tunnel  2009/2010   both wrists   COLONOSCOPY N/A 10/30/2013   Procedure: COLONOSCOPY;  Surgeon: Belvie JONETTA Just, MD;  Location: [facility] ENDOSCOPY;  Service: Endoscopy;  Laterality: N/A;   ESOPHAGOGASTRODUODENOSCOPY N/A 10/30/2013   Procedure: ESOPHAGOGASTRODUODENOSCOPY (EGD);  Surgeon: Belvie JONETTA Just, MD;  Location: [facility] ENDOSCOPY;  Service: Endoscopy;  Laterality: N/A;   INGUINAL HERNIA REPAIR  09/03/2011   Procedure: HERNIA REPAIR INGUINAL ADULT;  Surgeon: Donnice POUR. Belinda, MD;  Location: [facility] ORS;  Service: General;  Laterality: Left;  Left Ingunal Hernia Repair with Mesh   PVC ABLATION N/A 06/09/2019   Procedure: PVC ABLATION;  Surgeon: Waddell Danelle ORN, MD;  Location: [facility] INVASIVE CV LAB;  Service: Cardiovascular;  Laterality: N/A;   right knee meniscus repair  1990's   UMBILICAL HERNIA REPAIR  09/03/2011   Procedure: HERNIA REPAIR UMBILICAL ADULT;  Surgeon: Donnice POUR. Tsuei, MD;  Location: [facility] ORS;  Service: General;  Laterality: N/A;    Family History  Problem Relation Age of Onset   Cancer Mother        uterine   Cancer Sister        Breast   Crohn's disease Cousin        distant   Colon cancer Neg Hx    Ulcerative colitis Neg Hx      Social Connections: Not on file       Current Outpatient Medications:    amoxicillin -clavulanate (AUGMENTIN ) 875-125 MG tablet, Take 1 tablet by mouth 2 (two) times daily for 10 days., Disp: 20 tablet, Rfl: 0   aspirin  EC 81 MG tablet, Take 81 mg by mouth daily., Disp: , Rfl:    atorvastatin  (LIPITOR) 80 MG tablet, Take 40 mg by mouth daily. , Disp: , Rfl:    azelastine  (ASTELIN ) 0.1 % nasal spray, Place 2 sprays into both nostrils 2 (two) times daily. Use in each nostril as directed, Disp: 30 mL, Rfl: 12   ezetimibe  (ZETIA ) 10 MG tablet, Take 10 mg by mouth daily with breakfast., Disp: , Rfl:    fish oil-omega-3 fatty acids  1000 MG capsule, Take 2 g by mouth daily with breakfast. , Disp: , Rfl:    fluticasone  (FLONASE ) 50 MCG/ACT nasal spray, Place 2 sprays into both nostrils in the morning and at bedtime., Disp: 16 g, Rfl: 10   metoprolol  succinate (TOPROL -XL) 25 MG 24 hr tablet, TAKE 1/2 TABLET BY MOUTH EVERY DAY WITH BREAKFAST, Disp: 45 tablet, Rfl: 3   Multiple Vitamin (MULTIVITAMIN) capsule, Take 1 capsule by mouth daily.  ,  Disp: , Rfl:    MYRBETRIQ  50 MG TB24 tablet, Take 50 mg by mouth daily., Disp: , Rfl:    pantoprazole  (PROTONIX ) 40 MG tablet, Take 1 tablet (40 mg total) by mouth daily., Disp: 30 tablet, Rfl: 1   predniSONE  (DELTASONE ) 10 MG tablet, Take 3 tablets (30 mg total) by mouth daily with breakfast for 6 days, THEN 2 tablets (20 mg total) daily with breakfast for 4 days, THEN 1 tablet (10 mg total) daily with breakfast for 4 days., Disp: 30 tablet, Rfl: 0   promethazine -dextromethorphan (PROMETHAZINE -DM) 6.25-15 MG/5ML syrup, Take 5 mLs by mouth 4 (four) times daily as needed., Disp: 118 mL, Rfl: 0   Tofacitinib  Citrate 5 MG TABS, Take 5 mg by mouth daily., Disp: , Rfl:    amoxicillin  (AMOXIL ) 500 MG capsule, Take 1 capsule by mouth every 6 (six) hours. (Patient not taking: Reported on 10/31/2023), Disp: , Rfl:    oseltamivir  (TAMIFLU ) 75 MG capsule, Take 1 capsule (75 mg total) by mouth every 12 (twelve) hours.  (Patient not taking: Reported on 10/31/2023), Disp: 10 capsule, Rfl: 0   Physical Exam:   BP (!) 143/84 (BP Location: Right Arm, Patient Position: Sitting, Cuff Size: Large)   Pulse 74   Ht 5' 9 (1.753 m)   Wt 215 lb (97.5 kg)   SpO2 91%   BMI 31.75 kg/m   Salient findings:  CN II-XII intact Anterior rhinoscopy: Septum relatively midline; bilateral inferior turbinates without significant hypertrophy; Nasal endoscopy was indicated to better evaluate the nose and paranasal sinuses, given the patient's history and exam findings, and is detailed below. No lesions of oral cavity/oropharynx; no palatal numbness No obviously palpable neck masses/lymphadenopathy/thyromegaly No respiratory distress or stridor  Seprately Identifiable Procedures:  Prior to initiating any procedures, risks/benefits/alternatives were explained to the patient and verbal consent obtained.  PROCEDURE: Bilateral Diagnostic Rigid Nasal Endoscopy Pre-procedure diagnosis: Epistaxis, left nasal mass, left chronic sinusitis Post-procedure diagnosis: same Indication: See pre-procedure diagnosis and physical exam above Complications: None apparent EBL: 0 mL Anesthesia: Lidocaine  4% and topical decongestant was topically sprayed in each nasal cavity  Description of Procedure:  Patient was identified. A rigid 30 degree endoscope was utilized to evaluate the sinonasal cavities, mucosa, sinus ostia and turbinates and septum.  Overall, signs of mucosal inflammation are mild, slightly improved - the left nasal nodule/mass appears to be smaller in size without exposed bone or CSF. No prominent vessels .Unable to determine full extent of this and it is not friable, no significant crusting around it.  Right Middle meatus: clear Right SE Recess: clear Left MM: slight purulence Left SE Recess: clear  CPT CODE -- 68768 - Mod 25  Impression & Plans:  Trevonne Nyland is a 78 y.o. male on ASA 81 with:  1. Other chronic sinusitis    2. Nasal mass   3. Nasal septal deviation   4. Nasal obstruction   5. Hypertrophy of both inferior nasal turbinates   6. Epistaxis    Unclear if this is truly epistaxis v/s just bloody crusting. Epistaxis now resolved and mass appears to be slightly less prominent. Query if mucocele? Given comparison, do think this is more inflammatory in nature and he reports that he does have left congestion and pressure which is why he blows his nose so frequently, which may be precipitating the epistaxis. No right sided significant sx  We discussed benign v/s malignant etiologies for this, and next steps: bx, FESS/excision, observation. Would not rec observation given MRI findings/concern.  We discussed the goals of sinus surgery, and expectations for postoperative management. We discussed R/B/A including pain, infection, bleeding (~3% risk of operative visit for control), persistent symptoms, need for revision surgery, and other risks including damage to the eye and loss of vision, and injury to skull base with risk of CSF leak and additional intracranial complications (we specifically discussed this in case it is an encephalocele - unlikely), anesthetic complications, among others. Given his nasal congestion and obstruction, we also discussed septo/turbs. We discussed the goals of septoplasty and turbinate reduction, and expectations for postoperative management. Will plan to leave splints in place, and removal was also discussed. We also discussed nasal obstruction post-operatively until splints in place and pain management.  We discussed R/B/A including pain, infection, bleeding, persistent symptoms, need for revision surgery, and other risks including damage to surrounding structures, septal perforation, and injury to skull base with risk of CSF leak and additional intracranial complications, anesthetic complications, among others.  I stressed importance of regular rinses and post-operative management. He  is agreeable to this and wishes to proceed.  - Rec Flonase  and astelin  BID - Rec Daily sinus rinse - Periop abx/steroids as below - f/u POD 5  Stop ASA 81 7 days prior and for 7 days after - will get approval from Dr. Coni office/surgery clearance   See below regarding exact medications prescribed this encounter including dosages and route: Meds ordered this encounter  Medications   predniSONE  (DELTASONE ) 10 MG tablet    Sig: Take 3 tablets (30 mg total) by mouth daily with breakfast for 6 days, THEN 2 tablets (20 mg total) daily with breakfast for 4 days, THEN 1 tablet (10 mg total) daily with breakfast for 4 days.    Dispense:  30 tablet    Refill:  0   amoxicillin -clavulanate (AUGMENTIN ) 875-125 MG tablet    Sig: Take 1 tablet by mouth 2 (two) times daily for 10 days.    Dispense:  20 tablet    Refill:  0      Thank you for allowing me the opportunity to care for your patient. Please do not hesitate to contact me should you have any other questions.  Sincerely, Eldora Blanch, MD Otolaryngologist (ENT), [facility] Phone: [phone] Fax: [phone]  11/01/2023, 2:24 PM   I have personally spent 40 minutes involved in face-to-face and non-face-to-face activities for this patient on the day of the visit.  Professional time spent excludes any procedures performed but includes the following activities, in addition to those noted in the documentation: preparing to see the patient (review of outside documentation and results), performing a medically appropriate examination, counseling, ordering medications (prednisone , augmentin ), documenting in the electronic health record

## 2023-12-13 NOTE — Progress Notes (Signed)
 [facility] - Clinic Note  12/25/2023     CHIEF COMPLAINT Patient presents for Retina Follow Up   HISTORY OF PRESENT ILLNESS: Jorge [name] is a 78 y.o. male who presents to the clinic today for:   HPI     Retina Follow Up   Patient presents with  CRVO/BRVO.  In right eye.  This started 4 years ago.  Duration of 10 weeks.  Since onset it is stable.  I, the attending physician,  performed the HPI with the patient and updated documentation appropriately.        Comments   Pt denies any changes in visions/no FOL/floaters/pain. Pt does not use ats.      Last edited by Valdemar Rogue, MD on 12/25/2023 12:12 PM.    Patient states    Referring physician: Clarice Nottingham, MD [address] [place],  [place] [address]  HISTORICAL INFORMATION:   Selected notes from the MEDICAL RECORD NUMBER Pt transferring care from Dr. 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 (Ophthalmic Drugs)   No current facility-administered medications for this visit. (Ophthalmic Drugs)   Current Outpatient Medications (Other)  Medication Sig   amoxicillin  (AMOXIL ) 500 MG capsule Take 1 capsule by mouth every 6 (six) hours. (Patient not taking: Reported on 10/31/2023)   aspirin  EC 81 MG tablet Take 81 mg by mouth daily.   atorvastatin  (LIPITOR) 80 MG tablet Take 40 mg by mouth daily.    azelastine  (ASTELIN ) 0.1 % nasal spray Place 2 sprays into both nostrils 2 (two) times daily. Use in each nostril as directed   ezetimibe  (ZETIA ) 10 MG tablet Take 10 mg by mouth daily with breakfast.   fish oil-omega-3 fatty acids  1000 MG capsule Take 2 g by mouth daily with breakfast.    fluticasone  (FLONASE ) 50 MCG/ACT nasal spray Place 2 sprays into both nostrils in the morning and at bedtime.   metoprolol   succinate (TOPROL -XL) 25 MG 24 hr tablet TAKE 1/2 TABLET BY MOUTH EVERY DAY WITH BREAKFAST   Multiple Vitamin (MULTIVITAMIN) capsule Take 1 capsule by mouth daily.     MYRBETRIQ  50 MG TB24 tablet Take 50 mg by mouth daily.   oseltamivir  (TAMIFLU ) 75 MG capsule Take 1 capsule (75 mg total) by mouth every 12 (twelve) hours. (Patient not taking: Reported on 10/31/2023)   pantoprazole  (PROTONIX ) 40 MG tablet Take 1 tablet (40 mg total) by mouth daily.   promethazine -dextromethorphan (PROMETHAZINE -DM) 6.25-15 MG/5ML syrup Take 5 mLs by mouth 4 (four) times daily as needed.   Tofacitinib  Citrate 5 MG TABS Take 5 mg by mouth daily.   No current facility-administered medications for this visit. (Other)   REVIEW OF SYSTEMS: ROS   Positive for: Gastrointestinal, Cardiovascular, Eyes Negative for: Constitutional, Neurological, Skin, Genitourinary, Musculoskeletal, HENT, Endocrine, Respiratory, Psychiatric, Allergic/Imm, Heme/Lymph Last edited by Elnor Avelina RAMAN, COT on 12/25/2023  8:53 AM.        ALLERGIES Allergies  Allergen Reactions   Niacin Itching and Swelling   PAST MEDICAL HISTORY Past Medical History:  Diagnosis Date   Arthritis    CAD (coronary artery disease)    PTCA diagonal 2001, residual 60% LAD /  Nuclear 2006, no ischemia   Carotid artery disease    Doppler, October, 2012, 0-39% bilateral stenoses.  Diverticulitis    Dyslipidemia    Ejection fraction    EF normal,   Eye abnormalities 08-23-2011   right ryr injection for bleed done by dr elner, dr elner told pt ok to have surgery 09-03-2011   Family history of anesthesia complication    SISTER HAD MEMORY PROBLEMS POST OP   Nuclear sclerotic cataract of left eye 09/23/2019   The nature of cataract was discussed with the patient as well as the elective nature of surgery. The patient was reassured that surgery at a later date does not put the patient at risk for a worse outcome. It was emphasized that the need for surgery is  dictated by the patient's quality of life as influenced by the cataract. Patient was instructed to maintain close follow up with their general eye    Preop cardiovascular exam    Patient needs cardiac clearance for hernia surgery May, 2013   Psoriatic arthritis (HCC)    2012   PVC's (premature ventricular contractions)    Frequent PVCs noted over the years., These always decrease with exercise. This is documented   Umbilical hernia 07/31/2011   Past Surgical History:  Procedure Laterality Date   ABLATION  06/09/2019   ANGIOPLASTY  12/1999 and 03/2000   CARDIAC CATHETERIZATION  2001   carpel tunnel  2009/2010   both wrists   COLONOSCOPY N/A 10/30/2013   Procedure: COLONOSCOPY;  Surgeon: Belvie JONETTA Just, MD;  Location: [facility] ENDOSCOPY;  Service: Endoscopy;  Laterality: N/A;   ESOPHAGOGASTRODUODENOSCOPY N/A 10/30/2013   Procedure: ESOPHAGOGASTRODUODENOSCOPY (EGD);  Surgeon: Belvie JONETTA Just, MD;  Location: [facility] ENDOSCOPY;  Service: Endoscopy;  Laterality: N/A;   INGUINAL HERNIA REPAIR  09/03/2011   Procedure: HERNIA REPAIR INGUINAL ADULT;  Surgeon: Donnice POUR. Belinda, MD;  Location: [facility] ORS;  Service: General;  Laterality: Left;  Left Ingunal Hernia Repair with Mesh   PVC ABLATION N/A 06/09/2019   Procedure: PVC ABLATION;  Surgeon: Waddell Danelle ORN, MD;  Location: [facility] INVASIVE CV LAB;  Service: Cardiovascular;  Laterality: N/A;   right knee meniscus repair  1990's   UMBILICAL HERNIA REPAIR  09/03/2011   Procedure: HERNIA REPAIR UMBILICAL ADULT;  Surgeon: Donnice POUR. Belinda, MD;  Location: [facility] ORS;  Service: General;  Laterality: N/A;   FAMILY HISTORY Family History  Problem Relation Age of Onset   Cancer Mother        uterine   Cancer Sister        Breast   Crohn's disease Cousin        distant   Colon cancer Neg Hx    Ulcerative colitis Neg Hx    SOCIAL HISTORY Social History   Tobacco Use   Smoking status: Former    Current packs/day: 0.00    Average packs/day: 1.5 packs/day for 25.0 years  (37.5 ttl pk-yrs)    Types: Cigarettes    Start date: 12/07/1969    Quit date: 12/08/1994    Years since quitting: 29.0   Smokeless tobacco: Former  Building services engineer status: Never Used  Substance Use Topics   Alcohol use: Yes    Comment: 2 drink per day   Drug use: No       OPHTHALMIC EXAM:  Base Eye Exam     Visual Acuity (Snellen - Linear)       Right Left   Dist cc 20/25 -2 20/20 -2    Correction: Glasses         Tonometry (Tonopen, 8:49 AM)  Right Left   Pressure 19 15         Pupils       Pupils Dark Light Shape React APD   Right PERRL 3 2 Round Brisk None   Left PERRL 3 2 Round Brisk None         Visual Fields       Left Right    Full Full         Extraocular Movement       Right Left    Full, Ortho Full, Ortho         Neuro/Psych     Oriented x3: Yes   Mood/Affect: Normal         Dilation     Both eyes: 1.0% Mydriacyl, 2.5% Phenylephrine @ 8:51 AM           Slit Lamp and Fundus Exam     Slit Lamp Exam       Right Left   Lids/Lashes Dermatochalasis - upper lid Dermatochalasis - upper lid   Conjunctiva/Sclera nasal pingeucula Mild nasal and temporal pinguecula   Cornea arcus, well healed cataract wound, trace PEE, mild tear film debris, small sub epi scar at 0400 paracentral arcus, tear film debris, trace Punctate epithelial erosions, well healed cataract wound   Anterior Chamber deep and clear deep and clear   Iris Round and dilated Round and dilated   Lens PC IOL in good position PC IOL in good position   Anterior Vitreous Vitreous syneresis, +silicone micro bubbles, PVD Mild syeresis         Fundus Exam       Right Left   Posterior Vitreous Posterior vitreous detachment, vitreous condensations, Weiss ring Posterior vitreous detachment   Disc Pink and Sharp, vascular loops, patchy hyperemia, PPP temporally Pink and Sharp, mild PPA   C/D Ratio 0.2 0.3   Macula Flat, Blunted foveal reflex, trace cystic  changes, rare IRH Flat, Good foveal reflex, RPE mottling, No heme or edema   Vessels attenuated, Tortuous, old CRVO attenuated, Tortuous   Periphery Attached, No heme Attached, No heme           Refraction     Wearing Rx       Sphere Cylinder Axis Add   Right +0.00 +2.25 001 +2.00   Left -0.50 +2.25 165 +2.00           IMAGING AND PROCEDURES  Imaging and Procedures for 12/25/2023  OCT, Retina - OU - Both Eyes       Right Eye Quality was good. Scan locations included subfoveal. Central Foveal Thickness: 305. Progression has worsened. Findings include no SRF, abnormal foveal contour, intraretinal fluid (Persistent scattered cystic changes; slightly increased nasal fovea).   Left Eye Quality was good. Scan locations included subfoveal. Central Foveal Thickness: 278. Progression has been stable. Findings include normal foveal contour, no IRF, no SRF.   Notes *Images captured and stored on drive  Diagnosis / Impression:  OD: CRVO - Persistent scattered cystic changes; slightly increased nasal fovea OS: NFP, no IRF/SRF  Clinical management:  See below  Abbreviations: NFP - Normal foveal profile. CME - cystoid macular edema. PED - pigment epithelial detachment. IRF - intraretinal fluid. SRF - subretinal fluid. EZ - ellipsoid zone. ERM - epiretinal membrane. ORA - outer retinal atrophy. ORT - outer retinal tubulation. SRHM - subretinal hyper-reflective material. IRHM - intraretinal hyper-reflective material        Intravitreal Injection, Pharmacologic Agent - OD - Right Eye  Time Out 12/25/2023. 9:23 AM. Confirmed correct patient, procedure, site, and patient consented.   Anesthesia Topical anesthesia was used. Anesthetic medications included Lidocaine  2%, Proparacaine 0.5%.   Procedure Preparation included 5% betadine to ocular surface, eyelid speculum. A supplied (32g) needle was used.   Injection: 1.25 mg Bevacizumab  1.25mg /0.41ml   Route: Intravitreal,  Site: Right Eye   NDC: [id number], Lot: [id number], Expiration date: 01/05/2024   Post-op Post injection exam found visual acuity of at least counting fingers. The patient tolerated the procedure well. There were no complications. The patient received written and verbal post procedure care education.            ASSESSMENT/PLAN:   ICD-10-CM   1. Central retinal vein occlusion with macular edema of right eye (HCC)  H34.8110 OCT, Retina - OU - Both Eyes    Intravitreal Injection, Pharmacologic Agent - OD - Right Eye    Bevacizumab  (AVASTIN ) SOLN 1.25 mg    2. Essential hypertension  I10     3. Hypertensive retinopathy of both eyes  H35.033     4. Pseudophakia, both eyes  Z96.1      CRVO w/ CME, OD  - Dr. Elner pt here due to insurance reasons  - onset of CRVO ~8 yrs ago per pt report (~2015-2016) - history of multiple IVA OD -- on 14 wk maintenance schedule w/ [facility] - last injxn w [facility] -- IVA OD on 09.12.23 (18 wks prior to 1st visit/injxn here) - s/p IVA OD #1 (01.18.24), #2 (04.11.24), #3 (05.23.24), #4 (07.18.24), #5 (09.05.24), #6 (10.24.24), #7 (12.12.24), #8 (01.18.25), #9 (05.06.25), #10 (07.30.25)  **increase in IRF at 8 wks on 07.18.24 visit**  **increase in IRF at 12 wks on 07.30.25 visit**  - BCVA OD 20/25 stable - OCT shows Interval increase in scattered cystic changes greatest nasal fovea at 10 wks - recommend IVA OD #11 today, 10.08.25 w/ f/u in 10 wks  - pt wishes to proceed with injection  - RBA of procedure discussed, questions answered - informed consent obtained and signed 01.18.24  - see procedure note - Eylea approved for 2025 - f/u 10 wks -- DFE/OCT, possible injection  2,3. Hypertensive retinopathy OU - discussed importance of tight BP control - monitor  4. Pseudophakia OU  - s/p CE/IOL OU (Dr. Lelon)  - IOL in good position, doing well  - monitor  Ophthalmic Meds Ordered this visit:  Meds ordered this encounter  Medications   Bevacizumab   (AVASTIN ) SOLN 1.25 mg     Return in about 10 weeks (around 03/04/2024) for f/u, CRVO, DFE, OCT, Possible, IVA, OD.  There are no Patient Instructions on file for this visit.   Explained the diagnoses, plan, and follow up with the patient and they expressed understanding.  Patient expressed understanding of the importance of proper follow up care.   This document serves as a record of services personally performed by Redell JUDITHANN Hans, MD, PhD. It was created on their behalf by Almetta Pesa, an ophthalmic technician. The creation of this record is the provider's dictation and/or activities during the visit.    Electronically signed by: Almetta Pesa, OA, 12/25/23  12:12 PM  This document serves as a record of services personally performed by Redell JUDITHANN Hans, MD, PhD. It was created on their behalf by Wanda GEANNIE Keens, COT an ophthalmic technician. The creation of this record is the provider's dictation and/or activities during the visit.    Electronically signed by:  Wanda GEANNIE Keens, COT  12/25/23 12:12 PM  Redell JUDITHANN Hans, M.D., Ph.D. Diseases & Surgery of the Retina and Vitreous [facility]  I have reviewed the above documentation for accuracy and completeness, and I agree with the above. Redell JUDITHANN Hans, M.D., Ph.D. 12/25/23 12:13 PM    Abbreviations: M myopia (nearsighted); A astigmatism; H hyperopia (farsighted); P presbyopia; Mrx spectacle prescription;  CTL contact lenses; OD right eye; OS left eye; OU both eyes  XT exotropia; ET esotropia; PEK punctate epithelial keratitis; PEE punctate epithelial erosions; DES dry eye syndrome; MGD meibomian gland dysfunction; ATs artificial tears; PFAT's preservative free artificial tears; NSC nuclear sclerotic cataract; PSC posterior subcapsular cataract; ERM epi-retinal membrane; PVD posterior vitreous detachment; RD retinal detachment; DM diabetes mellitus; DR diabetic retinopathy; NPDR non-proliferative  diabetic retinopathy; PDR proliferative diabetic retinopathy; CSME clinically significant macular edema; DME diabetic macular edema; dbh dot blot hemorrhages; CWS cotton wool spot; POAG primary open angle glaucoma; C/D cup-to-disc ratio; HVF humphrey visual field; GVF goldmann visual field; OCT optical coherence tomography; IOP intraocular pressure; BRVO Branch retinal vein occlusion; CRVO central retinal vein occlusion; CRAO central retinal artery occlusion; BRAO branch retinal artery occlusion; RT retinal tear; SB scleral buckle; PPV pars plana vitrectomy; VH Vitreous hemorrhage; PRP panretinal laser photocoagulation; IVK intravitreal kenalog; VMT vitreomacular traction; MH Macular hole;  NVD neovascularization of the disc; NVE neovascularization elsewhere; AREDS age related eye disease study; ARMD age related macular degeneration; POAG primary open angle glaucoma; EBMD epithelial/anterior basement membrane dystrophy; ACIOL anterior chamber intraocular lens; IOL intraocular lens; PCIOL posterior chamber intraocular lens; Phaco/IOL phacoemulsification with intraocular lens placement; PRK photorefractive keratectomy; LASIK laser assisted in situ keratomileusis; HTN hypertension; DM diabetes mellitus; COPD chronic obstructive pulmonary disease

## 2023-12-25 ENCOUNTER — Ambulatory Visit (INDEPENDENT_AMBULATORY_CARE_PROVIDER_SITE_OTHER): Admitting: Ophthalmology

## 2023-12-25 ENCOUNTER — Encounter (INDEPENDENT_AMBULATORY_CARE_PROVIDER_SITE_OTHER): Payer: Self-pay | Admitting: Ophthalmology

## 2023-12-25 DIAGNOSIS — H34811 Central retinal vein occlusion, right eye, with macular edema: Secondary | ICD-10-CM

## 2023-12-25 DIAGNOSIS — Z961 Presence of intraocular lens: Secondary | ICD-10-CM

## 2023-12-25 DIAGNOSIS — H35033 Hypertensive retinopathy, bilateral: Secondary | ICD-10-CM

## 2023-12-25 DIAGNOSIS — I10 Essential (primary) hypertension: Secondary | ICD-10-CM | POA: Diagnosis not present

## 2023-12-25 MED ORDER — BEVACIZUMAB CHEMO INJECTION 1.25MG/0.05ML SYRINGE FOR KALEIDOSCOPE
1.2500 mg | INTRAVITREAL | Status: AC | PRN
  Administered 2023-12-25: 1.25 mg via INTRAVITREAL

## 2024-01-14 DIAGNOSIS — S0083XA Contusion of other part of head, initial encounter: Secondary | ICD-10-CM | POA: Diagnosis not present

## 2024-02-27 NOTE — Progress Notes (Signed)
 [facility] - Clinic Note  03/04/2024     CHIEF COMPLAINT Patient presents for Retina Follow Up   HISTORY OF PRESENT ILLNESS: Jorge [name] is a 78 y.o. male who presents to the clinic today for:   HPI     Retina Follow Up   Patient presents with  CRVO/BRVO.  In right eye.  Severity is moderate.  Duration of 10 weeks.  Since onset it is stable.        Comments   10 week Retina eval. Patient states no changes noticed      Last edited by German Olam BRAVO, COT on 03/04/2024  8:35 AM.     Patient states   Referring physician: Clarice Nottingham, MD [address] [place],  [place] [address]  HISTORICAL INFORMATION:   Selected notes from the MEDICAL RECORD NUMBER Pt transferring care from Dr. 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 (Ophthalmic Drugs)   No current facility-administered medications for this visit. (Ophthalmic Drugs)   Current Outpatient Medications (Other)  Medication Sig   aspirin  EC 81 MG tablet Take 81 mg by mouth daily.   atorvastatin  (LIPITOR) 80 MG tablet Take 40 mg by mouth daily.    azelastine  (ASTELIN ) 0.1 % nasal spray Place 2 sprays into both nostrils 2 (two) times daily. Use in each nostril as directed   ezetimibe  (ZETIA ) 10 MG tablet Take 10 mg by mouth daily with breakfast.   fish oil-omega-3 fatty acids  1000 MG capsule Take 2 g by mouth daily with breakfast.    fluticasone  (FLONASE ) 50 MCG/ACT nasal spray Place 2 sprays into both nostrils in the morning and at bedtime.   metoprolol  succinate (TOPROL -XL) 25 MG 24 hr tablet TAKE 1/2 TABLET BY MOUTH EVERY DAY WITH BREAKFAST   Multiple Vitamin (MULTIVITAMIN) capsule Take 1 capsule by mouth daily.     pantoprazole  (PROTONIX ) 40 MG tablet Take 1 tablet (40 mg total) by mouth daily.    promethazine -dextromethorphan (PROMETHAZINE -DM) 6.25-15 MG/5ML syrup Take 5 mLs by mouth 4 (four) times daily as needed.   Tofacitinib  Citrate 5 MG TABS Take 5 mg by mouth daily.   amoxicillin  (AMOXIL ) 500 MG capsule Take 1 capsule by mouth every 6 (six) hours. (Patient not taking: Reported on 10/31/2023)   MYRBETRIQ  50 MG TB24 tablet Take 50 mg by mouth daily.   oseltamivir  (TAMIFLU ) 75 MG capsule Take 1 capsule (75 mg total) by mouth every 12 (twelve) hours. (Patient not taking: Reported on 10/31/2023)   No current facility-administered medications for this visit. (Other)   REVIEW OF SYSTEMS: ROS   Positive for: Gastrointestinal, Cardiovascular, Eyes Negative for: Constitutional, Neurological, Skin, Genitourinary, Musculoskeletal, HENT, Endocrine, Respiratory, Psychiatric, Allergic/Imm, Heme/Lymph Last edited by German Olam BRAVO, COT on 03/04/2024  8:32 AM.         ALLERGIES Allergies  Allergen Reactions   Niacin Itching and Swelling   PAST MEDICAL HISTORY Past Medical History:  Diagnosis Date   Arthritis    CAD (coronary artery disease)    PTCA diagonal 2001, residual 60% LAD /  Nuclear 2006, no ischemia   Carotid artery disease    Doppler, October, 2012, 0-39% bilateral stenoses.   Diverticulitis    Dyslipidemia    Ejection fraction    EF normal,   Eye  abnormalities 08-23-2011   right ryr injection for bleed done by dr elner, dr elner told pt ok to have surgery 09-03-2011   Family history of anesthesia complication    SISTER HAD MEMORY PROBLEMS POST OP   Nuclear sclerotic cataract of left eye 09/23/2019   The nature of cataract was discussed with the patient as well as the elective nature of surgery. The patient was reassured that surgery at a later date does not put the patient at risk for a worse outcome. It was emphasized that the need for surgery is dictated by the patient's quality of life as influenced by the cataract. Patient was instructed to maintain close follow up  with their general eye    Preop cardiovascular exam    Patient needs cardiac clearance for hernia surgery May, 2013   Psoriatic arthritis (HCC)    2012   PVC's (premature ventricular contractions)    Frequent PVCs noted over the years., These always decrease with exercise. This is documented   Umbilical hernia 07/31/2011   Past Surgical History:  Procedure Laterality Date   ABLATION  06/09/2019   ANGIOPLASTY  12/1999 and 03/2000   CARDIAC CATHETERIZATION  2001   carpel tunnel  2009/2010   both wrists   COLONOSCOPY N/A 10/30/2013   Procedure: COLONOSCOPY;  Surgeon: Belvie JONETTA Just, MD;  Location: [facility] ENDOSCOPY;  Service: Endoscopy;  Laterality: N/A;   ESOPHAGOGASTRODUODENOSCOPY N/A 10/30/2013   Procedure: ESOPHAGOGASTRODUODENOSCOPY (EGD);  Surgeon: Belvie JONETTA Just, MD;  Location: [facility] ENDOSCOPY;  Service: Endoscopy;  Laterality: N/A;   INGUINAL HERNIA REPAIR  09/03/2011   Procedure: HERNIA REPAIR INGUINAL ADULT;  Surgeon: Donnice POUR. Belinda, MD;  Location: [facility] ORS;  Service: General;  Laterality: Left;  Left Ingunal Hernia Repair with Mesh   PVC ABLATION N/A 06/09/2019   Procedure: PVC ABLATION;  Surgeon: Waddell Danelle ORN, MD;  Location: [facility] INVASIVE CV LAB;  Service: Cardiovascular;  Laterality: N/A;   right knee meniscus repair  1990's   UMBILICAL HERNIA REPAIR  09/03/2011   Procedure: HERNIA REPAIR UMBILICAL ADULT;  Surgeon: Donnice POUR. Belinda, MD;  Location: [facility] ORS;  Service: General;  Laterality: N/A;   FAMILY HISTORY Family History  Problem Relation Age of Onset   Cancer Mother        uterine   Cancer Sister        Breast   Crohn's disease Cousin        distant   Colon cancer Neg Hx    Ulcerative colitis Neg Hx    SOCIAL HISTORY Social History   Tobacco Use   Smoking status: Former    Current packs/day: 0.00    Average packs/day: 1.5 packs/day for 25.0 years (37.5 ttl pk-yrs)    Types: Cigarettes    Start date: 12/07/1969    Quit date: 12/08/1994    Years since quitting: 29.2    Smokeless tobacco: Former  Building Services Engineer status: Never Used  Substance Use Topics   Alcohol use: Yes    Comment: 2 drink per day   Drug use: No       OPHTHALMIC EXAM:  Base Eye Exam     Visual Acuity (Snellen - Linear)       Right Left   Dist cc 20/25 -2 20/20 -2   Dist ph cc 20/NI 20/NI    Correction: Glasses         Tonometry (Tonopen, 8:34 AM)       Right Left   Pressure 11 10  Pupils       Dark Light Shape React APD   Right 3 2 Round Brisk None   Left 3 2 Round Brisk None         Visual Fields (Counting fingers)       Left Right    Full Full         Extraocular Movement       Right Left    Full, Ortho Full, Ortho         Neuro/Psych     Oriented x3: Yes   Mood/Affect: Normal         Dilation     Both eyes: 1.0% Mydriacyl, 2.5% Phenylephrine @ 8:34 AM           Slit Lamp and Fundus Exam     Slit Lamp Exam       Right Left   Lids/Lashes Dermatochalasis - upper lid Dermatochalasis - upper lid   Conjunctiva/Sclera nasal pingeucula Mild nasal and temporal pinguecula   Cornea arcus, well healed cataract wound, trace PEE, mild tear film debris, small sub epi scar at 0400 paracentral arcus, tear film debris, trace Punctate epithelial erosions, well healed cataract wound   Anterior Chamber deep and clear deep and clear   Iris Round and dilated Round and dilated   Lens PC IOL in good position PC IOL in good position   Anterior Vitreous Vitreous syneresis, +silicone micro bubbles, PVD Mild syeresis         Fundus Exam       Right Left   Posterior Vitreous Posterior vitreous detachment, vitreous condensations, Weiss ring Posterior vitreous detachment   Disc Pink and Sharp, vascular loops, patchy hyperemia, PPP temporally Pink and Sharp, mild PPA   C/D Ratio 0.2 0.3   Macula Flat, Blunted foveal reflex, trace cystic changes, rare IRH Flat, Good foveal reflex, RPE mottling, No heme or edema   Vessels attenuated,  Tortuous, old CRVO attenuated, Tortuous   Periphery Attached, No heme Attached, No heme           Refraction     Wearing Rx       Sphere Cylinder Axis Add   Right +0.00 +2.25 001 +2.00   Left -0.50 +2.25 165 +2.00           IMAGING AND PROCEDURES  Imaging and Procedures for 03/04/2024          ASSESSMENT/PLAN:   ICD-10-CM   1. Central retinal vein occlusion with macular edema of right eye (HCC)  H34.8110 OCT, Retina - OU - Both Eyes    2. Essential hypertension  I10     3. Hypertensive retinopathy of both eyes  H35.033     4. Pseudophakia, both eyes  Z96.1      CRVO w/ CME, OD  - Dr. Elner pt here due to insurance reasons  - onset of CRVO ~8 yrs ago per pt report (~2015-2016) - history of multiple IVA OD -- on 14 wk maintenance schedule w/ [facility] - last injxn w [facility] -- IVA OD on 09.12.23 (18 wks prior to 1st visit/injxn here) - s/p IVA OD #1 (01.18.24), #2 (04.11.24), #3 (05.23.24), #4 (07.18.24), #5 (09.05.24), #6 (10.24.24), #7 (12.12.24), #8 (01.18.25), #9 (05.06.25), #10 (07.30.25), #11 (10.08.25)  **increase in IRF at 8 wks on 07.18.24 visit**  **increase in IRF at 12 wks on 07.30.25 visit**  - BCVA OD 20/25 stable - OCT shows Interval resolution of scattered cystic changes, no fluid at 10 wks - recommend  IVA OD #12 today, 12.17.25 w/ f/u in 11 wks  - pt wishes to proceed with injection  - RBA of procedure discussed, questions answered - informed consent obtained and signed 01.18.24  - see procedure note - Eylea approved for 2025 - f/u 11 wks -- DFE/OCT, possible injection  2,3. Hypertensive retinopathy OU - discussed importance of tight BP control - monitor  4. Pseudophakia OU  - s/p CE/IOL OU (Dr. Lelon)  - IOL in good position, doing well  - monitor  Ophthalmic Meds Ordered this visit:  No orders of the defined types were placed in this encounter.    No follow-ups on file.  There are no Patient Instructions on file for this  visit.   Explained the diagnoses, plan, and follow up with the patient and they expressed understanding.  Patient expressed understanding of the importance of proper follow up care.   This document serves as a record of services personally performed by Redell JUDITHANN Hans, MD, PhD. It was created on their behalf by Almetta Pesa, an ophthalmic technician. The creation of this record is the provider's dictation and/or activities during the visit.    Electronically signed by: Almetta Pesa, OA, 03/04/2024  9:32 AM  This document serves as a record of services personally performed by Redell JUDITHANN Hans, MD, PhD. It was created on their behalf by Wanda GEANNIE Keens, COT an ophthalmic technician. The creation of this record is the provider's dictation and/or activities during the visit.    Electronically signed by:  Wanda GEANNIE Keens, COT  03/04/2024 9:32 AM  Redell JUDITHANN Hans, M.D., Ph.D. Diseases & Surgery of the Retina and Vitreous [facility]    Abbreviations: M myopia (nearsighted); A astigmatism; H hyperopia (farsighted); P presbyopia; Mrx spectacle prescription;  CTL contact lenses; OD right eye; OS left eye; OU both eyes  XT exotropia; ET esotropia; PEK punctate epithelial keratitis; PEE punctate epithelial erosions; DES dry eye syndrome; MGD meibomian gland dysfunction; ATs artificial tears; PFAT's preservative free artificial tears; NSC nuclear sclerotic cataract; PSC posterior subcapsular cataract; ERM epi-retinal membrane; PVD posterior vitreous detachment; RD retinal detachment; DM diabetes mellitus; DR diabetic retinopathy; NPDR non-proliferative diabetic retinopathy; PDR proliferative diabetic retinopathy; CSME clinically significant macular edema; DME diabetic macular edema; dbh dot blot hemorrhages; CWS cotton wool spot; POAG primary open angle glaucoma; C/D cup-to-disc ratio; HVF humphrey visual field; GVF goldmann visual field; OCT optical coherence  tomography; IOP intraocular pressure; BRVO Branch retinal vein occlusion; CRVO central retinal vein occlusion; CRAO central retinal artery occlusion; BRAO branch retinal artery occlusion; RT retinal tear; SB scleral buckle; PPV pars plana vitrectomy; VH Vitreous hemorrhage; PRP panretinal laser photocoagulation; IVK intravitreal kenalog; VMT vitreomacular traction; MH Macular hole;  NVD neovascularization of the disc; NVE neovascularization elsewhere; AREDS age related eye disease study; ARMD age related macular degeneration; POAG primary open angle glaucoma; EBMD epithelial/anterior basement membrane dystrophy; ACIOL anterior chamber intraocular lens; IOL intraocular lens; PCIOL posterior chamber intraocular lens; Phaco/IOL phacoemulsification with intraocular lens placement; PRK photorefractive keratectomy; LASIK laser assisted in situ keratomileusis; HTN hypertension; DM diabetes mellitus; COPD chronic obstructive pulmonary disease

## 2024-03-04 ENCOUNTER — Encounter (INDEPENDENT_AMBULATORY_CARE_PROVIDER_SITE_OTHER): Payer: Self-pay | Admitting: Ophthalmology

## 2024-03-04 ENCOUNTER — Ambulatory Visit (INDEPENDENT_AMBULATORY_CARE_PROVIDER_SITE_OTHER): Admitting: Ophthalmology

## 2024-03-04 DIAGNOSIS — H34811 Central retinal vein occlusion, right eye, with macular edema: Secondary | ICD-10-CM | POA: Diagnosis not present

## 2024-03-04 DIAGNOSIS — I10 Essential (primary) hypertension: Secondary | ICD-10-CM | POA: Diagnosis not present

## 2024-03-04 DIAGNOSIS — Z961 Presence of intraocular lens: Secondary | ICD-10-CM

## 2024-03-04 DIAGNOSIS — H35033 Hypertensive retinopathy, bilateral: Secondary | ICD-10-CM

## 2024-03-04 MED ORDER — BEVACIZUMAB CHEMO INJECTION 1.25MG/0.05ML SYRINGE FOR KALEIDOSCOPE
1.2500 mg | INTRAVITREAL | Status: AC | PRN
  Administered 2024-03-04: 18:00:00 1.25 mg via INTRAVITREAL

## 2024-05-20 ENCOUNTER — Encounter (INDEPENDENT_AMBULATORY_CARE_PROVIDER_SITE_OTHER): Admitting: Ophthalmology
